# Patient Record
Sex: Male | Born: 1945 | Race: White | Hispanic: No | Marital: Married | Smoking: Former smoker
Health system: Southern US, Community
[De-identification: ages and names within clinical notes are randomized; demographics above are authoritative.]

## PROBLEM LIST (undated history)

## (undated) DIAGNOSIS — E785 Hyperlipidemia, unspecified: Secondary | ICD-10-CM

## (undated) DIAGNOSIS — F32A Depression, unspecified: Secondary | ICD-10-CM

## (undated) DIAGNOSIS — K219 Gastro-esophageal reflux disease without esophagitis: Secondary | ICD-10-CM

## (undated) DIAGNOSIS — J45909 Unspecified asthma, uncomplicated: Secondary | ICD-10-CM

## (undated) DIAGNOSIS — K449 Diaphragmatic hernia without obstruction or gangrene: Secondary | ICD-10-CM

## (undated) DIAGNOSIS — Z86010 Personal history of colonic polyps: Secondary | ICD-10-CM

## (undated) DIAGNOSIS — K589 Irritable bowel syndrome without diarrhea: Secondary | ICD-10-CM

## (undated) DIAGNOSIS — M797 Fibromyalgia: Secondary | ICD-10-CM

## (undated) DIAGNOSIS — Z8669 Personal history of other diseases of the nervous system and sense organs: Secondary | ICD-10-CM

## (undated) DIAGNOSIS — R011 Cardiac murmur, unspecified: Secondary | ICD-10-CM

## (undated) DIAGNOSIS — F419 Anxiety disorder, unspecified: Secondary | ICD-10-CM

## (undated) DIAGNOSIS — M1990 Unspecified osteoarthritis, unspecified site: Secondary | ICD-10-CM

## (undated) DIAGNOSIS — I10 Essential (primary) hypertension: Secondary | ICD-10-CM

## (undated) DIAGNOSIS — H269 Unspecified cataract: Secondary | ICD-10-CM

## (undated) DIAGNOSIS — G510 Bell's palsy: Secondary | ICD-10-CM

## (undated) DIAGNOSIS — F329 Major depressive disorder, single episode, unspecified: Secondary | ICD-10-CM

## (undated) HISTORY — DX: Essential (primary) hypertension: I10

## (undated) HISTORY — DX: Unspecified asthma, uncomplicated: J45.909

## (undated) HISTORY — DX: Gastro-esophageal reflux disease without esophagitis: K21.9

## (undated) HISTORY — DX: Personal history of colonic polyps: Z86.010

## (undated) HISTORY — DX: Anxiety disorder, unspecified: F41.9

## (undated) HISTORY — PX: CHOLECYSTECTOMY: SHX55

## (undated) HISTORY — PX: OTHER SURGICAL HISTORY: SHX169

## (undated) HISTORY — DX: Irritable bowel syndrome, unspecified: K58.9

## (undated) HISTORY — DX: Personal history of other diseases of the nervous system and sense organs: Z86.69

## (undated) HISTORY — DX: Unspecified osteoarthritis, unspecified site: M19.90

## (undated) HISTORY — DX: Hyperlipidemia, unspecified: E78.5

## (undated) HISTORY — DX: Fibromyalgia: M79.7

## (undated) HISTORY — DX: Bell's palsy: G51.0

## (undated) HISTORY — DX: Diaphragmatic hernia without obstruction or gangrene: K44.9

## (undated) HISTORY — DX: Depression, unspecified: F32.A

## (undated) HISTORY — DX: Unspecified cataract: H26.9

---
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
## 1999-08-13 ENCOUNTER — Encounter (INDEPENDENT_AMBULATORY_CARE_PROVIDER_SITE_OTHER): Payer: Self-pay | Admitting: Internal Medicine

## 1999-08-13 LAB — CONVERTED CEMR LAB: PSA: 0.7 ng/mL

## 2000-10-10 ENCOUNTER — Encounter (INDEPENDENT_AMBULATORY_CARE_PROVIDER_SITE_OTHER): Payer: Self-pay | Admitting: Internal Medicine

## 2000-10-10 LAB — CONVERTED CEMR LAB: PSA: 0.6 ng/mL

## 2002-07-12 DIAGNOSIS — Z860101 Personal history of adenomatous and serrated colon polyps: Secondary | ICD-10-CM

## 2002-07-12 DIAGNOSIS — Z86010 Personal history of colonic polyps: Secondary | ICD-10-CM

## 2002-07-12 HISTORY — DX: Personal history of adenomatous and serrated colon polyps: Z86.0101

## 2002-07-12 HISTORY — DX: Personal history of colonic polyps: Z86.010

## 2004-02-10 ENCOUNTER — Encounter (INDEPENDENT_AMBULATORY_CARE_PROVIDER_SITE_OTHER): Payer: Self-pay | Admitting: Internal Medicine

## 2004-02-10 LAB — CONVERTED CEMR LAB: PSA: 0.9 ng/mL

## 2004-05-18 ENCOUNTER — Ambulatory Visit: Payer: Self-pay | Admitting: Family Medicine

## 2004-09-16 ENCOUNTER — Ambulatory Visit: Payer: Self-pay | Admitting: Family Medicine

## 2004-10-30 ENCOUNTER — Ambulatory Visit: Payer: Self-pay | Admitting: Family Medicine

## 2004-11-02 ENCOUNTER — Ambulatory Visit: Payer: Self-pay | Admitting: Family Medicine

## 2004-11-09 ENCOUNTER — Ambulatory Visit: Payer: Self-pay | Admitting: Family Medicine

## 2005-06-11 ENCOUNTER — Encounter (INDEPENDENT_AMBULATORY_CARE_PROVIDER_SITE_OTHER): Payer: Self-pay | Admitting: Internal Medicine

## 2005-06-11 LAB — CONVERTED CEMR LAB: PSA: 1.12 ng/mL

## 2005-06-23 ENCOUNTER — Ambulatory Visit: Payer: Self-pay | Admitting: Family Medicine

## 2005-06-25 ENCOUNTER — Ambulatory Visit: Payer: Self-pay | Admitting: Family Medicine

## 2005-07-28 ENCOUNTER — Ambulatory Visit: Payer: Self-pay | Admitting: Family Medicine

## 2005-08-25 ENCOUNTER — Ambulatory Visit: Payer: Self-pay | Admitting: Family Medicine

## 2005-09-17 ENCOUNTER — Ambulatory Visit: Payer: Self-pay | Admitting: Internal Medicine

## 2005-09-23 ENCOUNTER — Ambulatory Visit: Payer: Self-pay | Admitting: Otolaryngology

## 2006-03-21 ENCOUNTER — Ambulatory Visit: Payer: Self-pay | Admitting: Family Medicine

## 2006-03-30 ENCOUNTER — Ambulatory Visit: Payer: Self-pay | Admitting: Family Medicine

## 2006-04-11 ENCOUNTER — Ambulatory Visit: Payer: Self-pay | Admitting: Internal Medicine

## 2006-07-20 ENCOUNTER — Ambulatory Visit: Payer: Self-pay | Admitting: Family Medicine

## 2006-09-10 ENCOUNTER — Encounter (INDEPENDENT_AMBULATORY_CARE_PROVIDER_SITE_OTHER): Payer: Self-pay | Admitting: Internal Medicine

## 2006-09-10 LAB — CONVERTED CEMR LAB: PSA: NORMAL ng/mL

## 2006-09-12 ENCOUNTER — Ambulatory Visit: Payer: Self-pay | Admitting: Family Medicine

## 2006-09-20 ENCOUNTER — Ambulatory Visit: Payer: Self-pay | Admitting: Family Medicine

## 2006-09-21 ENCOUNTER — Ambulatory Visit: Payer: Self-pay | Admitting: Family Medicine

## 2006-09-21 LAB — CONVERTED CEMR LAB
Calcium: 9.3 mg/dL (ref 8.4–10.5)
Chloride: 102 meq/L (ref 96–112)
Creatinine, Ser: 0.8 mg/dL (ref 0.4–1.5)
GFR calc Af Amer: 126 mL/min
GFR calc non Af Amer: 104 mL/min
Potassium: 4.3 meq/L (ref 3.5–5.1)
Sodium: 141 meq/L (ref 135–145)

## 2006-09-27 ENCOUNTER — Encounter (INDEPENDENT_AMBULATORY_CARE_PROVIDER_SITE_OTHER): Payer: Self-pay | Admitting: Internal Medicine

## 2006-09-27 DIAGNOSIS — G510 Bell's palsy: Secondary | ICD-10-CM

## 2006-09-27 DIAGNOSIS — E785 Hyperlipidemia, unspecified: Secondary | ICD-10-CM

## 2006-09-27 DIAGNOSIS — G4733 Obstructive sleep apnea (adult) (pediatric): Secondary | ICD-10-CM | POA: Insufficient documentation

## 2006-09-27 DIAGNOSIS — M19049 Primary osteoarthritis, unspecified hand: Secondary | ICD-10-CM | POA: Insufficient documentation

## 2006-09-27 DIAGNOSIS — Z860100 Personal history of colon polyps, unspecified: Secondary | ICD-10-CM | POA: Insufficient documentation

## 2006-09-27 DIAGNOSIS — I10 Essential (primary) hypertension: Secondary | ICD-10-CM | POA: Insufficient documentation

## 2006-09-27 DIAGNOSIS — J309 Allergic rhinitis, unspecified: Secondary | ICD-10-CM | POA: Insufficient documentation

## 2006-09-27 DIAGNOSIS — F528 Other sexual dysfunction not due to a substance or known physiological condition: Secondary | ICD-10-CM | POA: Insufficient documentation

## 2006-09-27 DIAGNOSIS — G4730 Sleep apnea, unspecified: Secondary | ICD-10-CM | POA: Insufficient documentation

## 2006-09-27 DIAGNOSIS — K219 Gastro-esophageal reflux disease without esophagitis: Secondary | ICD-10-CM

## 2006-09-27 HISTORY — DX: Hyperlipidemia, unspecified: E78.5

## 2006-09-27 HISTORY — DX: Primary osteoarthritis, unspecified hand: M19.049

## 2006-09-27 HISTORY — DX: Obstructive sleep apnea (adult) (pediatric): G47.33

## 2006-10-10 ENCOUNTER — Ambulatory Visit: Payer: Self-pay | Admitting: Family Medicine

## 2007-02-02 ENCOUNTER — Encounter: Payer: Self-pay | Admitting: Physician Assistant

## 2007-02-02 ENCOUNTER — Ambulatory Visit: Payer: Self-pay | Admitting: Family Medicine

## 2007-02-02 DIAGNOSIS — M542 Cervicalgia: Secondary | ICD-10-CM

## 2007-02-02 DIAGNOSIS — M25519 Pain in unspecified shoulder: Secondary | ICD-10-CM

## 2007-06-02 ENCOUNTER — Ambulatory Visit: Payer: Self-pay | Admitting: Family Medicine

## 2007-06-05 ENCOUNTER — Encounter (INDEPENDENT_AMBULATORY_CARE_PROVIDER_SITE_OTHER): Payer: Self-pay | Admitting: Internal Medicine

## 2007-06-06 LAB — CONVERTED CEMR LAB
AST: 19 units/L (ref 0–37)
BUN: 12 mg/dL (ref 6–23)
CO2: 32 meq/L (ref 19–32)
Calcium: 9.6 mg/dL (ref 8.4–10.5)
Chloride: 105 meq/L (ref 96–112)
Creatinine, Ser: 0.8 mg/dL (ref 0.4–1.5)
Direct LDL: 163 mg/dL
HDL: 41.2 mg/dL (ref >39.0)

## 2007-06-27 ENCOUNTER — Ambulatory Visit: Payer: Self-pay | Admitting: Family Medicine

## 2007-06-27 DIAGNOSIS — L259 Unspecified contact dermatitis, unspecified cause: Secondary | ICD-10-CM | POA: Insufficient documentation

## 2007-07-19 ENCOUNTER — Ambulatory Visit: Payer: Self-pay | Admitting: Internal Medicine

## 2007-07-24 LAB — CONVERTED CEMR LAB
ALT: 18 units/L (ref 0–53)
AST: 20 units/L (ref 0–37)
Bilirubin, Direct: 0.1 mg/dL (ref 0.0–0.3)
Cholesterol: 171 mg/dL (ref 0–200)
HDL: 43.4 mg/dL (ref >39.0)
Total Protein: 6.9 g/dL (ref 6.0–8.3)

## 2007-07-28 ENCOUNTER — Ambulatory Visit: Payer: Self-pay | Admitting: Gastroenterology

## 2007-08-24 ENCOUNTER — Ambulatory Visit: Payer: Self-pay | Admitting: Gastroenterology

## 2007-08-24 ENCOUNTER — Encounter (INDEPENDENT_AMBULATORY_CARE_PROVIDER_SITE_OTHER): Payer: Self-pay | Admitting: Internal Medicine

## 2007-08-24 LAB — HM COLONOSCOPY: HM Colonoscopy: ABNORMAL

## 2007-10-26 ENCOUNTER — Ambulatory Visit: Payer: Self-pay | Admitting: Gastroenterology

## 2007-10-30 ENCOUNTER — Ambulatory Visit: Payer: Self-pay | Admitting: Cardiology

## 2007-11-13 ENCOUNTER — Telehealth: Payer: Self-pay | Admitting: Gastroenterology

## 2007-11-19 ENCOUNTER — Encounter: Admission: RE | Admit: 2007-11-19 | Discharge: 2007-11-19 | Payer: Self-pay | Admitting: Gastroenterology

## 2008-01-31 ENCOUNTER — Ambulatory Visit: Payer: Self-pay | Admitting: Family Medicine

## 2008-02-05 LAB — CONVERTED CEMR LAB
AST: 19 units/L (ref 0–37)
Chloride: 104 meq/L (ref 96–112)
Creatinine, Ser: 0.9 mg/dL (ref 0.4–1.5)
GFR calc Af Amer: 110 mL/min
GFR calc non Af Amer: 91 mL/min
LDL Cholesterol: 105 mg/dL — ABNORMAL HIGH (ref 0–99)
Potassium: 4.5 meq/L (ref 3.5–5.1)
Total CHOL/HDL Ratio: 3.8
Triglycerides: 97 mg/dL (ref 0–149)
VLDL: 19 mg/dL (ref 0–40)

## 2008-03-29 ENCOUNTER — Ambulatory Visit: Payer: Self-pay | Admitting: Family Medicine

## 2008-03-29 DIAGNOSIS — R109 Unspecified abdominal pain: Secondary | ICD-10-CM | POA: Insufficient documentation

## 2008-03-29 DIAGNOSIS — R55 Syncope and collapse: Secondary | ICD-10-CM | POA: Insufficient documentation

## 2008-04-09 ENCOUNTER — Ambulatory Visit: Payer: Self-pay | Admitting: Family Medicine

## 2008-04-09 ENCOUNTER — Telehealth: Payer: Self-pay | Admitting: Family Medicine

## 2008-04-09 DIAGNOSIS — A048 Other specified bacterial intestinal infections: Secondary | ICD-10-CM | POA: Insufficient documentation

## 2008-04-24 ENCOUNTER — Ambulatory Visit: Payer: Self-pay | Admitting: Gastroenterology

## 2008-04-24 DIAGNOSIS — K589 Irritable bowel syndrome without diarrhea: Secondary | ICD-10-CM | POA: Insufficient documentation

## 2008-04-24 DIAGNOSIS — R933 Abnormal findings on diagnostic imaging of other parts of digestive tract: Secondary | ICD-10-CM | POA: Insufficient documentation

## 2008-04-25 ENCOUNTER — Telehealth: Payer: Self-pay | Admitting: Gastroenterology

## 2008-04-26 ENCOUNTER — Encounter (INDEPENDENT_AMBULATORY_CARE_PROVIDER_SITE_OTHER): Payer: Self-pay | Admitting: Internal Medicine

## 2008-05-02 ENCOUNTER — Ambulatory Visit: Payer: Self-pay | Admitting: Cardiology

## 2008-06-10 ENCOUNTER — Ambulatory Visit: Payer: Self-pay | Admitting: Gastroenterology

## 2008-06-27 ENCOUNTER — Ambulatory Visit: Payer: Self-pay | Admitting: Family Medicine

## 2008-06-27 DIAGNOSIS — K649 Unspecified hemorrhoids: Secondary | ICD-10-CM | POA: Insufficient documentation

## 2008-06-27 HISTORY — DX: Unspecified hemorrhoids: K64.9

## 2008-07-03 LAB — CONVERTED CEMR LAB
ALT: 17 units/L (ref 0–53)
AST: 19 units/L (ref 0–37)
BUN: 14 mg/dL (ref 6–23)
Chloride: 106 meq/L (ref 96–112)
Cholesterol: 181 mg/dL (ref 0–200)
GFR calc Af Amer: 126 mL/min
GFR calc non Af Amer: 104 mL/min
LDL Cholesterol: 116 mg/dL — ABNORMAL HIGH (ref 0–99)
Potassium: 4.8 meq/L (ref 3.5–5.1)
Sodium: 141 meq/L (ref 135–145)

## 2008-09-13 ENCOUNTER — Emergency Department: Payer: Self-pay | Admitting: Emergency Medicine

## 2008-09-27 ENCOUNTER — Encounter (INDEPENDENT_AMBULATORY_CARE_PROVIDER_SITE_OTHER): Payer: Self-pay | Admitting: Internal Medicine

## 2008-11-06 ENCOUNTER — Encounter (INDEPENDENT_AMBULATORY_CARE_PROVIDER_SITE_OTHER): Payer: Self-pay | Admitting: Internal Medicine

## 2008-11-19 ENCOUNTER — Ambulatory Visit: Payer: Self-pay | Admitting: Family Medicine

## 2008-11-19 DIAGNOSIS — H6980 Other specified disorders of Eustachian tube, unspecified ear: Secondary | ICD-10-CM

## 2008-11-25 LAB — CONVERTED CEMR LAB
Calcium: 9.6 mg/dL (ref 8.4–10.5)
Cholesterol: 176 mg/dL (ref 0–200)
GFR calc non Af Amer: 103.39 mL/min (ref >60)
Glucose, Bld: 95 mg/dL (ref 70–99)
HDL: 46.8 mg/dL (ref >39.00)
Potassium: 4.6 meq/L (ref 3.5–5.1)
Sodium: 143 meq/L (ref 135–145)
Triglycerides: 72 mg/dL (ref 0.0–149.0)
VLDL: 14.4 mg/dL (ref 0.0–40.0)

## 2009-03-04 ENCOUNTER — Ambulatory Visit: Payer: Self-pay | Admitting: Family Medicine

## 2009-03-04 ENCOUNTER — Encounter (INDEPENDENT_AMBULATORY_CARE_PROVIDER_SITE_OTHER): Payer: Self-pay | Admitting: Internal Medicine

## 2009-03-04 DIAGNOSIS — R5381 Other malaise: Secondary | ICD-10-CM | POA: Insufficient documentation

## 2009-03-04 DIAGNOSIS — R5383 Other fatigue: Secondary | ICD-10-CM

## 2009-03-05 LAB — CONVERTED CEMR LAB
Basophils Relative: 0.7 % (ref 0.0–3.0)
Eosinophils Absolute: 0.4 10*3/uL (ref 0.0–0.7)
Eosinophils Relative: 6.4 % — ABNORMAL HIGH (ref 0.0–5.0)
HCT: 42.7 % (ref 39.0–52.0)
Hemoglobin: 14.5 g/dL (ref 13.0–17.0)
MCHC: 34 g/dL (ref 30.0–36.0)
MCV: 88.4 fL (ref 78.0–100.0)
Monocytes Absolute: 0.6 10*3/uL (ref 0.1–1.0)
Neutro Abs: 3.7 10*3/uL (ref 1.4–7.7)
Neutrophils Relative %: 60.5 % (ref 43.0–77.0)
RBC: 4.83 M/uL (ref 4.22–5.81)
WBC: 6.1 10*3/uL (ref 4.5–10.5)

## 2009-03-06 LAB — CONVERTED CEMR LAB: Vit D, 25-Hydroxy: 40 ng/mL (ref 30–89)

## 2009-04-04 ENCOUNTER — Ambulatory Visit: Payer: Self-pay | Admitting: Family Medicine

## 2009-04-16 ENCOUNTER — Ambulatory Visit: Payer: Self-pay | Admitting: Family Medicine

## 2009-04-25 ENCOUNTER — Telehealth (INDEPENDENT_AMBULATORY_CARE_PROVIDER_SITE_OTHER): Payer: Self-pay | Admitting: Internal Medicine

## 2009-04-30 ENCOUNTER — Encounter (INDEPENDENT_AMBULATORY_CARE_PROVIDER_SITE_OTHER): Payer: Self-pay | Admitting: Internal Medicine

## 2009-05-06 ENCOUNTER — Telehealth (INDEPENDENT_AMBULATORY_CARE_PROVIDER_SITE_OTHER): Payer: Self-pay | Admitting: Internal Medicine

## 2009-05-16 ENCOUNTER — Encounter (INDEPENDENT_AMBULATORY_CARE_PROVIDER_SITE_OTHER): Payer: Self-pay | Admitting: *Deleted

## 2009-05-28 ENCOUNTER — Ambulatory Visit: Payer: Self-pay | Admitting: Family Medicine

## 2009-05-30 LAB — CONVERTED CEMR LAB
ALT: 21 units/L (ref 0–53)
AST: 21 units/L (ref 0–37)
Cholesterol: 165 mg/dL (ref 0–200)
HDL: 48.1 mg/dL (ref >39.00)
LDL Cholesterol: 106 mg/dL — ABNORMAL HIGH (ref 0–99)
VLDL: 11.2 mg/dL (ref 0.0–40.0)

## 2009-07-21 ENCOUNTER — Telehealth: Payer: Self-pay | Admitting: Family Medicine

## 2009-11-28 ENCOUNTER — Ambulatory Visit: Payer: Self-pay | Admitting: Family Medicine

## 2009-11-30 LAB — CONVERTED CEMR LAB
ALT: 18 units/L (ref 0–53)
AST: 22 units/L (ref 0–37)
HDL: 50.7 mg/dL (ref >39.00)
Total CHOL/HDL Ratio: 3
VLDL: 17.2 mg/dL (ref 0.0–40.0)

## 2009-12-25 ENCOUNTER — Telehealth: Payer: Self-pay | Admitting: Gastroenterology

## 2010-01-30 ENCOUNTER — Ambulatory Visit: Payer: Self-pay | Admitting: Family Medicine

## 2010-02-02 ENCOUNTER — Telehealth: Payer: Self-pay | Admitting: Family Medicine

## 2010-02-02 LAB — CONVERTED CEMR LAB
Albumin: 4.2 g/dL (ref 3.5–5.2)
BUN: 15 mg/dL (ref 6–23)
Calcium: 9.7 mg/dL (ref 8.4–10.5)
GFR calc non Af Amer: 91.08 mL/min (ref >60)
Glucose, Bld: 103 mg/dL — ABNORMAL HIGH (ref 70–99)
Total Bilirubin: 1 mg/dL (ref 0.3–1.2)

## 2010-03-11 ENCOUNTER — Telehealth: Payer: Self-pay | Admitting: Gastroenterology

## 2010-03-11 ENCOUNTER — Ambulatory Visit: Payer: Self-pay | Admitting: Family Medicine

## 2010-03-13 ENCOUNTER — Ambulatory Visit: Payer: Self-pay | Admitting: Internal Medicine

## 2010-03-13 ENCOUNTER — Encounter: Payer: Self-pay | Admitting: Physician Assistant

## 2010-03-13 DIAGNOSIS — K5900 Constipation, unspecified: Secondary | ICD-10-CM | POA: Insufficient documentation

## 2010-03-18 ENCOUNTER — Encounter (INDEPENDENT_AMBULATORY_CARE_PROVIDER_SITE_OTHER): Payer: Self-pay | Admitting: *Deleted

## 2010-03-18 LAB — CONVERTED CEMR LAB
Basophils Absolute: 0.1 10*3/uL (ref 0.0–0.1)
Basophils Relative: 1.1 % (ref 0.0–3.0)
CRP, High Sensitivity: 1.2 (ref 0.00–5.00)
Eosinophils Relative: 6.1 % — ABNORMAL HIGH (ref 0.0–5.0)
HCT: 46.6 % (ref 39.0–52.0)
Hemoglobin: 15.5 g/dL (ref 13.0–17.0)
Lymphocytes Relative: 22.5 % (ref 12.0–46.0)
Monocytes Relative: 9.2 % (ref 3.0–12.0)
Neutro Abs: 3.7 10*3/uL (ref 1.4–7.7)
RBC: 5.21 M/uL (ref 4.22–5.81)
WBC: 6 10*3/uL (ref 4.5–10.5)

## 2010-03-19 ENCOUNTER — Ambulatory Visit: Payer: Self-pay | Admitting: Gastroenterology

## 2010-03-24 ENCOUNTER — Ambulatory Visit: Payer: Self-pay | Admitting: Gastroenterology

## 2010-03-24 ENCOUNTER — Ambulatory Visit (HOSPITAL_COMMUNITY): Admission: RE | Admit: 2010-03-24 | Discharge: 2010-03-24 | Payer: Self-pay | Admitting: Gastroenterology

## 2010-03-25 ENCOUNTER — Encounter: Payer: Self-pay | Admitting: Gastroenterology

## 2010-04-21 ENCOUNTER — Ambulatory Visit: Payer: Self-pay | Admitting: Family Medicine

## 2010-04-21 DIAGNOSIS — IMO0001 Reserved for inherently not codable concepts without codable children: Secondary | ICD-10-CM | POA: Insufficient documentation

## 2010-04-22 ENCOUNTER — Ambulatory Visit: Payer: Self-pay | Admitting: Family Medicine

## 2010-04-27 ENCOUNTER — Ambulatory Visit: Payer: Self-pay | Admitting: Gastroenterology

## 2010-05-19 ENCOUNTER — Ambulatory Visit: Payer: Self-pay | Admitting: Family Medicine

## 2010-05-21 LAB — CONVERTED CEMR LAB
Direct LDL: 158.6 mg/dL
HDL: 44.3 mg/dL (ref >39.00)
Total CHOL/HDL Ratio: 5
Triglycerides: 153 mg/dL — ABNORMAL HIGH (ref 0.0–149.0)
VLDL: 30.6 mg/dL (ref 0.0–40.0)

## 2010-08-02 ENCOUNTER — Encounter: Payer: Self-pay | Admitting: Gastroenterology

## 2010-08-12 NOTE — Procedures (Signed)
Summary: Upper Endoscopy  Patient: Kyle Sanchez Note: All result statuses are Final unless otherwise noted.  Tests: (1) Upper Endoscopy (EGD)   EGD Upper Endoscopy       DONE     [facility]     [address] [place], [place]  [address]           ENDOSCOPY PROCEDURE REPORT           PATIENT:  Kyle Sanchez, Kyle Sanchez  MR#:  [id number]     BIRTHDATE:  18-Dec-1945, 64 yrs. old  GENDER:  male     ENDOSCOPIST:  Judie Petit T. Russella Dar, MD, [facility]           PROCEDURE DATE:  03/24/2010     PROCEDURE:  EGD with biopsy     ASA CLASS:  Class II     INDICATIONS:  abdominal pain, multiple sites, abnormal celiac     serologies, gerd     MEDICATIONS:  Fentanyl 50 mcg IV, Versed 4 mg IV     TOPICAL ANESTHETIC:  Cetacaine Spray     DESCRIPTION OF PROCEDURE:   After the risks benefits and     alternatives of the procedure were thoroughly explained, informed     consent was obtained.  The Pentax Gastroscope [id number] endoscope     was introduced through the mouth and advanced to the second     portion of the duodenum, without limitations.  The instrument was     slowly withdrawn as the mucosa was fully examined.     <<PROCEDUREIMAGES>>     The esophagus and gastroesophageal junction were completely normal     in appearance.  The stomach was entered and closely examined. The     plyorus, antrum, angularis, and lesser curvature were well     visualized, including a retroflexed view of the cardia and fundus.     The stomach wall was normally distensable. The scope passed easily     through the pylorus into the duodenum. The duodenal bulb was     normal in appearance, as was the postbulbar duodenum. Random     biopsies were obtained and sent to pathology. Retroflexed views     revealed no abnormalities.  The scope was then withdrawn from the     patient and the procedure completed.           COMPLICATIONS:  None           ENDOSCOPIC IMPRESSION:     1) Normal EGD           RECOMMENDATIONS:     1)  Anti-reflux regimen     2) PPI bid     3) Await pathology     4) Consider 24 hr pH study, esophageal manomentry and evaluation     for non GI causes of his symptoms as I am not convinced that he     has ongoing GERD symptoms           Mirtie Bastyr T. Russella Dar, MD, [facility]           CC:  Judy Pimple, MD           n.     Rosalie DoctorJudie Petit T. Jashiya Bassett at 03/24/2010 09:53 AM           Almeta Monas, [id number]  Note: An exclamation mark (!) indicates a result that was not dispersed into the flowsheet. Document Creation Date: 03/24/2010 9:54 AM _______________________________________________________________________  (1)  Order result status: Final Collection or observation date-time: 03/24/2010 09:06 Requested date-time:  Receipt date-time:  Reported date-time:  Referring Physician:   Ordering Physician: Claudette Head [id number]) Specimen Source:  Source: Launa Grill Order Number: [id number] Lab site:   Appended Document: Upper Endoscopy DC Prilosec Begin Dexilant 60mg  by mouth qam, #30, 11 refills

## 2010-08-12 NOTE — Assessment & Plan Note (Signed)
Summary: DISCUSS MEDS/DLO   Vital Signs:  Patient profile:   65 year old male Height:      65.5 inches Weight:      166.50 pounds BMI:     27.38 Temp:     98.4 degrees F oral Pulse rate:   68 / minute Pulse rhythm:   regular BP sitting:   140 / 84  (left arm) Cuff size:   regular  Vitals Entered By: Delilah Shan CMA Duncan Dull) (May 19, 2010 9:34 AM) CC: Discuss medications   History of Present Illness: Myalgias improved off simvastatin and on flexeril.    Still with some AM stiffness in hands, better with movement.  Hips and back get stiff if prolonged sitting.  H/o OA.  This didn't change with the trial off statin.    Still with pain on R max sinus and h/o allergies- prev on allergy shots and nasal steroids. Some relief per patient with nasal steroids.   Allergies (verified): 1)  ! Zocor  Review of Systems       See HPI.  Otherwise negative.    Physical Exam  General:  GEN: nad, alert and oriented HEENT: mucous membranes moist, tm wnl, max sinus slightly tender to palpation, nasal epithelium injected NECK: no LA CV: rrr.  no murmur PULM: ctab, no inc wob ABD: soft, +bs EXT: no edema SKIN: no acute rash  No active synovitis on the wrist or elbows.  chronic changes to IP joints bilaterally in hands   Impression & Recommendations:  Problem # 1:  HYPERLIPIDEMIA (ICD-272.4) Hold statin, see notes on labs.   His updated medication list for this problem includes:    Zocor 20 Mg Tabs (Simvastatin) .[name] Kitchen... Take 1 tablet by mouth once a day- held as of 04/21/10  Orders: TLB-Lipid Panel (80061-LIPID)  Problem # 2:  ALLERGIC RHINITIS (ICD-477.9) Start flonase and report back as needed.  His updated medication list for this problem includes:    Flonase 50 Mcg/act Susp (Fluticasone propionate) .[name] Kitchen... 2 sprays per nostril per day  Problem # 3:  OSTEOARTHROSIS, GENERALIZED, UNSPC SITE (ICD-715.00) Continue APAP for now.   His updated medication list for this problem  includes:    Adult Aspirin Ec Low Strength 81 Mg Tbec (Aspirin) .[name] Kitchen... Take 1 tablet by mouth once a day (on hold)  Complete Medication List: 1)  Adult Aspirin Ec Low Strength 81 Mg Tbec (Aspirin) .... Take 1 tablet by mouth once a day (on hold) 2)  Zocor 20 Mg Tabs (Simvastatin) .... Take 1 tablet by mouth once a day- held as of 04/21/10 3)  Multivitamins Tabs (Multiple vitamin) .... One daily 4)  Vitamin B Complex-c Caps (B complex-c) .... One daily 5)  Fish Oil 1000 Mg Caps (Omega-3 fatty acids) .[name] Kitchen.. 1 by mouth once daily 6)  Vitamin D 1000 Unit Tabs (Cholecalciferol) .... One daily 7)  Omeprazole 40 Mg Cpdr (Omeprazole) .[name] Kitchen.. 1 by mouth two times a day 8)  Flonase 50 Mcg/act Susp (Fluticasone propionate) .... 2 sprays per nostril per day  Patient Instructions: 1)  You can get your results through our phone system.  Follow the instructions on the blue card. I would continue to stretch for your back and take the tylenol as needed.  I would use the nose spray in the meantime.  Let me know if you aren't improving.   Prescriptions: FLONASE 50 MCG/ACT SUSP (FLUTICASONE PROPIONATE) 2 sprays per nostril per day  #1 x 12   Entered and Authorized  by:   Crawford Givens MD   Signed by:   Crawford Givens MD on 05/19/2010   Method used:   Electronically to        [facility] (retail)       [address] [place], [place]  [address]       Ph: [phone]       Fax: [phone]   RxID:   [id number]    Orders Added: 1)  Est. Patient Level IV [38756] 2)  TLB-Lipid Panel [80061-LIPID]    Current Allergies (reviewed today): ! ZOCOR

## 2010-08-12 NOTE — Progress Notes (Signed)
Summary: Schedule Office Visit   Phone Note Outgoing Call Call back at [facility] Phone [phone]   Call placed by: Harlow Mares CMA Duncan Dull),  December 25, 2009 1:39 PM Call placed to: Patient Summary of Call: Left message on patients machine to call back. patient needs to call back and make an appt for an office visit.  Initial call taken by: Harlow Mares CMA Duncan Dull),  December 25, 2009 1:40 PM  Follow-up for Phone Call        pt due for office visit we will mail him a letter to remind him he is due.  Follow-up by: Harlow Mares CMA Duncan Dull),  January 15, 2010 2:59 PM

## 2010-08-12 NOTE — Letter (Signed)
Summary: EGD Instructions  [facility]  [address] [place], [place] [address]   Phone: [phone]  Fax: [phone]       Kyle Sanchez    06/15/1946    MRN: [id number]       Procedure Day Dorna Bloom:   [name]  03/24/10     Arrival Time:    7:30AM     Procedure Time:  8:30AM     Location of Procedure:                     _ X _ [facility] ( Outpatient Registration)   PREPARATION FOR ENDOSCOPY   On 03/24/10 THE DAY OF THE PROCEDURE:  1.   No solid foods, milk or milk products are allowed after midnight the night before your procedure.  2.   Do not drink anything colored red or purple.  Avoid juices with pulp.  No orange juice.  3.  You may drink clear liquids until 4:30AM, which is 4 hours before your procedure.                                                                                                CLEAR LIQUIDS INCLUDE: Water Jello Ice Popsicles Tea (sugar ok, no milk/cream) Powdered fruit flavored drinks Coffee (sugar ok, no milk/cream) Gatorade Juice: apple, white grape, white cranberry  Lemonade Clear bullion, consomm, broth Carbonated beverages (any kind) Strained chicken noodle soup Hard Candy   MEDICATION INSTRUCTIONS  Unless otherwise instructed, you should take regular prescription medications with a small sip of water as early as possible the morning of your procedure.               OTHER INSTRUCTIONS  You will need a responsible adult at least 66 years of age to accompany you and drive you home.   This person must remain in the waiting room during your procedure.  Wear loose fitting clothing that is easily removed.  Leave jewelry and other valuables at home.  However, you may wish to bring a book to read or an iPod/MP3 player to listen to music as you wait for your procedure to start.  Remove all body piercing jewelry and leave at home.  Total time from sign-in until discharge is approximately 2-3 hours.  You  should go home directly after your procedure and rest.  You can resume normal activities the day after your procedure.  The day of your procedure you should not:   Drive   Make legal decisions   Operate machinery   Drink alcohol   Return to work  You will receive specific instructions about eating, activities and medications before you leave.    The above instructions have been reviewed and explained to me by   Karl Bales RN  March 19, 2010 11:43 AM    I fully understand and can verbalize these instructions _____________________________ Date _________

## 2010-08-12 NOTE — Miscellaneous (Signed)
Summary: [facility] previsit  Clinical Lists Changes 

## 2010-08-12 NOTE — Progress Notes (Signed)
Summary: triage   Phone Note From Other Clinic Call back at [phone]   Caller: Shirlee Limerick Call For: Dr. Russella Dar Reason for Call: Schedule Patient Appt Summary of Call: needs appt asap per [facility] for severe GERD and abd pain... meds not helping Initial call taken by: Vallarie Mare,  March 11, 2010 9:52 AM  Follow-up for Phone Call        Patient  is scheduled to see Mike Gip PA 03/13/10 10:00.  I have left a voicemail with Shirlee Limerick with the appointment details. Follow-up by: Darcey Nora RN, CGRN,  March 11, 2010 10:36 AM

## 2010-08-12 NOTE — Assessment & Plan Note (Signed)
Summary: cpx/alc   Vital Signs:  Patient profile:   65 year old male Height:      66.5 inches Weight:      166.75 pounds BMI:     26.61 Temp:     98.5 degrees F oral Pulse rate:   76 / minute Pulse rhythm:   regular BP sitting:   146 / 84  (left arm) Cuff size:   regular  Vitals Entered By: Delilah Shan CMA Kamaiyah Uselton Dull) (Feb 28, 2010 8:41 AM) CC: CPX - New Medicare, Preventive Care  Vision Screening:Left eye with correction: 20 / 40 Right eye with correction: 20 / 25 Both eyes with correction: 20 / 25        Vision Entered By: Delilah Shan CMA Arra Connaughton Dull) (02/28/10 8:56 AM)  Hearing Screen 25db HL: Left  500 hz: 25db 1000 hz: 25db 2000 hz: 25db 4000 hz: 25db Right  500 hz: 25db 1000 hz: 25db 2000 hz: 25db 4000 hz: No Response    History of Present Illness: Here for Medicare AWV:  1.   Risk factors based on Past M, S, F history: Reviewed, see below 2.   Physical Activities: Walking 3 miles a day and walking on golf course 3.   Depression/mood: stable, no symptoms of depression, bright affect 4.   Hearing: no sig deficit 5.   ADL's: independent 6.   Fall Risk: no fall risk 7.   Home Safety: safe at home 8.   Height, weight, &visual acuity: as below 9.   Counseling: See plan 10.   Labs ordered based on risk factors: See below 11.           Referral Coordination: as below as indicated 12.           Care Plan; see plan 13.            Cognitive Assessment- intact.  no deficit  Hypertension: off meds, doing fine and BP has been controlled.  No CP/SOB/BLE edema  GERD- controlled with current meds.  Prev EGD done.  Followed by Dr. Russella Dar.  No vomiting.  If missed done, then symptoms return quickly with burning.  Elevated Cholesterol: Using medications without problems:yes Muscle aches: no Other complaints: no  Current Medications (verified): 1)  Adult Aspirin Ec Low Strength 81 Mg  Tbec (Aspirin) .... Take 1 Tablet By Mouth Once A Day 2)  Zocor 20 Mg  Tabs  (Simvastatin) .... Take 1 Tablet By Mouth Once A Day 3)  Multivitamins   Tabs (Multiple Vitamin) .... One Daily 4)  Vitamin B Complex-C   Caps (B Complex-C) .... One Daily 5)  Fish Oil   Oil (Fish Oil) .... Two Daily 6)  Vitamin D 1000 Unit  Tabs (Cholecalciferol) .... One Daily 7)  Vitamin E 600 Unit  Caps (Vitamin E) .... One Daily 8)  Omeprazole 20 Mg Cpdr (Omeprazole) .... 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 Osteoarthritis Bell's palsy Allergic rhinitis  Past Surgical History: Last updated: 11/06/2008 Cholecystectomy Carotid doppler, nl---2/00 EGD 8/96, 12/02--gastritis--2/09--GERD Nasal septal repair--2007 colonoscopy--08/02/02--polyps and internal hemorrhoids--repeat 08/24/07-internal hemorrhoids  Family History: Last updated: 02/28/10 Father: died @56  EtOh cirrosis Mother: dead Kidney stones, arthritis, CVA  Siblings:  7 bros --1 died @52  "liver"  6 sisters--1 @65  CVA, Hypertension all sibs have depression No FH of Colon Cancer: Family History of Prostate Cancer: Brother Family History of Colon Polyps: Brother  Social History: Last updated: 01/30/2010 Marital Status: Married 1966, lives with wife Children: 2 sons 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 Alcohol Use - yes-occasionally From [place] since 1995  Risk Factors: Alcohol Use: 1 (06/27/2007) Caffeine Use: 1 (06/27/2007) Exercise: yes (06/27/2007)  Risk Factors: Smoking Status: quit (09/27/2006) Passive Smoke Exposure: no (06/27/2007)  Family History: Father: died @56  EtOh cirrosis Mother: dead Kidney stones, arthritis, CVA  Siblings:  7 bros --1 died @52  "liver"                                                6 sisters--1 @65  CVA, Hypertension all sibs have depression No FH of Colon Cancer: Family History of Prostate Cancer: Brother Family History of Colon Polyps: Brother  Social History: Marital Status: Married 1966, lives with wife Children: 2 sons 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  Otherwise noncontributory.    Physical Exam  General:  GEN: nad, alert and oriented HEENT: mucous membranes moist, nasal epithelium minimally injected. OP wnl o/w NECK: supple w/o LA CV: rrr.  no murmur PULM: ctab, no inc wob ABD: soft, +bs EXT: no edema, chronic changes to IP joints noted on bilateral hands SKIN: no acute rash  Prostate:  Prostate gland firm and smooth, no enlargement, nodularity, tenderness, mass, asymmetry or induration. stool heme neg.     Impression & Recommendations:  Problem # 1:  Preventive Health Care (ICD-V70.0) See labs.  Stable based on exam today.  Well appearing.  Flu shot encouraged.  Pt to check on coverage for zostavax.  PNA vaccine today.  Td up to date.  Colonoscopy up to date.   Problem # 2:  SPECIAL SCREENING MALIGNANT NEOPLASM OF PROSTATE (ICD-V76.44) D/w patient re: false +/- and will order today.  He understands need for work up if pos.  Orders: TLB-PSA (Prostate Specific Antigen) (84153-PSA)  Problem # 3:  HYPERLIPIDEMIA (ICD-272.4) No change in meds.  check labs.  His updated medication list for this problem includes:    Zocor 20 Mg Tabs (Simvastatin) .[name] Kitchen... Take 1 tablet by mouth once a day  Orders: TLB-BMP (Basic Metabolic Panel-BMET) (80048-METABOL) TLB-Hepatic/Liver Function Pnl (80076-HEPATIC)  Problem # 4:  GERD (ICD-530.81) No change in meds.  patient already with lifestyle mods done.  Has had prev GI work up.  The following medications were removed from the medication list:    Dexilant 60 Mg  Cpdr (Dexlansoprazole) .[name] Kitchen... 1 each morning 30-60 min before more food or fluids His updated medication list for this problem includes:    Omeprazole 20 Mg Cpdr (Omeprazole) .[name] Kitchen... Take 1 tablet by mouth two times a day  Complete Medication List: 1)  Adult Aspirin Ec Low Strength 81 Mg Tbec (Aspirin) .... Take 1 tablet by mouth once a day 2)  Zocor 20 Mg Tabs (Simvastatin) .... Take 1 tablet by mouth once a day 3)  Multivitamins Tabs (Multiple vitamin) .... One daily 4)  Vitamin B Complex-c Caps (B complex-c) .... One daily 5)  Fish Oil Oil (Fish oil) .... Two daily 6)  Vitamin D 1000 Unit Tabs (Cholecalciferol) .... One daily 7)  Vitamin E 600 Unit Caps (Vitamin e) .... One daily 8)  Omeprazole 20 Mg Cpdr (Omeprazole) .... Take 1 tablet by mouth two times a day  Other Orders: Pneumococcal Vaccine (16109) Admin 1st Vaccine (60454)  Colorectal Screening:  Current Recommendations:    Hemoccult: NEG X 1 today  PSA Screening:    PSA: 0.97  (11/19/2008)  Immunization & Chemoprophylaxis:    Tetanus vaccine: Td  (06/11/2005)    Influenza vaccine: Fluvax 3+  (04/16/2009)    Pneumovax: Pneumovax (Medicare)  (01/30/2010)  Patient Instructions: 1)  We'll contact you with your lab report. Check with your insurance to see if they will cover the shingles shot.  Let me know if you have any other problems.  I was glad to see you today.  Keep exercising.  Prescriptions: OMEPRAZOLE 20 MG CPDR (OMEPRAZOLE) Take 1 tablet by mouth two times a day  #180 x 3   Entered and Authorized by:   Crawford Givens MD   Signed by:   Crawford Givens MD on 01/30/2010   Method used:   Print then Give to Patient   RxID:   [id number] ZOCOR 20 MG  TABS (SIMVASTATIN) Take 1 tablet by mouth once a day  #90 x 3   Entered and Authorized by:   Crawford Givens MD   Signed by:   Crawford Givens MD on 01/30/2010   Method used:   Print then Give to Patient   RxID:   [id number]   Current Allergies (reviewed  today): No known allergies    Immunizations Administered:  Pneumonia Vaccine:    Vaccine Type: Pneumovax (Medicare)    Site: left deltoid    Mfr: Merck    Dose: 0.5 ml    Route: IM    Given by: Delilah Shan CMA (AAMA)    Exp. Date: 07/11/2011    Lot #: [id number]    VIS given: 02/07/96 version given January 30, 2010.    Prevention & Chronic Care Immunizations   Influenza vaccine: Fluvax 3+  (04/16/2009)   Influenza vaccine due: 03/12/2010    Tetanus booster: 06/11/2005: Td    Pneumococcal vaccine: Pneumovax (Medicare)  (01/30/2010)    H. zoster vaccine: Not documented  Colorectal Screening   Hemoccult: Not documented   Hemoccult action/deferral: NEG X 1 today  (01/30/2010)    Colonoscopy: abnormal  (08/24/2007)  Other Screening   PSA: 0.97  (11/19/2008)   PSA ordered.   Smoking status: quit  (09/27/2006)  Lipids   Total Cholesterol: 176  (11/28/2009)   LDL: 108  (11/28/2009)   LDL Direct: 163.0  (06/02/2007)   HDL: 50.70  (11/28/2009)   Triglycerides: 86.0  (11/28/2009)    SGOT (AST): 22  (11/28/2009)   SGPT (ALT): 18  (11/28/2009)   Alkaline phosphatase: 65  (07/19/2007)   Total bilirubin: 0.9  (07/19/2007)    Lipid flowsheet reviewed?: Yes  Hypertension   Last Blood Pressure: 146 / 84  (01/30/2010)   Serum creatinine: 0.8  (11/19/2008)   Serum potassium 4.6  (11/19/2008)    Hypertension flowsheet reviewed?: Yes  Self-Management Support :    Hypertension self-management support: Not documented    Lipid self-management support: Not documented

## 2010-08-12 NOTE — Assessment & Plan Note (Signed)
Summary: STOMACH PROBLEMS/DLO   Vital Signs:  Patient profile:   65 year old male Height:      66.5 inches Weight:      165.75 pounds BMI:     26.45 Temp:     98.1 degrees F oral Pulse rate:   64 / minute Pulse rhythm:   regular BP sitting:   128 / 80  (left arm) Cuff size:   regular  Vitals Entered By: Lewanda Rife LPN (March 11, 2010 9:20 AM) CC: stomach problem with acid reflux   History of Present Illness: is having problems with acid reflux  has worked on lifestyle change - with different diet - and that has not helped   has seen GI and had EGD 4 times  no medicines worked  last on dexliant  has been on all H2 blockers and PPIs   does better with antiacid otc  on omeprazole   gets burning pain into his chest  tends to get headache at same time   has had severe GERD since age 62   also has chronic bowel symptoms  has ibs wonders about celiac        Allergies (verified): No Known Drug Allergies  Past History:  Past Medical History: Last updated: 04/24/2008 GERD Hyperlipidemia Hypertension Hemorrhoids Irritable Bowel Syndrome 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 Osteoarthritis Bell's palsy Allergic rhinitis  Past Surgical History: Last updated: 11/06/2008 Cholecystectomy Carotid doppler, nl---2/00 EGD 8/96, 12/02--gastritis--2/09--GERD Nasal septal repair--2007 colonoscopy--08/02/02--polyps and internal hemorrhoids--repeat 08/24/07-internal hemorrhoids  Family History: Last updated: 02-19-2010 Father: died @56  EtOh cirrosis Mother: dead Kidney stones, arthritis, CVA  Siblings:  7 bros --1 died @52  "liver"                                               6 sisters--1 @65  CVA, Hypertension all sibs have depression No FH of Colon Cancer: Family History of Prostate Cancer: Brother Family History of Colon Polyps: Brother  Social History: Last updated: 2010/02/19 Marital Status: Married 1966, lives with wife Children: 2 sons 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 Eyes:  Denies blurring and eye irritation. CV:  Denies chest pain or discomfort and palpitations. Resp:  Denies cough and wheezing. GI:  Complains of indigestion and nausea; denies bloody stools and diarrhea. GU:  Denies dysuria and hematuria. Derm:  Denies rash. Neuro:  occ headaches . Psych:  Denies anxiety and depression. Endo:  Denies excessive thirst and excessive urination. Heme:  Denies abnormal bruising and bleeding.  Physical Exam  General:  mildly anxious and well appearing Eyes:  vision grossly intact, pupils equal, pupils round, and pupils reactive to light.  no conjunctival pallor, injection or icterus  Mouth:  pharynx pink and moist.   Neck:  No deformities, masses, or tenderness noted. Lungs:  Normal respiratory effort, chest expands symmetrically. Lungs are clear to auscultation, no crackles or wheezes. Heart:  Normal rate and regular rhythm. S1 and S2 normal without gallop, murmur, click, rub or other extra sounds. Abdomen:  mildly tender in all quadrants (worse in epigastric area) no  rebound or gaurding soft, normal bowel sounds, no hepatomegaly, and no splenomegaly.   Msk:  No deformity or scoliosis noted of thoracic or lumbar spine.   Skin:  Intact without suspicious lesions or rashes Cervical Nodes:  No lymphadenopathy noted Inguinal Nodes:  No significant adenopathy Psych:  seems mildly anxious   Impression & Recommendations:  Problem # 1:  GERD (ICD-530.81) Assessment Deteriorated pt states symptoms are severe - and not helped at all by any PPI or H2  blocker only mildly helped by antacids has had multiple EGDs and tx for Hpylori he is very distressed by this and also wonders if he could have celiac dz (though does not think that changes in diet make a diff) I strongly recommend he return to his GI to disc these issues ref done His updated medication list for this problem includes:    Omeprazole 20 Mg Cpdr (Omeprazole) .[name] Kitchen... Take 1 tablet by mouth two times a day  Orders: Gastroenterology Referral (GI)  Complete Medication List: 1)  Adult Aspirin Ec Low Strength 81 Mg Tbec (Aspirin) .... Take 1 tablet by mouth once a day 2)  Zocor 20 Mg Tabs (Simvastatin) .... Take 1 tablet by mouth once a day 3)  Multivitamins Tabs (Multiple vitamin) .... One daily 4)  Vitamin B Complex-c Caps (B complex-c) .... One daily 5)  Fish Oil Oil (Fish oil) .... Two daily 6)  Vitamin D 1000 Unit Tabs (Cholecalciferol) .... One daily 7)  Vitamin E 600 Unit Caps (Vitamin e) .... One daily 8)  Omeprazole 20 Mg Cpdr (Omeprazole) .... Take 1 tablet by mouth two times a day  Patient Instructions: 1)  we will refer you to GI at check out  2)  continue current medicines   Current Allergies (reviewed today): No known allergies

## 2010-08-12 NOTE — Progress Notes (Signed)
Summary: zocor  Phone Note Refill Request Call back at Home Phone [phone] Message from:  Patient on February 02, 2010 10:33 AM  Refills Requested: Medication #1:  ZOCOR 20 MG  TABS Take 1 tablet by mouth once a day Patient is asking fo a 10 to 15 day supply for this because it is going to be at least 10 days before he receives this from his mail order pharmacy. Uses [facility].   Initial call taken by: Melody Comas,  February 02, 2010 10:33 AM  Follow-up for Phone Call        I sent rx.  please notify patient.  Follow-up by: Crawford Givens MD,  February 02, 2010 12:59 PM  Additional Follow-up for Phone Call Additional follow up Details #1::        Patient Advised.  Additional Follow-up by: Delilah Shan CMA (AAMA),  February 02, 2010 1:00 PM    Prescriptions: ZOCOR 20 MG  TABS (SIMVASTATIN) Take 1 tablet by mouth once a day  #30 x 0   Entered and Authorized by:   Crawford Givens MD   Signed by:   Crawford Givens MD on 02/02/2010   Method used:   Electronically to        [facility] (retail)       [address] [place], [place]  [address]       Ph: [phone]       Fax: [phone]   RxID:   [id number]

## 2010-08-12 NOTE — Procedures (Signed)
Summary: Endo Prep/[facility]  Endo Prep/[facility]   Imported By: Lester Paris 03/23/2010 11:29:50  _____________________________________________________________________  External Attachment:    Type:   Image     Comment:   External Document

## 2010-08-12 NOTE — Assessment & Plan Note (Signed)
Summary: PAIN IN BACK/DLO   Vital Signs:  Patient profile:   65 year old male Height:      66.5 inches Weight:      166 pounds BMI:     26.49 Temp:     98.0 degrees F oral Pulse rate:   72 / minute Pulse rhythm:   regular BP sitting:   120 / 76  (left arm) Cuff size:   regular  Vitals Entered ByMelody Comas (April 21, 2010 4:00 PM) CC: back pain    History of Present Illness: R shoulder and neck pain.  Also with pain on eyes.  Started 3 weeks ago.  No fevers.  No NAV.  Present intermittently, worse at night. Pain with ROM in neck.  Some relief with tylenol.  On statin.  "I hurt all over."   B hand pain, elbows, hips, shoulders.    H/o migraines, was much better for years.  Prev with photophobia during a HA.  No photophobia now.  H/o occ flashing in R eye but eye exam was normal at ophtho clinic.   Does have h/o deviated septum.  H/o allergy symptoms, prev was on shots.    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  Otherwise negative.    Physical Exam  General:  GEN: nad, alert and oriented HEENT: mucous membranes moist, TM wnl bilaterally, EOMI, PERRL, fundus wnl bilaterally NECK: no LA, slight decrease in range of motion with rotation to the right due to discomfort, no midline pain, R paraspinal muscles are tender to palpation  CV: rrr.  no murmur PULM: ctab, no inc wob ABD: soft, +bs EXT: no edema SKIN: no acute rash  No active synovitis on the wrist or elbows.  No cuff impingement on the right.     Impression & Recommendations:  Problem # 1:  MYALGIA (ICD-729.1) This may be due to statin.  Hold statin for now.  Nontoxic and okay for outpatient follow up.  He may have migraine component, possibly exacerabated/triggered by muscle spasm on R trap.  I would use flexeril in meantime and have patient report back with update.  He agrees.  His updated medication list for this problem includes:    Adult Aspirin Ec Low Strength 81 Mg Tbec (Aspirin) .[name] Kitchen... Take 1 tablet by mouth once a day    Flexeril 10 Mg Tabs (Cyclobenzaprine hcl) .[name] Kitchen... 1/2 to 1 tab by mouth three times a day as needed for muscle pain, sedation caution  Complete Medication List: 1)  Adult Aspirin Ec Low Strength 81 Mg Tbec (Aspirin) .... Take 1 tablet by mouth once a day 2)  Zocor 20 Mg Tabs (Simvastatin) .... Take 1 tablet by mouth once a day- held as of 04/21/10 3)  Multivitamins Tabs (Multiple vitamin) .... One daily 4)  Vitamin B Complex-c Caps (B complex-c) .... One daily 5)  Fish Oil Oil (Fish oil) .... Two daily 6)  Vitamin D 1000 Unit Tabs (Cholecalciferol) .... One daily 7)  Vitamin E 600 Unit Caps (Vitamin e) .... One daily 8)  Omeprazole 40 Mg Cpdr (Omeprazole) .[name] Kitchen.. 1 by mouth  two times a day 9)  Flexeril 10 Mg Tabs (Cyclobenzaprine hcl) .... 1/2 to 1 tab by mouth three times a day as needed for muscle pain, sedation caution  Patient Instructions: 1)  Stop taking the simvastatin and use the flexeril three times a day as needed for pain.  It may make you drowsy.  You can take a 1/2 tab at a time.  Let me know how you are doing in 4 weeks, sooner if needed.  Take care.  Glad to see you today.  Prescriptions: FLEXERIL 10 MG TABS (CYCLOBENZAPRINE HCL) 1/2 to 1 tab by mouth three times a day as needed for muscle pain, sedation caution  #30 x 2   Entered and Authorized by:   Crawford Givens MD   Signed by:   Crawford Givens MD on 04/21/2010   Method used:   Electronically to        [facility] (retail)        [address] [place], [place]  [address]       Ph: [phone]       Fax: [phone]   RxID:   [id number]   Current Allergies (reviewed today): No known allergies

## 2010-08-12 NOTE — Miscellaneous (Signed)
Summary: labs  Clinical Lists Changes  Problems: Added new problem of IBS (ICD-564.1) Added new problem of GERD (ICD-530.81) Added new problem of CONSTIPATION (ICD-564.00) Orders: Added new Test order of TLB-CRP-High Sensitivity (C-Reactive Protein) (86140-FCRP) - Signed Added new Test order of TLB-CBC Platelet - w/Differential (85025-CBCD) - Signed Added new Test order of T-Sprue Panel (Celiac Disease Aby Eval) (83516x3/[address]) - Signed  Appended Document: labs

## 2010-08-12 NOTE — Progress Notes (Signed)
Summary: form from [place]  Phone Note Call from Patient   Summary of Call: Form from [place] is on your shelf.  Willaim Sheng is asking that you sign this, have it scanned and mailed. Initial call taken by: Lowella Petties CMA,  July 21, 2009 9:12 AM  Follow-up for Phone Call        form done and in nurse in box  Follow-up by: Judith Part MD,  July 21, 2009 10:59 AM  Additional Follow-up for Phone Call Additional follow up Details #1::        Original mailed to [facility], copy placed up front to be scanned. Additional Follow-up by: Lowella Petties CMA,  July 21, 2009 11:26 AM

## 2010-08-12 NOTE — Letter (Signed)
Summary: Patient Notice-Endo Biopsy Results  [facility]  [address] [place], [place] [address]   Phone: [phone]  Fax: [phone]        March 25, 2010 MRN: [id number]    Kyle Sanchez [address] [place], [place]  [address]    Dear [name],  I am pleased to inform you that the biopsies taken during your recent endoscopic examination did not show any evidence of cancer upon pathologic examination. The biopsies showed acid related changes. There was no evidence of celiac disease.  Continue with the treatment plan as outlined on the day of your      exam.  Please call us if you are having persistent problems or have questions about your condition that have not been fully answered at this time.  Sincerely,  Meryl Dare MD [facility]  This letter has been electronically signed by your physician.  Appended Document: Patient Notice-Endo Biopsy Results letter mailed to patient's home

## 2010-08-12 NOTE — Assessment & Plan Note (Signed)
Summary: GERD, abdominal pain /Kyle Sanchez    History of Present Illness Visit Type: Follow-up Visit Primary GI MD: Elie Goody MD [facility] Primary Provider: [name] Requesting Provider: [name] Chief Complaint: Abdominal pain with acid reflux History of Present Illness:   65 YO MALE KNOWN TO DR. Russella Dar. HE HAS DX OF CHRONIC GERD,IBS,LAST SEEN 11/09. COMES IN TODAY WITH POORLY CONTROLLED REFLUX SXS,DESPITE OMEPRAZOLE 20 MG TWICE DAILY. HE RELATES WORSE SXS OVER THE PAST YEAR,DRY THROAT ,BITTER TASTE,SOUR BRASH,AND A SPASM FEELING IN ESOPHAGUS IN THE MORNINGS ON AWAKENING. NO DYSPHAGIA. HE HAS HOB ELEVATED.APPETITE GOOD, WEIGHT STABLE. ALSO WITH RATHER DIFFUSE ONGOING ABDOMINAL SORENESS, DISCOMFORT. +CONSTIPATION, NO HEME. COULD NOT TOLERATE ROBINUL IN PAST DUE TO DRY MOUTH. HE HAS BEEN RESEARCHING AND IS CONCERNED ABOUT CELIAC DISEASE AND WOULD LIKE TO BE TESTED.   GI Review of Systems    Reports abdominal pain and  acid reflux.      Denies belching, bloating, chest pain, dysphagia with liquids, dysphagia with solids, heartburn, loss of appetite, nausea, vomiting, vomiting blood, weight loss, and  weight gain.        Denies anal fissure, black tarry stools, change in bowel habit, constipation, diarrhea, diverticulosis, fecal incontinence, heme positive stool, hemorrhoids, irritable bowel syndrome, jaundice, light color stool, liver problems, rectal bleeding, and  rectal pain.    Current Medications (verified): 1)  Adult Aspirin Ec Low Strength 81 Mg  Tbec (Aspirin) .... Take 1 Tablet By Mouth Once A Day 2)  Zocor 20 Mg  Tabs (Simvastatin) .... Take 1 Tablet By Mouth Once A Day 3)  Multivitamins   Tabs (Multiple Vitamin) .... One Daily 4)  Vitamin B Complex-C   Caps (B Complex-C) .... One Daily 5)  Fish Oil   Oil (Fish Oil) .... Two Daily 6)  Vitamin D 1000 Unit  Tabs (Cholecalciferol) .... One Daily 7)  Vitamin E 600 Unit  Caps (Vitamin E) .... One Daily 8)  Omeprazole 20 Mg  Cpdr (Omeprazole) .... 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 Osteoarthritis Bell's palsy Allergic rhinitis  Past Surgical History: Last updated: 11/06/2008 Cholecystectomy Carotid doppler, nl---2/00 EGD 8/96, 12/02--gastritis--2/09--GERD Nasal septal repair--2007 colonoscopy--08/02/02--polyps and internal hemorrhoids--repeat 08/24/07-internal hemorrhoids  Family History: Last updated: 02-17-2010 Father: died @56  EtOh cirrosis Mother: dead Kidney stones, arthritis, CVA  Siblings:  7 bros --1 died @52  "liver"                                               6 sisters--1 @65  CVA, Hypertension all sibs have depression No FH of Colon Cancer: Family History of Prostate Cancer: Brother Family History of Colon Polyps: Brother  Social History: Last updated: February 17, 2010 Marital Status: Married 1966, lives with wife Children: 2 sons 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 Alcohol Use - yes-occasionally From [place] since 1995  Review of Systems  The patient denies allergy/sinus, anemia, anxiety-new, arthritis/joint pain, back pain, blood in urine, breast changes/lumps, change in vision, confusion, cough, coughing up blood, depression-new, fainting, fatigue, fever, headaches-new, hearing problems, heart murmur, heart rhythm changes, itching, menstrual pain, muscle pains/cramps, night sweats, nosebleeds, pregnancy symptoms, shortness of breath, skin rash, sleeping problems, sore throat, swelling  of feet/legs, swollen lymph glands, thirst - excessive , urination - excessive , urination changes/pain, urine leakage, vision changes, and voice change.         SEE HPI  Vital Signs:  Patient profile:   65 year old male Height:       66.5 inches Weight:      165 pounds BMI:     26.33 BSA:     1.85 Pulse rate:   74 / minute Pulse rhythm:   regular BP sitting:   120 / 70  (left arm)  Vitals Entered By: Merri Ray CMA Duncan Dull) (March 13, 2010 10:02 AM)  Physical Exam  General:  Well developed, well nourished, no acute distress. Head:  Normocephalic and atraumatic. Eyes:  PERRLA, no icterus. Lungs:  Clear throughout to auscultation. Heart:  heart murmur systolicSOFT  Abdomen:  SOFT, MILD DIFFUSE TENDERNESS, NO FOCAL,BS+,NOMASS OR HSM Rectal:  NOT DONE Extremities:  No clubbing, cyanosis, edema or deformities noted. Neurologic:  Alert and  oriented x4;  grossly normal neurologically. Psych:  Alert and cooperative. Normal mood and affect.anxious.     Impression & Recommendations:  Problem # 1:  GASTROESOPHAGEAL REFLUX DISEASE (ICD-530.81) Assessment Deteriorated POORLY CONTROLLED  REVIEWED TIGHTER ANTIREFLUX REGIMEN WILL GIVE TRIAL OF OMEPRAZOLE 20 MG IN AM ,AND INCREASE TO 40 MG  IN THE PM, AS MOST OF HIS SXS SEEM NOCTURNAL  Problem # 2:  IRRITABLE BOWEL SYNDROME (ICD-564.1) Assessment: Deteriorated DIFFUSE ABDOMINAL SORENESS,CONSTIPATION.   CONTINUE  COLON CLEANSE Q 5-6 DAYS- MIRALAX  INCREASES GAS LABS AS BELOW-SCEEN FOR CELIAC DIEASE ADD TRIAL OF ALIGN ONE DAILY X 2 MONTHS FOLLOW UP WITH DR. Russella Dar OR MYSELF IN ONE MONTH  Problem # 3:  COLONIC POLYPS, HX OF (ICD-V12.72) Assessment: Comment Only ADENOMATOUS-LAST COLON 2/09 NO POLYPS-FOLLOW UP 2014  Patient Instructions: 1)  Please go to lab, basement level. 2)  Take Align 1 cap daily for 2 months. Samples given. 3)  Take the Omeprazole 1 cap each morning and take Take 2 prilosec samples at night. Samples given. 4)  We made you a follow up appointment with Dr Russella Dar for 04-27-10. Call us if you need to be seen sooner. 5)  Gerd and Reflux precautions handouts given. 6)  Copy sent to : Roxy Manns, MD 7)  The medication list was reviewed and  reconciled.  All changed / newly prescribed medications were explained.  A complete medication list was provided to the patient / caregiver.

## 2010-08-12 NOTE — Assessment & Plan Note (Signed)
Summary: FLU SHOT/CLE  Nurse Visit   Allergies: No Known Drug Allergies  Orders Added: 1)  Flu Vaccine 3yrs + MEDICARE PATIENTS [Q2039] 2)  Administration Flu vaccine - MCR [G0008]  Flu Vaccine Consent Questions     Do you have a history of severe allergic reactions to this vaccine? no    Any prior history of allergic reactions to egg and/or gelatin? no    Do you have a sensitivity to the preservative Thimersol? no    Do you have a past history of Guillan-Barre Syndrome? no    Do you currently have an acute febrile illness? no    Have you ever had a severe reaction to latex? no    Vaccine information given and explained to patient? yes    Are you currently pregnant? no    Lot Number:[id number]   Exp Date:01/09/2011   Site Given  Left Deltoid IM 

## 2010-08-12 NOTE — Assessment & Plan Note (Signed)
Summary: F/U GERD, abd pain, saw PA   History of Present Illness Visit Type: Follow-up Visit Primary GI MD: Elie Goody MD [facility] Primary Selyna Klahn: Crawford Givens, MD Requesting Janiece Scovill: Crawford Givens, MD Chief Complaint: follow-up  still c/o acid reflux primarily in the morning and some in the evening History of Present Illness:   This is a 65 year old male relates persistent problems with burning in his throat, primarily early in the morning and in the evenings. In the recumbent position. Recent upper endoscopy was normal and she is maintained on omeprazole 40 mg twice daily. He relates pressure around his eyes as well as drainage in his throat.   GI Review of Systems    Reports acid reflux.      Denies abdominal pain, belching, bloating, chest pain, dysphagia with liquids, dysphagia with solids, heartburn, loss of appetite, nausea, vomiting, vomiting blood, weight loss, and  weight gain.      Reports constipation.     Denies anal fissure, black tarry stools, change in bowel habit, diarrhea, diverticulosis, fecal incontinence, heme positive stool, hemorrhoids, irritable bowel syndrome, jaundice, light color stool, liver problems, rectal bleeding, and  rectal pain.   Current Medications (verified): 1)  Adult Aspirin Ec Low Strength 81 Mg  Tbec (Aspirin) .... Take 1 Tablet By Mouth Once A Day (On Hold) 2)  Zocor 20 Mg  Tabs (Simvastatin) .... Take 1 Tablet By Mouth Once A Day- Held As of 04/21/10 3)  Multivitamins   Tabs (Multiple Vitamin) .... One Daily (On Hold) 4)  Vitamin B Complex-C   Caps (B Complex-C) .... One Daily  (On Hold) 5)  Fish Oil 1000 Mg Caps (Omega-3 Fatty Acids) .[name] Kitchen.. 1 By Mouth Once Daily 6)  Vitamin D 1000 Unit  Tabs (Cholecalciferol) .... One Daily 7)  Vitamin E 600 Unit  Caps (Vitamin E) .... One Daily  (On Hold) 8)  Omeprazole 40 Mg Cpdr (Omeprazole) .[name] Kitchen.. 1 By Mouth Two Times A Day 9)  Flexeril 10 Mg Tabs (Cyclobenzaprine Hcl) .... 1/2 To 1 Tab By Mouth Three  Times A Day As Needed For Muscle Pain, Sedation Caution  Allergies (verified): No Known Drug Allergies  Past History:  Past Medical History: Reviewed history from 04/21/2010 and no changes required. 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 Cholecystectomy Carotid doppler, nl---2/00 EGD 8/96, 12/02--gastritis--2/09--GERD Nasal septal repair--2007 colonoscopy--08/02/02--polyps and internal hemorrhoids--repeat 08/24/07-internal hemorrhoids  Family History: Reviewed history from 01/30/2010 and no changes required. Father: died @56  EtOh cirrosis Mother: dead Kidney stones, arthritis, CVA  Siblings:  7 bros --1 died @52  "liver"                                               6 sisters--1 @65  CVA, Hypertension all sibs have depression No FH of Colon Cancer: Family History of Prostate Cancer: Brother Family History of Colon Polyps: Brother  Social History: Reviewed history from 04/21/2010 and no changes required. 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  The pertinent positives and negatives are noted as above and in the HPI. All other ROS were reviewed and were negative.   Vital Signs:  Patient profile:   64 year old male Height:      65.5 inches Weight:      166 pounds BMI:     27.30 Pulse rate:   84 / minute Pulse rhythm:   regular BP sitting:   122 / 72  (left arm)  Vitals Entered By: Milford Cage NCMA (April 27, 2010 10:32 AM)  Physical Exam  General:  Well developed,  well nourished, no acute distress. Head:  Normocephalic and atraumatic. Eyes:  PERRLA, no icterus. Mouth:  No deformity or lesions, dentition normal. Lungs:  Clear throughout to auscultation. Heart:  Regular rate and rhythm; no murmurs, rubs,  or bruits. Abdomen:  Soft, nontender and nondistended. No masses, hepatosplenomegaly or hernias noted. Normal bowel sounds. Psych:  anxious.    Impression & Recommendations:  Problem # 1:  GERD (ICD-530.81) He has underlying GERD, however, his throat symptoms are likely related to sinusitis, postnasal drip and/or allergies or other causes. I do not feel that all his symptoms are related to GERD. I offered a 24-hour ambulatory pH study performed while on acid suppressants and he declines because he does not think he can tolerate a nasal catheter. I advised him to follow up with his primary physician for further evaluation of his ENT symptoms.  Problem # 2:  IBS (ICD-564.1)  Problem # 3:  COLONIC POLYPS, HX OF (ICD-V12.72) Recall colonoscopy due February 2014.  Patient Instructions: 1)  Please continue current medications.  2)  Please schedule a follow-up appointment as needed.  3)  Copy sent to :Crawford Givens, MD 4)  The medication list was reviewed and reconciled.  All changed / newly prescribed medications were explained.  A complete medication list was provided to the patient / caregiver.

## 2010-08-26 ENCOUNTER — Encounter (INDEPENDENT_AMBULATORY_CARE_PROVIDER_SITE_OTHER): Payer: Self-pay | Admitting: *Deleted

## 2010-08-26 ENCOUNTER — Other Ambulatory Visit: Payer: Self-pay | Admitting: Family Medicine

## 2010-08-26 ENCOUNTER — Other Ambulatory Visit (INDEPENDENT_AMBULATORY_CARE_PROVIDER_SITE_OTHER): Payer: MEDICARE

## 2010-08-26 DIAGNOSIS — E785 Hyperlipidemia, unspecified: Secondary | ICD-10-CM

## 2010-08-26 LAB — LIPID PANEL
Cholesterol: 151 mg/dL (ref 0–200)
HDL: 45.1 mg/dL (ref >39.00)
VLDL: 16.8 mg/dL (ref 0.0–40.0)

## 2010-09-01 ENCOUNTER — Telehealth: Payer: Self-pay | Admitting: Family Medicine

## 2010-09-08 NOTE — Progress Notes (Signed)
Summary: pt is taking zocor  Phone Note Call from Patient   Caller: Patient Call For: Crawford Givens MD Summary of Call: Pt wants it to be known that he started back on zocor around november, so when his labs were drawn this month he was on the medicine.  He wants it back on his med list.               Lowella Petties CMA, AAMA  September 01, 2010 1:30 PM   Follow-up for Phone Call        I called the patient and the myalgias are worse back on the zocor.  He has been exercising more.  We talked about options.  I would cut the zocor in half to 10mg  a day and then call back with update in  ~2weeks.  If still with sig aches, then consider pravastatin at that point.  He agrees.  He'll call back with update. Crawford Givens MD  September 01, 2010 1:38 PM     New/Updated Medications: SIMVASTATIN 20 MG TABS (SIMVASTATIN) 1/2 tab by mouth at bedtime

## 2010-11-24 NOTE — Assessment & Plan Note (Signed)
[facility]                         GASTROENTEROLOGY OFFICE NOTE   NAME:RUIZHazen, Brumett                         MRN:          [id number]  DATE:07/28/2007                            DOB:          19-Feb-1946    REFERRING PHYSICIAN:  Willaim Sheng D. Bean, FNP   REASON FOR CONSULTATION:  Worsening acid reflux and a personal history  of adenomatous colon polyps.   HISTORY OF PRESENT ILLNESS:  Mr. Sirmon has chronic GERD initially  diagnosed in 1996.  He underwent upper endoscopy at that time and had a  repeat endoscopy in December 2002.  He underwent colonoscopy in January  2004 for alternating constipation and diarrhea, and a 4 mm adenomatous  colon polyp and internal hemorrhoids were noted.  He notes worsening  reflux symptoms, particularly in the middle of the night with burning  epigastric pain and a burning substernal discomfort.  Occasionally the  pain radiates to his left upper quadrant.  He has noticed frequent gas,  bloating, and constipation, and he has used milk of magnesia and Colace  intermittently.  He has significant morning reflux symptoms as well.  He  notes no dysphagia, odynophagia, nausea or vomiting, weight loss,  melena, hematochezia, or change in stool caliber.  There is no family  history of colon cancer, colon polyps, or inflammatory bowel disease.   PAST MEDICAL HISTORY:  GERD  Irritable bowel syndrome  Adenomatous colon polyps  Internal hemorrhoids  Osteoarthritis  Bell's palsy  Hypertension  Hyperlipidemia  Allergic rhinitis  Status post cholecystectomy.   CURRENT MEDICATIONS:  Listed on the chart, updated, and reviewed.   MEDICATION ALLERGIES:  None known.   SOCIAL HISTORY AND REVIEW OF SYSTEMS:  Per the hand-written form.   PHYSICAL EXAM:  Well-developed, well-nourished mildly overweight white  male in no acute distress.  Height 5 feet 7-1/2 inches, weight 166 pounds, BMI=26, blood pressure  118/68, pulse 72 and regular.  HEENT:  Anicteric sclerae.  Oropharynx clear.  CHEST:  Clear to auscultation bilaterally.  CARDIAC:  Regular rate and rhythm without murmurs appreciated.  ABDOMEN:  Soft and nontender, nondistended.  Normoactive bowel sounds.  No palpable organomegaly, masses, or hernias.  RECTAL:  Deferred to time of colonoscopy.  EXTREMITIES:  Without cyanosis, clubbing, or edema.  NEUROLOGIC:  Alert and oriented x3.  Grossly nonfocal.   ASSESSMENT AND PLAN:  1. Personal history of adenomatous colon polyps with ongoing      constipation.  He is due for his 5 year surveillance colonoscopy.      He is advised to substantially increase his daily fiber and fluid      intake.  He may continue to use milk of magnesia and/or Colace as      needed.  Risks, benefits, and alternatives to colonoscopy and      possible biopsy with possible polypectomy discussed with the      patient and he consents to proceed.  This will be scheduled      electively.  2. Worsening reflux symptoms.  Re-intensify all standard antireflux      measures.  Discontinue Prilosec OTC  and begin omeprazole 40 mg p.o.      q. a.m. taken 30 minutes before breakfast.  Risks, benefits, and      alternatives to upper endoscopy discussed with the patient and he      consents to proceed.  This will be scheduled electively.  He is      overweight by body mass index and his target weight is 150-155      pounds.  I have advised him to return to Everrett Coombe, FNP, for long      term weight loss program.  This may help his reflux symptoms as      well.     Venita Lick. Russella Dar, MD, [facility]  Electronically Signed    MTS/MedQ  DD: 08/03/2007  DT: 08/03/2007  Job #: [id number]   cc:   Billie D. Bean, FNP

## 2010-11-24 NOTE — Assessment & Plan Note (Signed)
[facility]                         GASTROENTEROLOGY OFFICE NOTE   NAME:Kyle Sanchez, Kyle Sanchez                         MRN:          [id number]  DATE:10/30/2007                            DOB:          October 19, 1945    Mr. Shidler complains of worsening problems with heartburn and acid reflux  on omeprazole 20 mg p.o. b.i.d.  He has no dysphagia or odynophagia.  He  notes generalized abdominal discomfort with a feeling of fullness and he  has also had mild constipation.  He notes no melena, hematochezia,  change in bowel habits, change in stool caliber, nausea or vomiting.  Upper endoscopy and colonoscopy performed in February 2009 revealed only  internal hemorrhoids. The results were review with him.   CURRENT MEDICATIONS:  Listed on the chart - updated and reviewed.   MEDICATION ALLERGIES:  NONE KNOWN.   PHYSICAL EXAMINATION:  In no acute distress.  Weight 166.4 pounds, blood pressure is 124/72, pulse 56 and regular.  CHEST:  Clear to auscultation bilaterally.  CARDIAC:  Regular rate and rhythm without murmurs.  ABDOMEN:  Soft, nondistended.  Mild left lower quadrant tenderness to  deep palpation.  No rebound or guarding.  No palpable organomegaly,  masses or hernias.   ASSESSMENT AND PLAN:  Constipation with generalized abdominal discomfort  and fullness with mild left lower quadrant tenderness on physical exam.  He is to increase MiraLax to t.i.d. to q.i.d.  Will add an antispasmodic  if this is not effective in controlling his symptoms.  1. Gastroesophageal reflux disease.  Ineffective control on omeprazole      b.i.d.  Change to Kapidex 60 mg p.o. q.a.m.  2. Return office visit in 2 months.     Venita Lick. Russella Dar, MD, [facility]  Electronically Signed    MTS/MedQ  DD: 11/07/2007  DT: 11/07/2007  Job #: [id number]   cc:   Billie D. Bean, FNP

## 2011-02-04 ENCOUNTER — Other Ambulatory Visit: Payer: Self-pay | Admitting: Family Medicine

## 2011-02-04 DIAGNOSIS — E785 Hyperlipidemia, unspecified: Secondary | ICD-10-CM

## 2011-02-04 DIAGNOSIS — Z8042 Family history of malignant neoplasm of prostate: Secondary | ICD-10-CM

## 2011-02-10 ENCOUNTER — Other Ambulatory Visit (INDEPENDENT_AMBULATORY_CARE_PROVIDER_SITE_OTHER): Payer: MEDICARE | Admitting: Family Medicine

## 2011-02-10 DIAGNOSIS — E785 Hyperlipidemia, unspecified: Secondary | ICD-10-CM

## 2011-02-10 DIAGNOSIS — Z8042 Family history of malignant neoplasm of prostate: Secondary | ICD-10-CM

## 2011-02-10 LAB — LIPID PANEL
LDL Cholesterol: 102 mg/dL — ABNORMAL HIGH (ref 0–99)
Total CHOL/HDL Ratio: 3

## 2011-02-11 LAB — COMPREHENSIVE METABOLIC PANEL
ALT: 17 U/L (ref 0–53)
AST: 20 U/L (ref 0–37)
Albumin: 4.1 g/dL (ref 3.5–5.2)
Calcium: 9.1 mg/dL (ref 8.4–10.5)
Chloride: 104 mEq/L (ref 96–112)
Creatinine, Ser: 0.8 mg/dL (ref 0.4–1.5)
Potassium: 4.9 mEq/L (ref 3.5–5.1)
Sodium: 142 mEq/L (ref 135–145)

## 2011-02-15 ENCOUNTER — Encounter: Payer: Self-pay | Admitting: Family Medicine

## 2011-02-16 ENCOUNTER — Encounter: Payer: Self-pay | Admitting: Family Medicine

## 2011-02-16 ENCOUNTER — Ambulatory Visit (INDEPENDENT_AMBULATORY_CARE_PROVIDER_SITE_OTHER): Payer: Medicare Other | Admitting: Family Medicine

## 2011-02-16 DIAGNOSIS — K219 Gastro-esophageal reflux disease without esophagitis: Secondary | ICD-10-CM

## 2011-02-16 DIAGNOSIS — E7800 Pure hypercholesterolemia, unspecified: Secondary | ICD-10-CM

## 2011-02-16 DIAGNOSIS — Z8042 Family history of malignant neoplasm of prostate: Secondary | ICD-10-CM

## 2011-02-16 DIAGNOSIS — E785 Hyperlipidemia, unspecified: Secondary | ICD-10-CM

## 2011-02-16 DIAGNOSIS — Z86010 Personal history of colonic polyps: Secondary | ICD-10-CM

## 2011-02-16 MED ORDER — FLUTICASONE PROPIONATE 50 MCG/ACT NA SUSP: 2.0000 | Freq: Every day | NASAL | Status: DC

## 2011-02-16 NOTE — Progress Notes (Signed)
L ear is sensitive after cutting grass.  Didn't know if there was a FB.  He wanted this checked.   Still with GERD sx.  Occ sx.  "I love tomatoes and they don't love me."  He cut out fried foods.  His whole family has similar sx.  He's been treated for H pylori prev.  He's asking about long term PPI treatment.  We talked about this today.  He is miserable w/o PPI treatment.  Prev had seen Dr. Russella Dar with GI, with colon and EGD done.    Elevated Cholesterol: Using medications without problems: yes, see below Muscle aches: yes at night, didn't change when stopping statin.  Minimal aches during the day.  He gets a feeling diffuse stiffness when sedentary.  He gets better with stretching.    Brother with prostate cancer. He is s/p surgery and doing well.  See exam and plan. No hematuria or change in urinary sx.   PMH and SH reviewed  ROS: See HPI, otherwise noncontributory.  Meds, vitals, and allergies reviewed.   GEN: nad, alert and oriented HEENT: mucous membranes moist, L canal with minimal irritation that appears to be resolving (I talked with pt about this and no f/u needed), nasal/oral exam wnl NECK: supple w/o LA CV: rrr PULM: ctab, no inc wob ABD: soft, +bs EXT: no edema SKIN: no acute rash, SK noted on back Prostate gland firm and smooth, no enlargement, nodularity, tenderness, mass, asymmetry or induration.

## 2011-02-16 NOTE — Patient Instructions (Addendum)
Check with your insurance to see if they will cover the shingles shot. I would get a flu shot each fall.   Keep exercising. See Shirlee Limerick about your referral before your leave today. Take coenzyme q10- 100mg  a day and see if that helps the aches.

## 2011-02-17 ENCOUNTER — Encounter: Payer: Self-pay | Admitting: Family Medicine

## 2011-02-18 DIAGNOSIS — Z86010 Personal history of colonic polyps: Secondary | ICD-10-CM | POA: Insufficient documentation

## 2011-02-18 DIAGNOSIS — Z8042 Family history of malignant neoplasm of prostate: Secondary | ICD-10-CM | POA: Insufficient documentation

## 2011-02-18 NOTE — Assessment & Plan Note (Signed)
Continue statin for now, refer to nutrition, and start co-Q 10 to see if that helps with the aches. He agrees.

## 2011-02-18 NOTE — Assessment & Plan Note (Signed)
Cont current meds and refer to nutrition.

## 2011-02-18 NOTE — Assessment & Plan Note (Signed)
psa wnl and exam wnl.  Continue as is for now.

## 2011-03-05 ENCOUNTER — Other Ambulatory Visit: Payer: Self-pay | Admitting: Family Medicine

## 2011-04-08 ENCOUNTER — Ambulatory Visit (INDEPENDENT_AMBULATORY_CARE_PROVIDER_SITE_OTHER): Payer: Medicare Other

## 2011-04-08 DIAGNOSIS — Z23 Encounter for immunization: Secondary | ICD-10-CM

## 2011-04-29 ENCOUNTER — Encounter: Payer: Self-pay | Admitting: Family Medicine

## 2011-04-29 ENCOUNTER — Ambulatory Visit (INDEPENDENT_AMBULATORY_CARE_PROVIDER_SITE_OTHER): Payer: Medicare Other | Admitting: Family Medicine

## 2011-04-29 DIAGNOSIS — L259 Unspecified contact dermatitis, unspecified cause: Secondary | ICD-10-CM

## 2011-04-29 DIAGNOSIS — K219 Gastro-esophageal reflux disease without esophagitis: Secondary | ICD-10-CM

## 2011-04-29 MED ORDER — SUCRALFATE 1 GM/10ML PO SUSP: 1.0000 g | Freq: Three times a day (TID) | ORAL | Status: AC

## 2011-04-29 MED ORDER — SIMVASTATIN 20 MG PO TABS: 10.0000 mg | ORAL_TABLET | Freq: Every day | ORAL | Status: DC

## 2011-04-29 NOTE — Progress Notes (Signed)
Hand changes- palmar flaky skin and occ erythema on arms.  He was prev on unrecalled medicine w/o much relief. Asking about options.   GERD:  Frequent heartburn, laryngeal sx with throat discomfort, cough- dry.  Frequent indigestion.  Episodic sx.  Dark yellow stool noted by patient, but stools aren't greasy or black.  Head of bed is elevated already; still with frequent symptoms at night.  When he changes meds, it will work for about 2-3 months and then it loses its effect.  Still on PPI.  He has a bland diet.  He would like to get set up with nutrition, to see what they have to offer.  Prev colonoscopy and EGD done.    Meds, vitals, and allergies reviewed.   ROS: See HPI.  Otherwise, noncontributory.  Nad ncat Mmm rrr ctab abd soft, not ttp, normal bs Ext w/o edema Palms with flaky patches of skin noted.  Smaller red lesions noted on the forearms.

## 2011-04-29 NOTE — Patient Instructions (Addendum)
Check and see what medicine you have at home for your hands.  Let me know.  Call about the nutrition program.  Take the sucralfate 3 times a day.  When you take the prilosec, make sure it is before taking the sucralfate.  Let me know if that helps.

## 2011-04-30 ENCOUNTER — Encounter: Payer: Self-pay | Admitting: Family Medicine

## 2011-04-30 ENCOUNTER — Telehealth: Payer: Self-pay | Admitting: *Deleted

## 2011-04-30 MED ORDER — TRIAMCINOLONE ACETONIDE 0.1 % EX CREA: TOPICAL_CREAM | Freq: Two times a day (BID) | CUTANEOUS | Status: DC

## 2011-04-30 NOTE — Telephone Encounter (Signed)
Pt called to report that the medicine he didn't know the name of is ammonium lactate 12% cream.  Uses [facility] [address] if this is going to be refilled.

## 2011-04-30 NOTE — Assessment & Plan Note (Signed)
Likely cause of hand lesions, he'll notify me about what he had at home. I'll see what else can be done additionally.

## 2011-04-30 NOTE — Telephone Encounter (Signed)
Left detailed message on patients cell phone

## 2011-04-30 NOTE — Assessment & Plan Note (Addendum)
>  25 min spent with face to face with patient, >50% counseling and/or coordinating care. D/w pt about options.  Add on carafate and he'll f/u with nutrition. He agrees with plan.  Prev with colonoscopy and EGD done, prev LFTs wnl.

## 2011-04-30 NOTE — Telephone Encounter (Signed)
Please have pt try triamcinolone 0.1% applied BID and see if that helps his hands.  If improved, then he can taper off the medicine.  If no improvement in 2-3 weeks, then stop the medicine.  Thanks.

## 2011-06-09 ENCOUNTER — Ambulatory Visit: Payer: Self-pay | Admitting: Family Medicine

## 2011-06-12 ENCOUNTER — Ambulatory Visit: Payer: Self-pay | Admitting: Family Medicine

## 2011-08-06 ENCOUNTER — Encounter: Payer: Self-pay | Admitting: Family Medicine

## 2011-08-06 ENCOUNTER — Ambulatory Visit (INDEPENDENT_AMBULATORY_CARE_PROVIDER_SITE_OTHER): Payer: Medicare Other | Admitting: Family Medicine

## 2011-08-06 VITALS — BP 130/72 | HR 89 | Temp 97.8°F | Wt 165.0 lb

## 2011-08-06 DIAGNOSIS — M159 Polyosteoarthritis, unspecified: Secondary | ICD-10-CM

## 2011-08-06 DIAGNOSIS — K219 Gastro-esophageal reflux disease without esophagitis: Secondary | ICD-10-CM

## 2011-08-06 DIAGNOSIS — M130 Polyarthritis, unspecified: Secondary | ICD-10-CM

## 2011-08-06 DIAGNOSIS — K589 Irritable bowel syndrome without diarrhea: Secondary | ICD-10-CM

## 2011-08-06 LAB — SEDIMENTATION RATE: Sed Rate: 10 mm/hr (ref 0–22)

## 2011-08-06 MED ORDER — OMEPRAZOLE 40 MG PO CPDR: 40.0000 mg | DELAYED_RELEASE_CAPSULE | Freq: Every day | ORAL | Status: DC

## 2011-08-06 NOTE — Progress Notes (Signed)
Muscle aches and pains: worse at night, didn't change when stopping statin. Less aches during the day. He gets a feeling diffuse stiffness when sedentary. He gets better with stretching.  It gets worse in winter, and before weather changes.  Diffuse aches, in back, shoulders, anterior chest, elbows, hips, knees, ankles.  No weakness.  "My brothers are the same way."    Prev eczema got better with TAC.    GERD: Frequent heartburn, laryngeal sx with throat discomfort, cough- dry. Frequent indigestion. Episodic sx. Head of bed is elevated already; still with some symptoms at night.  Still on PPI. He has a bland diet. Prev colonoscopy and EGD done.  He avoids trigger foods, fried foods.   PMH and SH reviewed  ROS: See HPI, otherwise noncontributory.  Meds, vitals, and allergies reviewed.   nad ncat Mmm Neck supple, no LA rrr ctab abd soft, normal BS Ext w/o edema.  Chronic changes to IP joints noted B but not active synovitis/erythema.  No ulnar deviation of fingers.  Normal rom at elbows and shoulders, no erythema or joint puffiness noted

## 2011-08-06 NOTE — Patient Instructions (Signed)
You can get your results through our phone system.  Follow the instructions on the blue card. Take care.   

## 2011-08-08 ENCOUNTER — Encounter: Payer: Self-pay | Admitting: Family Medicine

## 2011-08-08 NOTE — Assessment & Plan Note (Signed)
Given his duration of aches and the diffuse aches, check ANA/RF/ESR. If all neg, then this is likely diffuse OA.  D/w pt today.

## 2011-08-08 NOTE — Assessment & Plan Note (Signed)
Continue ppi and trigger avoidance.  >25 min spent with face to face with patient, >50% counseling and/or coordinating care.

## 2011-09-16 ENCOUNTER — Encounter: Payer: Self-pay | Admitting: Family Medicine

## 2011-09-16 ENCOUNTER — Ambulatory Visit (INDEPENDENT_AMBULATORY_CARE_PROVIDER_SITE_OTHER): Payer: Medicare Other | Admitting: Family Medicine

## 2011-09-16 DIAGNOSIS — E7800 Pure hypercholesterolemia, unspecified: Secondary | ICD-10-CM

## 2011-09-16 DIAGNOSIS — K219 Gastro-esophageal reflux disease without esophagitis: Secondary | ICD-10-CM

## 2011-09-16 DIAGNOSIS — R109 Unspecified abdominal pain: Secondary | ICD-10-CM

## 2011-09-16 LAB — COMPREHENSIVE METABOLIC PANEL
ALT: 19 U/L (ref 0–53)
AST: 20 U/L (ref 0–37)
Calcium: 9.6 mg/dL (ref 8.4–10.5)
Chloride: 102 mEq/L (ref 96–112)
Creatinine, Ser: 0.8 mg/dL (ref 0.4–1.5)
Sodium: 141 mEq/L (ref 135–145)
Total Bilirubin: 0.3 mg/dL (ref 0.3–1.2)
Total Protein: 7.3 g/dL (ref 6.0–8.3)

## 2011-09-16 LAB — LIPID PANEL
Cholesterol: 174 mg/dL (ref 0–200)
HDL: 52.7 mg/dL
LDL Cholesterol: 100 mg/dL — ABNORMAL HIGH (ref 0–99)
Total CHOL/HDL Ratio: 3
Triglycerides: 109 mg/dL (ref 0.0–149.0)
VLDL: 21.8 mg/dL (ref 0.0–40.0)

## 2011-09-16 LAB — CBC WITH DIFFERENTIAL/PLATELET
Basophils Absolute: 0.1 K/uL (ref 0.0–0.1)
Basophils Relative: 1.2 % (ref 0.0–3.0)
Eosinophils Absolute: 0.5 K/uL (ref 0.0–0.7)
Eosinophils Relative: 7.7 % — ABNORMAL HIGH (ref 0.0–5.0)
HCT: 44.9 % (ref 39.0–52.0)
Hemoglobin: 14.9 g/dL (ref 13.0–17.0)
Lymphocytes Relative: 31.6 % (ref 12.0–46.0)
Lymphs Abs: 2.1 K/uL (ref 0.7–4.0)
MCHC: 33.2 g/dL (ref 30.0–36.0)
MCV: 88.1 fl (ref 78.0–100.0)
Monocytes Absolute: 0.7 K/uL (ref 0.1–1.0)
Monocytes Relative: 10.6 % (ref 3.0–12.0)
Neutro Abs: 3.2 K/uL (ref 1.4–7.7)
Neutrophils Relative %: 48.9 % (ref 43.0–77.0)
Platelets: 226 K/uL (ref 150.0–400.0)
RBC: 5.1 Mil/uL (ref 4.22–5.81)
RDW: 14.3 % (ref 11.5–14.6)
WBC: 6.6 K/uL (ref 4.5–10.5)

## 2011-09-16 NOTE — Progress Notes (Signed)
Longstanding h/o GERD with mult EGDs prev done.  L sided chest pain, into the back, into the arm. Pain oscillates within the episodes.  He's had mult episodes and "it feels like a heart attack."  On PPI.  His brother is the same way, "just like everybody in my family."  No h/o CAD.    He has frequent heartburn, laryngeal sx with throat discomfort, cough- dry. Frequent indigestion.  Head of bed is elevated already; still with symptoms at night.  He has a bland diet. Prev colonoscopy and EGD done. He avoids trigger foods, fried foods.   He'd like second opinion on his sx.  I support this, though I think he has had good care per Dr. Russella Dar.    No weight loss, blood in stool, vomiting, fevers.  He is still constipated and occ feels bloated.   He has a history of post nasal gtt.  Prev on allergy shots and now on nasal steroids. He does have some relief with nasal steroids.   S/p cholecystectomy. Doesn't smoke, no etoh.   Meds, vitals, and allergies reviewed.   ROS: See HPI.  Otherwise, noncontributory.  GEN: nad, alert and oriented HEENT: mucous membranes moist NECK: supple w/o LA CV: rrr. PULM: ctab, no inc wob ABD: soft, +bs EXT: no edema SKIN: no acute rash

## 2011-09-16 NOTE — Patient Instructions (Signed)
See [place] about your referral before you leave today. You can get your results through our phone system.  Follow the instructions on the blue card. Take care.   

## 2011-09-20 NOTE — Assessment & Plan Note (Signed)
With unremarkable labs. Refer for second opinion per GI.  Okay for outpatient f/u.

## 2011-11-07 ENCOUNTER — Encounter: Payer: Self-pay | Admitting: Family Medicine

## 2011-12-02 ENCOUNTER — Ambulatory Visit (INDEPENDENT_AMBULATORY_CARE_PROVIDER_SITE_OTHER): Payer: Medicare Other | Admitting: Family Medicine

## 2011-12-02 ENCOUNTER — Encounter: Payer: Self-pay | Admitting: Family Medicine

## 2011-12-02 VITALS — BP 142/80 | HR 59 | Temp 98.0°F | Wt 168.8 lb

## 2011-12-02 DIAGNOSIS — K219 Gastro-esophageal reflux disease without esophagitis: Secondary | ICD-10-CM

## 2011-12-02 NOTE — Patient Instructions (Signed)
I think the GI clinic has a good plan.  Please call them about follow up.   Take care.

## 2011-12-03 NOTE — Assessment & Plan Note (Signed)
>  15 min spent with face to face with patient, >100% counseling and/or coordinating care I support the plan by GI and this was discussed with patient.  If he does have reflux then he needs treatment.  If not GERD, then he'll need further eval.  The probe is reasonable.  I support GI's plan.  Pt understood.

## 2011-12-03 NOTE — Progress Notes (Signed)
Longstanding h/o GERD sx, prev with eval by Dr. Bluford Kaufmann.  Proposed Ph probe pending.  Pt wanted to discuss.  Consult note reviewed with patient.   Meds, vitals, and allergies reviewed.   ROS: See HPI.  Otherwise, noncontributory.  Nad, exam deferred as >15 min spent with face to face with patient, >100% counseling and/or coordinating care

## 2011-12-15 ENCOUNTER — Ambulatory Visit: Payer: Self-pay | Admitting: Gastroenterology

## 2011-12-20 ENCOUNTER — Encounter: Payer: Self-pay | Admitting: Family Medicine

## 2011-12-23 ENCOUNTER — Encounter: Payer: Self-pay | Admitting: Family Medicine

## 2012-02-09 ENCOUNTER — Other Ambulatory Visit: Payer: Self-pay | Admitting: Family Medicine

## 2012-02-09 DIAGNOSIS — Z125 Encounter for screening for malignant neoplasm of prostate: Secondary | ICD-10-CM

## 2012-02-14 ENCOUNTER — Other Ambulatory Visit (INDEPENDENT_AMBULATORY_CARE_PROVIDER_SITE_OTHER): Payer: Medicare Other

## 2012-02-14 DIAGNOSIS — Z125 Encounter for screening for malignant neoplasm of prostate: Secondary | ICD-10-CM

## 2012-02-14 LAB — PSA, MEDICARE: PSA: 1.19 ng/ml (ref 0.10–4.00)

## 2012-02-21 ENCOUNTER — Ambulatory Visit (INDEPENDENT_AMBULATORY_CARE_PROVIDER_SITE_OTHER): Payer: Medicare Other | Admitting: Family Medicine

## 2012-02-21 ENCOUNTER — Encounter: Payer: Self-pay | Admitting: Family Medicine

## 2012-02-21 VITALS — BP 144/84 | HR 63 | Temp 98.2°F | Ht 67.0 in | Wt 167.0 lb

## 2012-02-21 DIAGNOSIS — Z0000 Encounter for general adult medical examination without abnormal findings: Secondary | ICD-10-CM

## 2012-02-21 DIAGNOSIS — K219 Gastro-esophageal reflux disease without esophagitis: Secondary | ICD-10-CM

## 2012-02-21 DIAGNOSIS — E785 Hyperlipidemia, unspecified: Secondary | ICD-10-CM

## 2012-02-21 DIAGNOSIS — Z79899 Other long term (current) drug therapy: Secondary | ICD-10-CM

## 2012-02-21 NOTE — Progress Notes (Signed)
Patient is currently taking an oral ABX from the dermatologist that he is taking for 15 days, Doxycycline.  Ninetta Lights, CMA  I have personally reviewed the Medicare Annual Wellness questionnaire and have noted 1. The patient's medical and social history 2. Their use of alcohol, tobacco or illicit drugs 3. Their current medications and supplements 4. The patient's functional ability including ADL's, fall risks, home safety risks and hearing or visual             impairment. 5. Diet and physical activities 6. Evidence for depression or mood disorders  The patients weight, height, BMI have been recorded in the chart and visual acuity is per eye clinic.  I have made referrals, counseling and provided education to the patient based review of the above and I have provided the pt with a written personalized care plan for preventive services.  Wife recently with mastectomy for breast cancer.    See scanned forms.  Routine anticipatory guidance given to patient.  See health maintenance. Tetanus 2006 Flu yearly Shingles d/w pt PNA 2011 Colonoscopy 2009 DXA d/w pt, has been on PPI long term Prostate cancer screening- PSA wnl.  FH prostate CA Advance directive d/w pt.  He'll get help on this outside of the clinic.    GERD is improved on current meds.  Would need DXA as he's been on PPI for long duration. We discussed.  Requesting GI records.  S/o pH probe study.    PMH and SH reviewed  Meds, vitals, and allergies reviewed.   ROS: See HPI.  Otherwise negative.    GEN: nad, alert and oriented HEENT: mucous membranes moist NECK: supple w/o LA CV: rrr. PULM: ctab, no inc wob ABD: soft, +bs EXT: no edema SKIN: no acute rash DRE deferred.  Chronic IP joint changes noted

## 2012-02-21 NOTE — Patient Instructions (Addendum)
Check with your insurance to see if they will cover the shingles shot. I would get a flu shot each fall.   See Shirlee Limerick about your referral before you leave today. Take care.

## 2012-02-22 ENCOUNTER — Encounter: Payer: Self-pay | Admitting: Family Medicine

## 2012-02-22 DIAGNOSIS — Z0000 Encounter for general adult medical examination without abnormal findings: Secondary | ICD-10-CM | POA: Insufficient documentation

## 2012-02-22 NOTE — Assessment & Plan Note (Signed)
Routine anticipatory guidance given to patient.  See health maintenance. Tetanus 2006 Flu yearly Shingles d/w pt PNA 2011 Colonoscopy 2009 DXA d/w pt, has been on PPI long term Prostate cancer screening- PSA wnl.  FH prostate CA Advance directive d/w pt.  He'll get help on this outside of the clinic.

## 2012-02-22 NOTE — Assessment & Plan Note (Signed)
Prev controlled, continue current meds.

## 2012-02-22 NOTE — Assessment & Plan Note (Signed)
Requesting GI records.  Will refer for DEXA given duration of PPI tx.

## 2012-02-24 ENCOUNTER — Ambulatory Visit (INDEPENDENT_AMBULATORY_CARE_PROVIDER_SITE_OTHER)
Admission: RE | Admit: 2012-02-24 | Discharge: 2012-02-24 | Disposition: A | Discharge status: Home / Self Care | Payer: Medicare Other | Source: Ambulatory Visit

## 2012-02-24 ENCOUNTER — Telehealth: Payer: Self-pay | Admitting: Family Medicine

## 2012-02-24 DIAGNOSIS — Z79899 Other long term (current) drug therapy: Secondary | ICD-10-CM

## 2012-02-24 NOTE — Telephone Encounter (Signed)
L/M with wife to have him return the call. 

## 2012-02-24 NOTE — Telephone Encounter (Signed)
Patient advised.

## 2012-02-24 NOTE — Telephone Encounter (Signed)
Notify pt.  I got the GI records.  It did show the reflux study and his meds are reasonable. It mentioned amitiza and linzess for constipation.  I don't think we need to do anything now other than the bone density and this is pending.  Thanks.

## 2012-04-10 ENCOUNTER — Encounter: Payer: Self-pay | Admitting: Family Medicine

## 2012-04-10 ENCOUNTER — Ambulatory Visit (INDEPENDENT_AMBULATORY_CARE_PROVIDER_SITE_OTHER): Payer: Medicare Other | Admitting: Family Medicine

## 2012-04-10 VITALS — BP 118/64 | HR 70 | Temp 98.6°F | Wt 168.8 lb

## 2012-04-10 DIAGNOSIS — L989 Disorder of the skin and subcutaneous tissue, unspecified: Secondary | ICD-10-CM

## 2012-04-10 DIAGNOSIS — K219 Gastro-esophageal reflux disease without esophagitis: Secondary | ICD-10-CM

## 2012-04-10 MED ORDER — DOXYCYCLINE HYCLATE 100 MG PO TABS: 100.0000 mg | ORAL_TABLET | Freq: Two times a day (BID) | ORAL | Status: DC

## 2012-04-10 MED ORDER — SIMVASTATIN 20 MG PO TABS: 10.0000 mg | ORAL_TABLET | Freq: Every evening | ORAL | Status: DC

## 2012-04-10 NOTE — Patient Instructions (Addendum)
Take the doxycycline for the rash and let me know if that (and the reflux) improve.  Call me with an update in about 2 weeks, sooner if needed.  Take care.

## 2012-04-11 ENCOUNTER — Other Ambulatory Visit: Payer: Self-pay | Admitting: *Deleted

## 2012-04-11 DIAGNOSIS — R21 Rash and other nonspecific skin eruption: Secondary | ICD-10-CM | POA: Insufficient documentation

## 2012-04-11 MED ORDER — SIMVASTATIN 20 MG PO TABS: 10.0000 mg | ORAL_TABLET | Freq: Every evening | ORAL | Status: DC

## 2012-04-11 NOTE — Assessment & Plan Note (Signed)
Will start doxy given the skin condition, not for GERD. However, I do want to hear if patient's GERD improves on the abx.  D/w pt.  I don't know if this is in some way affecting gastric emptying/motility. He agrees with plan, he'll update me.

## 2012-04-11 NOTE — Progress Notes (Signed)
GERD.  Continues to have burning the abd, up to the mouth.  No vomiting, blood in stool.  No diarrhea.  No new foods.  Compliant with meds. PH probe documented the reflux.  Was prev on abx for skin infection and sx improved during time of treatment, was on doxy per derm at that point.    H/o itchy red lesions on arms/legs/feet and hands bilaterally.  With he had prev lesions, was treated with doxy per derm with improvement.  Now with return of similar sx.   Meds, vitals, and allergies reviewed.   ROS: See HPI.  Otherwise, noncontributory.  nad ncat rrr ctab abd soft, not ttp, normal BS Skin with blanching red irregular lesions on arms/legs/feet and hands bilaterally w/o fluctuance.

## 2012-04-11 NOTE — Assessment & Plan Note (Signed)
Restart doxy on the outside chance his skin condition may respond.  It isn't a spreading cellulitis but he could have early impetigo.  He'll update me with his response to meds.

## 2012-05-01 ENCOUNTER — Ambulatory Visit (INDEPENDENT_AMBULATORY_CARE_PROVIDER_SITE_OTHER): Payer: Medicare Other

## 2012-05-01 ENCOUNTER — Telehealth: Payer: Self-pay

## 2012-05-01 DIAGNOSIS — Z23 Encounter for immunization: Secondary | ICD-10-CM

## 2012-05-01 NOTE — Telephone Encounter (Signed)
Pt's rash is the same; no better no worse, pt has finished antibiotic. Pt reflux is also no better.[facility] [address] .Please advise.

## 2012-05-02 MED ORDER — TRIAMCINOLONE ACETONIDE 0.1 % EX CREA: TOPICAL_CREAM | Freq: Two times a day (BID) | CUTANEOUS | Status: DC

## 2012-05-02 NOTE — Telephone Encounter (Signed)
Then I would use TAC on the rash, rx sent. .  I would get him to f/u with GI about the reflux.

## 2012-05-02 NOTE — Telephone Encounter (Signed)
Patient advised.

## 2012-06-19 ENCOUNTER — Ambulatory Visit (INDEPENDENT_AMBULATORY_CARE_PROVIDER_SITE_OTHER): Payer: Medicare Other | Admitting: Family Medicine

## 2012-06-19 ENCOUNTER — Encounter: Payer: Self-pay | Admitting: Family Medicine

## 2012-06-19 VITALS — BP 142/80 | HR 68 | Temp 98.2°F | Wt 170.8 lb

## 2012-06-19 DIAGNOSIS — K219 Gastro-esophageal reflux disease without esophagitis: Secondary | ICD-10-CM

## 2012-06-19 DIAGNOSIS — R300 Dysuria: Secondary | ICD-10-CM

## 2012-06-19 DIAGNOSIS — L989 Disorder of the skin and subcutaneous tissue, unspecified: Secondary | ICD-10-CM

## 2012-06-19 MED ORDER — HYDROCORTISONE ACETATE 25 MG RE SUPP: 25.0000 mg | Freq: Two times a day (BID) | RECTAL | Status: DC | PRN

## 2012-06-19 NOTE — Progress Notes (Signed)
H/o GERD with prev GI eval and lower abd pain.  He doesn't have f/u with GI until 07/25/12.  Also with some lower abd pain.  Some burning with urination since last week.  No fevers.  He always drinks a lot of water.  He stopped all of him meds last week.    Hemorrhoids.  Pain with hard stools.  No blood in stool.  Symptomatic only with hard stools.    Hand rash continues to wax and wane. TAC didn't help.    Meds, vitals, and allergies reviewed.   ROS: See HPI.  Otherwise, noncontributory.  nad  ncat  Rrr, murmur noted.  ctab  abd soft, minimally ttp in lower abd and epigastrium w/o rebound, normal BS  Skin with blanching red irregular lesions on arms/legs/feet and hands bilaterally w/o fluctuance.

## 2012-06-19 NOTE — Patient Instructions (Addendum)
We'll contact you with your lab report. Use the suppositories as needed and try cutting out gluten.  If that doesn't help, then notify the GI clinic next month.   Take care.

## 2012-06-20 DIAGNOSIS — R300 Dysuria: Secondary | ICD-10-CM | POA: Insufficient documentation

## 2012-06-20 NOTE — Assessment & Plan Note (Signed)
Will check ucx before treating.  No sx suggestive of prostatitis and no CVA pain.

## 2012-06-20 NOTE — Assessment & Plan Note (Signed)
Will follow off gluten for now though this doesn't appear to be dermatitis herpetiformis.

## 2012-06-20 NOTE — Assessment & Plan Note (Addendum)
He hasn't had good control.  He didn't know if a gluten free diet would be beneficial.  I am at a loss for ideas at this point.  It is reasonable to try gluten free diet.  If not improved, it may shed light on what isn't contributing.  He'll still f/u with GI.

## 2012-06-21 LAB — URINE CULTURE: Colony Count: NO GROWTH

## 2012-07-03 ENCOUNTER — Other Ambulatory Visit: Payer: Self-pay | Admitting: *Deleted

## 2012-07-03 MED ORDER — OMEPRAZOLE 40 MG PO CPDR: 40.0000 mg | DELAYED_RELEASE_CAPSULE | Freq: Two times a day (BID) | ORAL | Status: DC

## 2012-07-31 ENCOUNTER — Encounter: Payer: Self-pay | Admitting: Gastroenterology

## 2012-09-12 ENCOUNTER — Other Ambulatory Visit (INDEPENDENT_AMBULATORY_CARE_PROVIDER_SITE_OTHER): Payer: Medicare Other

## 2012-09-12 DIAGNOSIS — E7800 Pure hypercholesterolemia, unspecified: Secondary | ICD-10-CM

## 2012-09-12 LAB — HEPATIC FUNCTION PANEL
ALT: 22 U/L (ref 0–53)
AST: 21 U/L (ref 0–37)
Albumin: 3.9 g/dL (ref 3.5–5.2)
Alkaline Phosphatase: 64 U/L (ref 39–117)
Total Protein: 7.4 g/dL (ref 6.0–8.3)

## 2012-09-12 LAB — LIPID PANEL
Cholesterol: 161 mg/dL (ref 0–200)
Triglycerides: 107 mg/dL (ref 0.0–149.0)

## 2013-02-14 ENCOUNTER — Other Ambulatory Visit: Payer: Self-pay | Admitting: Family Medicine

## 2013-02-14 DIAGNOSIS — E7800 Pure hypercholesterolemia, unspecified: Secondary | ICD-10-CM

## 2013-02-14 DIAGNOSIS — Z125 Encounter for screening for malignant neoplasm of prostate: Secondary | ICD-10-CM

## 2013-02-19 ENCOUNTER — Other Ambulatory Visit (INDEPENDENT_AMBULATORY_CARE_PROVIDER_SITE_OTHER): Payer: Medicare Other

## 2013-02-19 DIAGNOSIS — E7800 Pure hypercholesterolemia, unspecified: Secondary | ICD-10-CM

## 2013-02-19 DIAGNOSIS — Z125 Encounter for screening for malignant neoplasm of prostate: Secondary | ICD-10-CM

## 2013-02-19 LAB — PSA, MEDICARE: PSA: 1.27 ng/ml (ref 0.10–4.00)

## 2013-02-19 LAB — LIPID PANEL
HDL: 48.6 mg/dL (ref >39.00)
VLDL: 20.8 mg/dL (ref 0.0–40.0)

## 2013-02-19 LAB — COMPREHENSIVE METABOLIC PANEL
ALT: 19 U/L (ref 0–53)
AST: 20 U/L (ref 0–37)
Creatinine, Ser: 0.9 mg/dL (ref 0.4–1.5)
Total Bilirubin: 0.8 mg/dL (ref 0.3–1.2)

## 2013-02-26 ENCOUNTER — Encounter: Payer: Self-pay | Admitting: Family Medicine

## 2013-02-26 ENCOUNTER — Ambulatory Visit (INDEPENDENT_AMBULATORY_CARE_PROVIDER_SITE_OTHER): Payer: Medicare Other | Admitting: Family Medicine

## 2013-02-26 VITALS — BP 152/66 | HR 66 | Temp 97.8°F | Ht 67.0 in | Wt 170.0 lb

## 2013-02-26 DIAGNOSIS — E785 Hyperlipidemia, unspecified: Secondary | ICD-10-CM

## 2013-02-26 DIAGNOSIS — F528 Other sexual dysfunction not due to a substance or known physiological condition: Secondary | ICD-10-CM

## 2013-02-26 DIAGNOSIS — Z0000 Encounter for general adult medical examination without abnormal findings: Secondary | ICD-10-CM

## 2013-02-26 MED ORDER — TADALAFIL 20 MG PO TABS: 10.0000 mg | ORAL_TABLET | ORAL | Status: DC | PRN

## 2013-02-26 MED ORDER — SIMVASTATIN 20 MG PO TABS: 10.0000 mg | ORAL_TABLET | Freq: Every evening | ORAL | Status: DC

## 2013-02-26 NOTE — Assessment & Plan Note (Signed)
Trial of cialis with routine cautions.

## 2013-02-26 NOTE — Patient Instructions (Addendum)
Try taking the half dose of simvastatin and see if you can tolerate it.  Try the half tab of cialis for now.  Take care.  Glad to see you. I would get a flu shot each fall.

## 2013-02-26 NOTE — Assessment & Plan Note (Signed)
Restart statin, stop if intolerant.

## 2013-02-26 NOTE — Progress Notes (Signed)
I have personally reviewed the Medicare Annual Wellness questionnaire and have noted 1. The patient's medical and social history 2. Their use of alcohol, tobacco or illicit drugs 3. Their current medications and supplements 4. The patient's functional ability including ADL's, fall risks, home safety risks and hearing or visual             impairment. 5. Diet and physical activities 6. Evidence for depression or mood disorders  The patients weight, height, BMI have been recorded in the chart and visual acuity is per eye clinic.  I have made referrals, counseling and provided education to the patient based review of the above and I have provided the pt with a written personalized care plan for preventive services.  See scanned forms.  Routine anticipatory guidance given to patient.  See health maintenance. Flu yearly Shingles encouraged PNA 2011 Tetanus 2006 Colonoscopy per GI, up to date Prostate cancer screening- PSA wnl Advance directive d/w pt. Living will prev done.  Cognitive function addressed- see scanned forms- and if abnormal then additional documentation follows.   ED noted, partial erections now.  Asking about options.   Elevated Cholesterol: Had stopped his statin due to memory concerns. Normal memory testing today.  Agreed to restart Muscle aches: no Diet compliance:yes Exercise:yes  BP has been normal on home checks.   PMH and SH reviewed  Meds, vitals, and allergies reviewed.   ROS: See HPI.  Otherwise negative.    GEN: nad, alert and oriented HEENT: mucous membranes moist NECK: supple w/o LA CV: rrr. PULM: ctab, no inc wob ABD: soft, +bs EXT: no edema SKIN: no acute rash

## 2013-02-26 NOTE — Assessment & Plan Note (Signed)
See scanned forms.  Routine anticipatory guidance given to patient.  See health maintenance. Flu yearly Shingles encouraged PNA 2011 Tetanus 2006 Colonoscopy per GI, up to date Prostate cancer screening- PSA wnl Advance directive d/w pt. Living will prev done.  Cognitive function addressed- see scanned forms- and if abnormal then additional documentation follows.

## 2013-03-27 ENCOUNTER — Ambulatory Visit (INDEPENDENT_AMBULATORY_CARE_PROVIDER_SITE_OTHER): Payer: Medicare Other | Admitting: Family Medicine

## 2013-03-27 ENCOUNTER — Encounter: Payer: Self-pay | Admitting: Gastroenterology

## 2013-03-27 ENCOUNTER — Encounter: Payer: Self-pay | Admitting: Family Medicine

## 2013-03-27 VITALS — BP 116/80 | HR 61 | Temp 98.1°F | Wt 169.5 lb

## 2013-03-27 DIAGNOSIS — K219 Gastro-esophageal reflux disease without esophagitis: Secondary | ICD-10-CM

## 2013-03-27 NOTE — Progress Notes (Signed)
He had been looking after his 68 yo grandson recently.  He was happy about spending time with him.  His daughter in law has had trouble with infertility.   Longstanding h/o GERD. He has seen GI mult time. Still on PPI.  Prev with pH probe study done. Prev treated for H pylori.  Noted FH GERD.   He had seen [facility].  He is asking about re eval through GI.   He has failed trial off PPI and I don't think he'll tolerate the trial off PPI to do the stool tests.  We discussed.   No vomiting, no blood in stool.  Still with burning in the abdomen and pharyngeal irritation, taste changes.   Meds, vitals, and allergies reviewed.   ROS: See HPI.  Otherwise, noncontributory.  nad ncat Mmm rrr ctab abd soft but epigastrum and lower abd ttp w/o rebound.  Ext well perfused.

## 2013-03-27 NOTE — Patient Instructions (Signed)
[facility] will call about your referral. Don't change your meds for now.  Take care.  

## 2013-03-28 ENCOUNTER — Other Ambulatory Visit: Payer: Self-pay | Admitting: *Deleted

## 2013-03-28 MED ORDER — OMEPRAZOLE 40 MG PO CPDR: 40.0000 mg | DELAYED_RELEASE_CAPSULE | Freq: Two times a day (BID) | ORAL | Status: DC

## 2013-03-28 NOTE — Telephone Encounter (Signed)
Received faxed refill request from pharmacy. Refill sent to pharmacy electronically. 

## 2013-03-29 NOTE — Assessment & Plan Note (Signed)
I need GI help and will refer back to GI.  I don't think we could do the stool studies for H pylori now.  Pt agrees.  App GI help.

## 2013-04-20 ENCOUNTER — Telehealth: Payer: Self-pay | Admitting: Family Medicine

## 2013-04-20 NOTE — Telephone Encounter (Signed)
Patient saw [facility] in 01/14 Dr Bluford Kaufmann , then came in and saw you. WE somehow referred to [facility] instead of [facility] and [facility] saw that it has been within a year that he saw another GI so they called the patient and cancelled his appt with Dr Russella Dar and told him he had to see the [facility] GI Dr. Please put a new referral in for him and call him on his cell# [phone].

## 2013-04-22 NOTE — Telephone Encounter (Signed)
My understanding was that he wanted this as a second opinion.  I may be incorrect with that, but that was my impression and that led to the [facility] referral.  Please let me know about this.

## 2013-04-23 NOTE — Telephone Encounter (Signed)
Noted, thanks!

## 2013-04-23 NOTE — Telephone Encounter (Signed)
Kyle Sanchez says that they wont see him b/c he saw them first then he transferred to [facility] so they said he needs to go back to them. I got him an appt with Dr Bluford Kaufmann the [facility]  GI  Dr that he wants to see. His thing was that he wanted to see a Dr not a N.P.

## 2013-05-07 ENCOUNTER — Ambulatory Visit: Payer: Medicare Other | Admitting: Gastroenterology

## 2013-05-17 ENCOUNTER — Other Ambulatory Visit: Payer: Self-pay

## 2013-05-23 ENCOUNTER — Ambulatory Visit (INDEPENDENT_AMBULATORY_CARE_PROVIDER_SITE_OTHER): Payer: Medicare Other

## 2013-05-23 DIAGNOSIS — Z23 Encounter for immunization: Secondary | ICD-10-CM

## 2013-06-12 ENCOUNTER — Ambulatory Visit: Payer: Self-pay | Admitting: Gastroenterology

## 2013-06-14 ENCOUNTER — Ambulatory Visit: Payer: Self-pay | Admitting: Gastroenterology

## 2013-06-15 LAB — PATHOLOGY REPORT

## 2013-06-28 ENCOUNTER — Encounter: Payer: Self-pay | Admitting: Family Medicine

## 2013-07-09 ENCOUNTER — Encounter: Payer: Self-pay | Admitting: Family Medicine

## 2013-07-11 ENCOUNTER — Encounter: Payer: Self-pay | Admitting: Family Medicine

## 2013-09-14 ENCOUNTER — Telehealth: Payer: Self-pay

## 2013-09-14 DIAGNOSIS — E7800 Pure hypercholesterolemia, unspecified: Secondary | ICD-10-CM

## 2013-09-14 NOTE — Telephone Encounter (Signed)
Pt said he gets lab test q 6 months; what lab test do you want pt to have and when. Pt request cb.

## 2013-09-15 NOTE — Telephone Encounter (Signed)
Only needs a lipid. Order is in.  Fasting lab visit needed.

## 2013-09-17 NOTE — Telephone Encounter (Signed)
Patient advised.  Lab appt scheduled.  

## 2013-09-19 ENCOUNTER — Other Ambulatory Visit (INDEPENDENT_AMBULATORY_CARE_PROVIDER_SITE_OTHER): Payer: Medicare HMO

## 2013-09-19 DIAGNOSIS — E7800 Pure hypercholesterolemia, unspecified: Secondary | ICD-10-CM

## 2013-09-19 LAB — LIPID PANEL
CHOL/HDL RATIO: 4
Cholesterol: 186 mg/dL (ref 0–200)
HDL: 46.8 mg/dL (ref >39.00)
LDL Cholesterol: 123 mg/dL — ABNORMAL HIGH (ref 0–99)
TRIGLYCERIDES: 82 mg/dL (ref 0.0–149.0)
VLDL: 16.4 mg/dL (ref 0.0–40.0)

## 2013-12-10 ENCOUNTER — Ambulatory Visit (INDEPENDENT_AMBULATORY_CARE_PROVIDER_SITE_OTHER): Payer: Medicare HMO | Admitting: Family Medicine

## 2013-12-10 ENCOUNTER — Encounter: Payer: Self-pay | Admitting: Family Medicine

## 2013-12-10 VITALS — BP 116/78 | HR 64 | Temp 98.3°F | Wt 167.0 lb

## 2013-12-10 DIAGNOSIS — K219 Gastro-esophageal reflux disease without esophagitis: Secondary | ICD-10-CM

## 2013-12-10 DIAGNOSIS — M549 Dorsalgia, unspecified: Secondary | ICD-10-CM | POA: Insufficient documentation

## 2013-12-10 MED ORDER — SIMVASTATIN 20 MG PO TABS: 10.0000 mg | ORAL_TABLET | Freq: Every evening | ORAL | Status: DC

## 2013-12-10 NOTE — Progress Notes (Signed)
Pre visit review using our clinic review tool, if applicable. No additional management support is needed unless otherwise documented below in the visit note.  He has had f/u with GI in the meantime.  He is still having GERD sx.  He was on reglan and it helped for about 2 months but then the sx returned.  He couldn't tolerate sucralfate; it dried his mouth and he had aches on the medicine.    He had more back pain in the meantime.  Upper back pain, radiated up the back of the head.  He didn't know if it was related to the sucralfate.  His chest is sore when he coughs, his chest wall feels tight.     His mood is lower than prev.  He thinks the heartburn is chronically irritating him and that is affecting his mood. He'll see GI again soon.   Meds, vitals, and allergies reviewed.   ROS: See HPI.  Otherwise, noncontributory.  nad ncat Mmm, OP wnl Neck supple, no LA rrr ctab Abd soft, not ttp Back w/o midline pain but just lateral to midline with upper T/lower C spine paraspinal tenderness w/o rash, with spasm tracking up to the occiput.

## 2013-12-10 NOTE — Patient Instructions (Signed)
Use a heating pad on your back and take up to 2 extra strength tylenol up to 3 times a day.  That should help.  Take care.

## 2013-12-10 NOTE — Assessment & Plan Note (Signed)
Per GI

## 2013-12-10 NOTE — Assessment & Plan Note (Signed)
Appears to be a benign muscle spasm, can use heat and tylenol for now.  Should resolve.  No need to image today.

## 2013-12-21 ENCOUNTER — Ambulatory Visit: Payer: Self-pay | Admitting: Gastroenterology

## 2014-01-09 ENCOUNTER — Ambulatory Visit (INDEPENDENT_AMBULATORY_CARE_PROVIDER_SITE_OTHER): Payer: Medicare HMO | Admitting: Family Medicine

## 2014-01-09 ENCOUNTER — Encounter: Payer: Self-pay | Admitting: Family Medicine

## 2014-01-09 VITALS — BP 116/76 | HR 64 | Temp 98.1°F | Wt 166.2 lb

## 2014-01-09 DIAGNOSIS — H6980 Other specified disorders of Eustachian tube, unspecified ear: Secondary | ICD-10-CM

## 2014-01-09 DIAGNOSIS — M2669 Other specified disorders of temporomandibular joint: Secondary | ICD-10-CM

## 2014-01-09 DIAGNOSIS — H6983 Other specified disorders of Eustachian tube, bilateral: Secondary | ICD-10-CM

## 2014-01-09 DIAGNOSIS — M26629 Arthralgia of temporomandibular joint, unspecified side: Secondary | ICD-10-CM | POA: Insufficient documentation

## 2014-01-09 DIAGNOSIS — R9389 Abnormal findings on diagnostic imaging of other specified body structures: Secondary | ICD-10-CM | POA: Insufficient documentation

## 2014-01-09 NOTE — Patient Instructions (Signed)
Start back on the flonase and we'll get the records about your CT in the meantime.   Take care. Glad to see you.

## 2014-01-09 NOTE — Assessment & Plan Note (Signed)
Not at the point of wanting a dental appliance.  Will monitor for now. He agrees.

## 2014-01-09 NOTE — Assessment & Plan Note (Signed)
Requesting records from Dr. Candace Cruise, will review and notify pt when received.

## 2014-01-09 NOTE — Assessment & Plan Note (Signed)
Restart flonase, use masking noises for sleep at night for the whooshing sound in his ears.  May improve with resolution of ETD.  D/w pt.  He agrees. He can update me as needed.

## 2014-01-09 NOTE — Progress Notes (Signed)
Pre visit review using our clinic review tool, if applicable. No additional management support is needed unless otherwise documented below in the visit note.  B constant low pitched hiss in his ears.   Going on for months.  No R ear pain.  No L ear pain.  He didn't try masking the noise.  No fevers, no chills, no vomiting.  He can't pop his ears with valsalva.   Off flonase recently.   Ttp at the R TMJ, esp in the AM.  Intermittent pain, worse in the AM.  No pain chewing.  No pain in his mouth.  He asked his dentist, unclear if it was from TMJ.    He had f/u with Dr. Candace Cruise in the meantime about CT for possible liver lesion.  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  I told him I would request his records.  He couldn't tolerate MRI to further w/u the lesion, when attempted recently.    Meds, vitals, and allergies reviewed.   ROS: See HPI.  Otherwise, noncontributory.  nad ncat Mmm TM wnl, no erythema but with no TM movement on valsalva OP wnl Nasal exam w/o purulent discharge Neck supple, no LA ttp near the R TMJ, mastoids not ttp

## 2014-01-21 ENCOUNTER — Other Ambulatory Visit: Payer: Self-pay

## 2014-01-21 MED ORDER — FLUTICASONE PROPIONATE 50 MCG/ACT NA SUSP: NASAL | Status: DC

## 2014-01-21 NOTE — Telephone Encounter (Signed)
Pt left v/m requesting refill flonase to [facility] [address]. Done.

## 2014-01-28 ENCOUNTER — Telehealth: Payer: Self-pay | Admitting: Family Medicine

## 2014-01-28 NOTE — Telephone Encounter (Signed)
Record request obtained.  I needed the most recent CT report to compare to prev.  GI sent records, but not the CT report. Please see if you can get that.  Thanks.

## 2014-01-28 NOTE — Telephone Encounter (Signed)
Re-faxed request noting CT scan Report.

## 2014-02-12 NOTE — Telephone Encounter (Signed)
Re-faxed request to Dr. Myrna Blazer office at [facility] [phone]

## 2014-02-25 ENCOUNTER — Telehealth: Payer: Self-pay | Admitting: Family Medicine

## 2014-02-25 NOTE — Telephone Encounter (Signed)
Notify pt.  I got a look at all of the old reports.  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  Both of those possible causes are benign and wouldn't need intervention.  I don't have a copy of any imaging that was done more recently, ie in the last year.   My questions at this point are: has he had any other imaging done and if so, where?    Let me know.  Thanks.

## 2014-02-25 NOTE — Telephone Encounter (Signed)
Spoke with patient.  He has been sent for some imaging from Dr. Candace Cruise at [facility] and I have been trying feverishly to get this information for over a month now.  I faxed for it 3 times and will call tomorrow to try to track it down.

## 2014-02-26 NOTE — Telephone Encounter (Signed)
Spoke with med rec at [facility] who referred me to [facility] (their records copying vendor).  These were faxed on 02/07/14 (10 pages) but they will re-fax today within a few hours.  I have placed a note on the fax machine asking that they be delivered to me ASAP.

## 2014-02-26 NOTE — Telephone Encounter (Signed)
Thanks for your effort on this.

## 2014-03-07 NOTE — Telephone Encounter (Signed)
Please send a letter to the patient.   We have tried to get all imaging records.  My understanding is that you had some repeat imaging done this year at an outside clinic.  We have the old records, but we are unable to get any other imaging from this year.  Did you have any imaging done in 2015?  If so, then please have a copy of it sent to our clinic. Thanks.

## 2014-03-07 NOTE — Telephone Encounter (Signed)
We received the records but Dr. Damita Dunnings says these are still not any scans from recent years, only 2009.  I don't know what else to do to get these records.  Could there possibly be no more recent scans and the patient is confused?

## 2014-03-08 ENCOUNTER — Encounter: Payer: Self-pay | Admitting: *Deleted

## 2014-03-08 NOTE — Telephone Encounter (Signed)
Letter mailed as requested

## 2014-03-19 ENCOUNTER — Other Ambulatory Visit: Payer: Self-pay | Admitting: Family Medicine

## 2014-03-19 ENCOUNTER — Other Ambulatory Visit (INDEPENDENT_AMBULATORY_CARE_PROVIDER_SITE_OTHER): Payer: Medicare HMO

## 2014-03-19 DIAGNOSIS — Z125 Encounter for screening for malignant neoplasm of prostate: Secondary | ICD-10-CM

## 2014-03-19 DIAGNOSIS — E785 Hyperlipidemia, unspecified: Secondary | ICD-10-CM

## 2014-03-19 LAB — COMPREHENSIVE METABOLIC PANEL
ALT: 19 U/L (ref 0–53)
AST: 22 U/L (ref 0–37)
Albumin: 4.2 g/dL (ref 3.5–5.2)
Alkaline Phosphatase: 58 U/L (ref 39–117)
BILIRUBIN TOTAL: 0.8 mg/dL (ref 0.2–1.2)
BUN: 13 mg/dL (ref 6–23)
CO2: 31 meq/L (ref 19–32)
CREATININE: 0.9 mg/dL (ref 0.4–1.5)
Calcium: 9.8 mg/dL (ref 8.4–10.5)
Chloride: 103 mEq/L (ref 96–112)
GFR: 91.14 mL/min (ref >60.00)
GLUCOSE: 92 mg/dL (ref 70–99)
Potassium: 4.4 mEq/L (ref 3.5–5.1)
Sodium: 141 mEq/L (ref 135–145)
Total Protein: 7.2 g/dL (ref 6.0–8.3)

## 2014-03-19 LAB — LIPID PANEL
CHOLESTEROL: 179 mg/dL (ref 0–200)
HDL: 44.3 mg/dL (ref >39.00)
LDL CALC: 110 mg/dL — AB (ref 0–99)
NonHDL: 134.7
TRIGLYCERIDES: 126 mg/dL (ref 0.0–149.0)
Total CHOL/HDL Ratio: 4
VLDL: 25.2 mg/dL (ref 0.0–40.0)

## 2014-03-19 LAB — PSA, MEDICARE: PSA: 1.57 ng/ml (ref 0.10–4.00)

## 2014-03-26 ENCOUNTER — Ambulatory Visit (INDEPENDENT_AMBULATORY_CARE_PROVIDER_SITE_OTHER): Payer: Medicare HMO | Admitting: Family Medicine

## 2014-03-26 ENCOUNTER — Encounter: Payer: Self-pay | Admitting: Family Medicine

## 2014-03-26 VITALS — BP 132/78 | HR 73 | Temp 98.1°F | Ht 67.0 in | Wt 168.5 lb

## 2014-03-26 DIAGNOSIS — H9313 Tinnitus, bilateral: Secondary | ICD-10-CM

## 2014-03-26 DIAGNOSIS — R9389 Abnormal findings on diagnostic imaging of other specified body structures: Secondary | ICD-10-CM

## 2014-03-26 DIAGNOSIS — Z23 Encounter for immunization: Secondary | ICD-10-CM

## 2014-03-26 DIAGNOSIS — E785 Hyperlipidemia, unspecified: Secondary | ICD-10-CM

## 2014-03-26 DIAGNOSIS — L989 Disorder of the skin and subcutaneous tissue, unspecified: Secondary | ICD-10-CM

## 2014-03-26 DIAGNOSIS — H6980 Other specified disorders of Eustachian tube, unspecified ear: Secondary | ICD-10-CM

## 2014-03-26 DIAGNOSIS — K219 Gastro-esophageal reflux disease without esophagitis: Secondary | ICD-10-CM

## 2014-03-26 DIAGNOSIS — Z0000 Encounter for general adult medical examination without abnormal findings: Secondary | ICD-10-CM

## 2014-03-26 DIAGNOSIS — Z7189 Other specified counseling: Secondary | ICD-10-CM

## 2014-03-26 DIAGNOSIS — H6983 Other specified disorders of Eustachian tube, bilateral: Secondary | ICD-10-CM

## 2014-03-26 MED ORDER — OMEPRAZOLE 40 MG PO CPDR: 40.0000 mg | DELAYED_RELEASE_CAPSULE | Freq: Two times a day (BID) | ORAL | Status: DC

## 2014-03-26 NOTE — Patient Instructions (Addendum)
Check with your insurance to see if they will cover the shingles shot. Vaughan Basta or Rosaria Ferries will call about your referral [facility]). Take care. Glad to see you.  Keep taking the probiotics. I'll check with Dr. Candace Cruise.   Call Dr. Allyson Sabal about the rash.  [phone].

## 2014-03-26 NOTE — Progress Notes (Signed)
Pre visit review using our clinic review tool, if applicable. No additional management support is needed unless otherwise documented below in the visit note.  I have personally reviewed the Medicare Annual Wellness questionnaire and have noted 1. The patient's medical and social history 2. Their use of alcohol, tobacco or illicit drugs 3. Their current medications and supplements 4. The patient's functional ability including ADL's, fall risks, home safety risks and hearing or visual             impairment. 5. Diet and physical activities 6. Evidence for depression or mood disorders  The patients weight, height, BMI have been recorded in the chart and visual acuity is per eye clinic.  I have made referrals, counseling and provided education to the patient based review of the above and I have provided the pt with a written personalized care plan for preventive services.  Provider list updated- see scanned forms.  Routine anticipatory guidance given to patient.  See health maintenance.  Flu 2015 Shingles d/w pt.  PNA 2011 Tetanus 2006 Colonoscopy up to date.  Prostate cancer screening wnl 2015 Advance directive wife designated if patient were incapacitated.  Cognitive function addressed- see scanned forms- and if abnormal then additional documentation follows.   Elevated Cholesterol: Using medications without problems:yes Muscle aches: occ, not from statin Diet compliance:yes Exercise:yes  Liver lesion, prev imaging reviewed.  He had f/u CT done with Dr. Candace Cruise on 12/21/13.  We've tried to get records for comparison.  We'll continue to work on this.  D/w pt.    Whooshing sound in ears, no help with flonase.  Intermittent.  No pain.   GERD sx better with probiotic added on.   PMH and SH reviewed  Meds, vitals, and allergies reviewed.   ROS: See HPI.  Otherwise negative.    GEN: nad, alert and oriented HEENT: mucous membranes moist NECK: supple w/o LA CV: rrr. PULM: ctab, no inc  wob ABD: soft, +bs EXT: no edema SKIN: blanching macular rash on forearms- he'll f/u with derm.

## 2014-03-28 DIAGNOSIS — Z7189 Other specified counseling: Secondary | ICD-10-CM | POA: Insufficient documentation

## 2014-03-28 NOTE — Assessment & Plan Note (Addendum)
Still requesting most recent records from Dr. Candace Cruise.  >25 minutes spent in face to face time with patient, >50% spent in counselling or coordination of care in total.  Prev abd imaging reviewed and d/w pt.

## 2014-03-28 NOTE — Assessment & Plan Note (Signed)
Not improved with flonase, refer to ENT.  He agrees.

## 2014-03-28 NOTE — Assessment & Plan Note (Signed)
Reasonable control, labs d/w pt.  Continue as is.  No change in meds. Diet and exercise d/w pt.

## 2014-03-28 NOTE — Assessment & Plan Note (Signed)
Flu 2015  Shingles d/w pt.  PNA 2011  Tetanus 2006  Colonoscopy up to date.  Prostate cancer screening wnl 2015  Advance directive wife designated if patient were incapacitated.  Cognitive function addressed- see scanned forms- and if abnormal then additional documentation follows.

## 2014-03-28 NOTE — Assessment & Plan Note (Signed)
He'll f/;u with derm.  

## 2014-03-28 NOTE — Assessment & Plan Note (Signed)
Improved with current PPI and probiotics.

## 2014-04-01 ENCOUNTER — Telehealth: Payer: Self-pay | Admitting: Family Medicine

## 2014-04-01 NOTE — Telephone Encounter (Signed)
Patient notified as instructed by telephone. Reports sent to Dr. Verdie Shire as instructed.

## 2014-04-01 NOTE — Telephone Encounter (Signed)
Patient had claustrophobia and asked about not doing MR re: liver lesion.  Called and left message for Dr. Candace Cruise.  Prev with 1.4cm lesion on liver in 2009 on CT, now 1.9 on last CT at [facility]. Await call back from Dr. Candace Cruise.

## 2014-04-01 NOTE — Telephone Encounter (Signed)
Call from Dr. Candace Cruise.  D/w him.  He agrees this is likely not pathologic and doesn't need fu imaging, given the minimal change from 2009.  Please notify pt.  He doesn't need the f/u MRI.  Please send a copy of both CTs and the MRI from 2009 to Dr. Candace Cruise with [facility].  Thanks.

## 2014-09-02 ENCOUNTER — Encounter: Payer: Self-pay | Admitting: Internal Medicine

## 2014-09-02 ENCOUNTER — Ambulatory Visit (INDEPENDENT_AMBULATORY_CARE_PROVIDER_SITE_OTHER): Payer: Medicare HMO | Admitting: Internal Medicine

## 2014-09-02 VITALS — BP 150/86 | HR 58 | Temp 98.2°F | Ht 67.0 in | Wt 170.5 lb

## 2014-09-02 DIAGNOSIS — H9313 Tinnitus, bilateral: Secondary | ICD-10-CM

## 2014-09-02 DIAGNOSIS — R030 Elevated blood-pressure reading, without diagnosis of hypertension: Secondary | ICD-10-CM

## 2014-09-02 DIAGNOSIS — J3089 Other allergic rhinitis: Secondary | ICD-10-CM

## 2014-09-02 MED ORDER — HYDROCHLOROTHIAZIDE 12.5 MG PO CAPS: 12.5000 mg | ORAL_CAPSULE | Freq: Every day | ORAL | Status: DC

## 2014-09-02 NOTE — Progress Notes (Signed)
   Subjective:    Patient ID: Kyle Sanchez, male    DOB: May 06, 1946, 69 y.o.   MRN: [id number]  HPI  He is here for tinnitus bilaterally; this is worse on the left than the right. This has been present for months and he has actually seen an Youth worker in [place] for this.  Recently he had his wife instill hydrogen peroxide into both ears. He could not hear the bubbles on the left but could on the right. When his wife instilled more hydroperoxide into the left ear; he developed nausea and dizziness.  He has allergies with significant postnasal drainage.  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  He has no upper respiratory tract infection symptoms.  Review of Systems  He denies hypertension. Blood pressures typically are in the 485 systolic.  He also has no history of gout.     Objective:   Physical Exam  Positive or pertinent findings include: The tympanic membranes are dull. Tuning fork exam revealed no lateralization. He has unsustained vertical and lateral nystagmus with extraocular motion. Romberg and finger-nose testing were negative. He has decreased range of motion of the cervical spine.  General appearance:Adequately nourished; no acute distress or increased work of breathing is present.  No  lymphadenopathy about the head, neck, or axilla noted.  Eyes: No conjunctival inflammation or lid edema is present. There is no scleral icterus. Ears:  External ear exam shows no significant lesions or deformities.  Otoscopic examination reveals clear canals, tympanic membranes are intact bilaterally without bulging, retraction, inflammation or discharge. Nose:  External nasal examination shows no deformity or inflammation. Nasal mucosa are pink and moist without lesions or exudates. No septal dislocation or deviation.No obstruction to airflow.  Oral exam: Dental hygiene is good; lips and gums are healthy appearing.There is no oropharyngeal erythema or exudate  noted.  Neck:  No deformities, thyromegaly, masses, or tenderness noted.    Heart:  Normal rate and regular rhythm. S1 and S2 normal without gallop, murmur, click, rub or other extra sounds.  Lungs:Chest clear to auscultation; no wheezes, rhonchi,rales ,or rubs present. Extremities:  No cyanosis, edema, or clubbing  noted  Skin: Warm & dry w/o tenting.       Assessment & Plan:  #1 tinnitus, bilaterally  #2 elevated blood pressure without diagnosis of hypertension  #3 allergic rhinitis  Plan: See orders and recommendations

## 2014-09-02 NOTE — Progress Notes (Signed)
Pre visit review using our clinic review tool, if applicable. No additional management support is needed unless otherwise documented below in the visit note. 

## 2014-09-02 NOTE — Patient Instructions (Addendum)
Go to WebMD for information about tinnitus. Avoid excess aspirin and excess noise exposure. "White noise" machine @ bedside can prevent tinnitus from affecting sleep. If symptoms persist or progress; ENT reassessment can be pursued.   Plain Mucinex (NOT D) for thick secretions ;force NON dairy fluids .   Nasal cleansing in the shower as discussed with lather of mild shampoo.After 10 seconds wash off lather while  exhaling through nostrils. Make sure that all residual soap is removed to prevent irritation.  Flonase OR Nasacort AQ 1 spray in each nostril twice a day as needed. Use the "crossover" technique into opposite nostril spraying toward opposite ear @ 45 degree angle, not straight up into nostril.  Plain Allegra (NOT D )  160 daily , Loratidine 10 mg , OR Zyrtec 10 mg @ bedtime  as needed for itchy eyes & sneezing.  Minimal Blood Pressure Goal= AVERAGE < 140/90;  Ideal is an AVERAGE < 135/85. This AVERAGE should be calculated from @ least 5-7 BP readings taken @ different times of day on different days of week. You should not respond to isolated BP readings , but rather the AVERAGE for that week .Please bring your  blood pressure cuff to office visits to verify that it is reliable.It  can also be checked against the blood pressure device at the pharmacy. Finger or wrist cuffs are not dependable; an arm cuff is.  Fill the  prescription for the BP medication if BP NOT @ goal based on  7 to 14 day average.

## 2014-11-11 ENCOUNTER — Encounter: Payer: Self-pay | Admitting: Gastroenterology

## 2014-11-12 ENCOUNTER — Encounter: Payer: Self-pay | Admitting: Gastroenterology

## 2015-01-29 ENCOUNTER — Encounter: Payer: Self-pay | Admitting: Family Medicine

## 2015-01-29 ENCOUNTER — Ambulatory Visit (INDEPENDENT_AMBULATORY_CARE_PROVIDER_SITE_OTHER): Payer: Medicare HMO | Admitting: Family Medicine

## 2015-01-29 VITALS — BP 120/76 | HR 64 | Temp 98.6°F | Wt 162.5 lb

## 2015-01-29 DIAGNOSIS — K219 Gastro-esophageal reflux disease without esophagitis: Secondary | ICD-10-CM

## 2015-01-29 MED ORDER — ACETAMINOPHEN ER 650 MG PO TBCR: 650.0000 mg | EXTENDED_RELEASE_TABLET | Freq: Three times a day (TID) | ORAL | Status: DC | PRN

## 2015-01-29 NOTE — Progress Notes (Signed)
Pre visit review using our clinic review tool, if applicable. No additional management support is needed unless otherwise documented below in the visit note.  Reviewed is GI consult note from Dr. Candace Cruise with patient, pasted text below: (1 year f/u. Chronic hx of GERD. On prilosec 40mg  bid. Tried reglan, carafate, dexilant without relief.EGD showed GERD. Previous Bravo pH confirmed GERD. Colon was neg. GET was normal though slow emptying initially. Recently, more GERD sxs. Persistent upper abdominal discomfort. Hurts to bend over. Has bed elevated. Careful about what he eats. Wt less than last year. No labs in over a year. CT 2015 was neg. Discussed esophageal manometry in the past. Does not want it done due t having tube down in nose. Discussed seeing Dr. Tamala Julian for possible Nissen. Afraid to have it done.)  He still has bad taste in the throat, worse at night.  Still with L sided abd pain, worse with leaning over and picking up an object but he doesn't feel like the pain is in the abd wall.  Sig FH of GERD noted.  He would like to avoid nissen or nasal tube placed at this point. Patient was asking about possible small intestine bacterial overgrowth.   PMH and SH reviewed  ROS: See HPI, otherwise noncontributory.  Meds, vitals, and allergies reviewed.   GEN: nad, alert and oriented HEENT: mucous membranes moist NECK: supple w/o LA CV: rrr.  PULM: ctab, no inc wob ABD: soft, +bs, slightly ttp across the abd and in the LLQ but w/o rebound.  This is chronic per patient.   EXT: no edema

## 2015-01-29 NOTE — Patient Instructions (Signed)
Don't change your meds for now.  Let me talk to Dr. Candace Cruise and we'll go from there.  Take care. Glad to see you.

## 2015-01-29 NOTE — Assessment & Plan Note (Signed)
He asked about bacterial overgrowth possibility.  I would like to talk to Dr. Candace Cruise first.  Pt agrees.  We'll be in touch. >25 minutes spent in face to face time with patient, >50% spent in counselling or coordination of care.

## 2015-01-31 ENCOUNTER — Encounter: Payer: Self-pay | Admitting: Family Medicine

## 2015-02-24 ENCOUNTER — Other Ambulatory Visit: Payer: Self-pay | Admitting: Family Medicine

## 2015-03-16 ENCOUNTER — Other Ambulatory Visit: Payer: Self-pay | Admitting: Family Medicine

## 2015-03-16 DIAGNOSIS — Z125 Encounter for screening for malignant neoplasm of prostate: Secondary | ICD-10-CM

## 2015-03-16 DIAGNOSIS — E785 Hyperlipidemia, unspecified: Secondary | ICD-10-CM

## 2015-03-21 ENCOUNTER — Other Ambulatory Visit (INDEPENDENT_AMBULATORY_CARE_PROVIDER_SITE_OTHER): Payer: Medicare HMO

## 2015-03-21 DIAGNOSIS — Z125 Encounter for screening for malignant neoplasm of prostate: Secondary | ICD-10-CM

## 2015-03-21 DIAGNOSIS — E785 Hyperlipidemia, unspecified: Secondary | ICD-10-CM

## 2015-03-21 LAB — COMPREHENSIVE METABOLIC PANEL
ALBUMIN: 4.4 g/dL (ref 3.5–5.2)
ALT: 17 U/L (ref 0–53)
AST: 19 U/L (ref 0–37)
Alkaline Phosphatase: 71 U/L (ref 39–117)
BUN: 15 mg/dL (ref 6–23)
CHLORIDE: 102 meq/L (ref 96–112)
CO2: 34 meq/L — AB (ref 19–32)
Calcium: 9.9 mg/dL (ref 8.4–10.5)
Creatinine, Ser: 0.9 mg/dL (ref 0.40–1.50)
GFR: 88.55 mL/min (ref >60.00)
Glucose, Bld: 91 mg/dL (ref 70–99)
POTASSIUM: 4.5 meq/L (ref 3.5–5.1)
Sodium: 141 mEq/L (ref 135–145)
Total Bilirubin: 0.8 mg/dL (ref 0.2–1.2)
Total Protein: 7 g/dL (ref 6.0–8.3)

## 2015-03-21 LAB — LIPID PANEL
CHOL/HDL RATIO: 4
CHOLESTEROL: 181 mg/dL (ref 0–200)
HDL: 49.4 mg/dL (ref >39.00)
LDL Cholesterol: 112 mg/dL — ABNORMAL HIGH (ref 0–99)
NonHDL: 131.23
Triglycerides: 98 mg/dL (ref 0.0–149.0)
VLDL: 19.6 mg/dL (ref 0.0–40.0)

## 2015-03-21 LAB — PSA, MEDICARE: PSA: 1.78 ng/ml (ref 0.10–4.00)

## 2015-03-28 ENCOUNTER — Ambulatory Visit (INDEPENDENT_AMBULATORY_CARE_PROVIDER_SITE_OTHER): Payer: Medicare HMO | Admitting: Family Medicine

## 2015-03-28 ENCOUNTER — Encounter: Payer: Self-pay | Admitting: Family Medicine

## 2015-03-28 VITALS — BP 128/66 | HR 55 | Temp 98.2°F | Ht 66.75 in | Wt 160.5 lb

## 2015-03-28 DIAGNOSIS — K219 Gastro-esophageal reflux disease without esophagitis: Secondary | ICD-10-CM

## 2015-03-28 DIAGNOSIS — Z119 Encounter for screening for infectious and parasitic diseases, unspecified: Secondary | ICD-10-CM

## 2015-03-28 DIAGNOSIS — E785 Hyperlipidemia, unspecified: Secondary | ICD-10-CM | POA: Diagnosis not present

## 2015-03-28 DIAGNOSIS — Z0000 Encounter for general adult medical examination without abnormal findings: Secondary | ICD-10-CM | POA: Diagnosis not present

## 2015-03-28 DIAGNOSIS — M545 Low back pain: Secondary | ICD-10-CM | POA: Diagnosis not present

## 2015-03-28 DIAGNOSIS — Z23 Encounter for immunization: Secondary | ICD-10-CM | POA: Diagnosis not present

## 2015-03-28 MED ORDER — OMEPRAZOLE 40 MG PO CPDR: 40.0000 mg | DELAYED_RELEASE_CAPSULE | Freq: Two times a day (BID) | ORAL | Status: DC

## 2015-03-28 MED ORDER — SIMVASTATIN 20 MG PO TABS: ORAL_TABLET | ORAL | Status: DC

## 2015-03-28 NOTE — Progress Notes (Signed)
Pre visit review using our clinic review tool, if applicable. No additional management support is needed unless otherwise documented below in the visit note.  I have personally reviewed the Medicare Annual Wellness questionnaire and have noted 1. The patient's medical and social history 2. Their use of alcohol, tobacco or illicit drugs 3. Their current medications and supplements 4. The patient's functional ability including ADL's, fall risks, home safety risks and hearing or visual             impairment. 5. Diet and physical activities 6. Evidence for depression or mood disorders  The patients weight, height, BMI have been recorded in the chart and visual acuity is per eye clinic.  I have made referrals, counseling and provided education to the patient based review of the above and I have provided the pt with a written personalized care plan for preventive services.  Provider list updated- see scanned forms.  Routine anticipatory guidance given to patient.  See health maintenance.  Flu 2016 Shingles d/w pt.  PNA 2016 Tetanus 2006 Colonoscopy 2014 Prostate cancer screening- 2016 Advance directive- wife designated if patient were incapacitated.   Cognitive function addressed- see scanned forms- and if abnormal then additional documentation follows.  Pt opts in for HCV screening with the next set of labs.  D/w pt re: routine screening.    Elevated Cholesterol: Using medications without problems:yes Muscle aches: no, except below and likely not related to statin.  Diet compliance:yes Exercise:yes  Occ L lower back pain.  Lasts about 30 sec.  Noted when getting up from a chair, then dissipates.  Still walking well.  Going on for the last few weeks.  Still stretching.  No rash, no bruise, no trauma.  Tylenol helps some, when used at night, sleeping well.    He used OTC fibercon per GI with some relief.  He still has some GERD sx.  I had d/w his GI doc prev.  Pt is still not wanting to  go through the further invasive testing.  No blood in stool, no hematemesis.    PMH and SH reviewed  Meds, vitals, and allergies reviewed.   ROS: See HPI.  Otherwise negative.    GEN: nad, alert and oriented HEENT: mucous membranes moist NECK: supple w/o LA CV: rrr. Soft SEM noted.  PULM: ctab, no inc wob ABD: soft, +bs EXT: no edema SKIN: no acute rash Back w/o midline pain Tight hamstrings but SLR neg o/w.   No rash, no bruising No BLE weakness Gait wnl.  S/S grossly wnl BLE

## 2015-03-28 NOTE — Patient Instructions (Signed)
Use heat and/or ice on your lower back and keep stretching.  You likely pulled a muscle.   Your labs look good.  Take care.  Glad to see you.

## 2015-03-31 NOTE — Assessment & Plan Note (Addendum)
Per GI.  With cr okay, d/w pt okay to continue PPI as long as GI is in agreement.  Defer further w/u to pt/GI clinic.  He agrees.

## 2015-03-31 NOTE — Assessment & Plan Note (Signed)
Likely benign muscle strain, d/w pt.  See AVS.

## 2015-03-31 NOTE — Assessment & Plan Note (Signed)
Reasonable control, continue med as is, continue diet and exercise.  Labs d/w pt.

## 2015-03-31 NOTE — Assessment & Plan Note (Signed)
Flu 2016 Shingles d/w pt.  PNA 2016 Tetanus 2006 Colonoscopy 2014 Prostate cancer screening- 2016 Advance directive- wife designated if patient were incapacitated.   Cognitive function addressed- see scanned forms- and if abnormal then additional documentation follows.  Pt opts in for HCV screening with the next set of labs.  D/w pt re: routine screening.

## 2015-05-16 ENCOUNTER — Ambulatory Visit (INDEPENDENT_AMBULATORY_CARE_PROVIDER_SITE_OTHER): Payer: Medicare HMO | Admitting: Family Medicine

## 2015-05-16 ENCOUNTER — Encounter: Payer: Self-pay | Admitting: Family Medicine

## 2015-05-16 VITALS — BP 128/68 | HR 59 | Temp 98.7°F | Wt 161.8 lb

## 2015-05-16 DIAGNOSIS — J029 Acute pharyngitis, unspecified: Secondary | ICD-10-CM | POA: Diagnosis not present

## 2015-05-16 DIAGNOSIS — K219 Gastro-esophageal reflux disease without esophagitis: Secondary | ICD-10-CM

## 2015-05-16 LAB — POCT RAPID STREP A (OFFICE): Rapid Strep A Screen: NEGATIVE

## 2015-05-16 NOTE — Patient Instructions (Signed)
Likely either from GERD or a mild virus.  Take care.  Glad to see you.

## 2015-05-16 NOTE — Progress Notes (Signed)
ST.  Recently noted.  Started about 5 days ago.  No fevers.  Some cough.  Unclear if sx from GERD- diet was altered recently.  No ear pain.  No rhinorrhea except from baseline with allergies.  No wheeze.  Throat clearing.    Just had his 50th wedding anniversary and has a new grandson also.  I offered congrats on both.    Meds, vitals, and allergies reviewed.   ROS: See HPI.  Otherwise, noncontributory.  nad ncat Tm wnl Nasal exam wnl Mild op irritation, posteriorly, no exudates Mmm Neck supple, no LA rrr ctab

## 2015-05-18 NOTE — Assessment & Plan Note (Signed)
With ST.  RST neg.  Sx likely either from GERD or from a mild virus.  Okay for outpatient f/u.  Supportive care in meantime.  D/w pt about GERD options and he has been in contact with GI, I'll defer to GI clinic and patient.  He agrees.  Update me as needed.

## 2015-08-13 ENCOUNTER — Encounter: Payer: Self-pay | Admitting: Family Medicine

## 2015-08-13 ENCOUNTER — Ambulatory Visit (INDEPENDENT_AMBULATORY_CARE_PROVIDER_SITE_OTHER): Payer: PPO | Admitting: Family Medicine

## 2015-08-13 VITALS — BP 116/68 | HR 62 | Temp 98.3°F | Wt 163.5 lb

## 2015-08-13 DIAGNOSIS — R932 Abnormal findings on diagnostic imaging of liver and biliary tract: Secondary | ICD-10-CM | POA: Diagnosis not present

## 2015-08-13 DIAGNOSIS — K219 Gastro-esophageal reflux disease without esophagitis: Secondary | ICD-10-CM | POA: Diagnosis not present

## 2015-08-13 NOTE — Patient Instructions (Signed)
Let me check on the [facility] referral and the MRI arrangements.  Take care.  Glad to see you.

## 2015-08-13 NOTE — Progress Notes (Signed)
Pre visit review using our clinic review tool, if applicable. No additional management support is needed unless otherwise documented below in the visit note.  He was asking about gastric surgery for reflux.  He was specifically asking about a specific procedure, not a Nissen. Still on PPI.  Still with daily sx.   Asking about LINX surgery.  It could possibly be done at [facility]- I told patient I would check this for him.   Prev Gi studies, ie EGD d/w pt.   H/o liver nodule, and he needed MRI with and without to eval that.  H/o claustrophobia.  D/w pt.  Needs open MRI.  prev CT d/w pt re: liver lesion.   PMH and SH reviewed  ROS: See HPI, otherwise noncontributory.  Meds, vitals, and allergies reviewed.   Nad ncat Mmm rrr ctab abd soft, normal BS Ext well perfused.

## 2015-08-14 ENCOUNTER — Encounter: Payer: Self-pay | Admitting: Family Medicine

## 2015-08-14 DIAGNOSIS — R932 Abnormal findings on diagnostic imaging of liver and biliary tract: Secondary | ICD-10-CM | POA: Insufficient documentation

## 2015-08-14 NOTE — Assessment & Plan Note (Signed)
Will set up MRI with and w/o contrast.

## 2015-08-14 NOTE — Assessment & Plan Note (Signed)
Refer for possible LINX procedure at [facility].  See orders.

## 2015-08-26 ENCOUNTER — Ambulatory Visit
Admission: RE | Admit: 2015-08-26 | Discharge: 2015-08-26 | Disposition: A | Discharge status: Home / Self Care | Payer: PPO | Source: Ambulatory Visit | Attending: Family Medicine | Admitting: Family Medicine

## 2015-08-26 DIAGNOSIS — K7689 Other specified diseases of liver: Secondary | ICD-10-CM | POA: Diagnosis not present

## 2015-08-26 DIAGNOSIS — R932 Abnormal findings on diagnostic imaging of liver and biliary tract: Secondary | ICD-10-CM

## 2015-08-26 MED ORDER — GADOXETATE DISODIUM 0.25 MMOL/ML IV SOLN
7.0000 mL | Freq: Once | INTRAVENOUS | Status: AC | PRN
  Administered 2015-08-26: 7 mL via INTRAVENOUS

## 2015-08-27 ENCOUNTER — Telehealth: Payer: Self-pay | Admitting: Family Medicine

## 2015-08-27 NOTE — Telephone Encounter (Signed)
Patient returned Lugene's call about his MRI results.  Patient can be reached at home or on his cell phone.

## 2015-08-27 NOTE — Telephone Encounter (Signed)
Mr. Tones notified by telephone that his MRI was fine. Liver spot has imaging features are most suggestive of focal fatty deposition. Given the nearly 8 years of imaging stability, this can be considered a benign process. No f/u needed on this.

## 2015-11-17 DIAGNOSIS — H524 Presbyopia: Secondary | ICD-10-CM | POA: Diagnosis not present

## 2015-11-17 DIAGNOSIS — H11159 Pinguecula, unspecified eye: Secondary | ICD-10-CM | POA: Diagnosis not present

## 2015-11-17 DIAGNOSIS — H25099 Other age-related incipient cataract, unspecified eye: Secondary | ICD-10-CM | POA: Diagnosis not present

## 2015-11-17 DIAGNOSIS — H52223 Regular astigmatism, bilateral: Secondary | ICD-10-CM | POA: Diagnosis not present

## 2015-11-17 DIAGNOSIS — H5203 Hypermetropia, bilateral: Secondary | ICD-10-CM | POA: Diagnosis not present

## 2015-12-09 ENCOUNTER — Telehealth: Payer: Self-pay | Admitting: Family Medicine

## 2015-12-09 NOTE — Telephone Encounter (Signed)
LM for pt to sch CPE and AWV, after 03/27/2016, [place]

## 2016-03-28 ENCOUNTER — Other Ambulatory Visit: Payer: Self-pay | Admitting: Family Medicine

## 2016-03-28 DIAGNOSIS — E785 Hyperlipidemia, unspecified: Secondary | ICD-10-CM

## 2016-03-28 DIAGNOSIS — Z125 Encounter for screening for malignant neoplasm of prostate: Secondary | ICD-10-CM

## 2016-03-28 DIAGNOSIS — Z119 Encounter for screening for infectious and parasitic diseases, unspecified: Secondary | ICD-10-CM

## 2016-03-30 ENCOUNTER — Other Ambulatory Visit (INDEPENDENT_AMBULATORY_CARE_PROVIDER_SITE_OTHER): Payer: PPO

## 2016-03-30 DIAGNOSIS — E785 Hyperlipidemia, unspecified: Secondary | ICD-10-CM

## 2016-03-30 DIAGNOSIS — Z119 Encounter for screening for infectious and parasitic diseases, unspecified: Secondary | ICD-10-CM | POA: Diagnosis not present

## 2016-03-30 DIAGNOSIS — Z125 Encounter for screening for malignant neoplasm of prostate: Secondary | ICD-10-CM

## 2016-03-30 LAB — LIPID PANEL
CHOL/HDL RATIO: 3
Cholesterol: 168 mg/dL (ref 0–200)
HDL: 52.6 mg/dL (ref >39.00)
LDL CALC: 98 mg/dL (ref 0–99)
NonHDL: 115.35
TRIGLYCERIDES: 88 mg/dL (ref 0.0–149.0)
VLDL: 17.6 mg/dL (ref 0.0–40.0)

## 2016-03-30 LAB — COMPREHENSIVE METABOLIC PANEL
ALBUMIN: 4.1 g/dL (ref 3.5–5.2)
ALT: 13 U/L (ref 0–53)
AST: 16 U/L (ref 0–37)
Alkaline Phosphatase: 67 U/L (ref 39–117)
BILIRUBIN TOTAL: 0.5 mg/dL (ref 0.2–1.2)
BUN: 12 mg/dL (ref 6–23)
CALCIUM: 9.5 mg/dL (ref 8.4–10.5)
CHLORIDE: 104 meq/L (ref 96–112)
CO2: 28 meq/L (ref 19–32)
CREATININE: 0.77 mg/dL (ref 0.40–1.50)
GFR: 105.7 mL/min (ref >60.00)
Glucose, Bld: 94 mg/dL (ref 70–99)
Potassium: 4.6 mEq/L (ref 3.5–5.1)
SODIUM: 142 meq/L (ref 135–145)
Total Protein: 6.8 g/dL (ref 6.0–8.3)

## 2016-03-30 LAB — PSA, MEDICARE: PSA: 1.64 ng/mL (ref 0.10–4.00)

## 2016-03-31 LAB — HEPATITIS C ANTIBODY: HCV AB: NEGATIVE

## 2016-04-02 ENCOUNTER — Ambulatory Visit (INDEPENDENT_AMBULATORY_CARE_PROVIDER_SITE_OTHER): Payer: PPO | Admitting: Family Medicine

## 2016-04-02 ENCOUNTER — Encounter: Payer: Self-pay | Admitting: Family Medicine

## 2016-04-02 VITALS — BP 138/68 | HR 58 | Temp 98.9°F | Ht 67.0 in | Wt 161.0 lb

## 2016-04-02 DIAGNOSIS — R079 Chest pain, unspecified: Secondary | ICD-10-CM | POA: Diagnosis not present

## 2016-04-02 DIAGNOSIS — K219 Gastro-esophageal reflux disease without esophagitis: Secondary | ICD-10-CM | POA: Diagnosis not present

## 2016-04-02 DIAGNOSIS — Z0000 Encounter for general adult medical examination without abnormal findings: Secondary | ICD-10-CM | POA: Diagnosis not present

## 2016-04-02 DIAGNOSIS — Z23 Encounter for immunization: Secondary | ICD-10-CM | POA: Diagnosis not present

## 2016-04-02 DIAGNOSIS — E785 Hyperlipidemia, unspecified: Secondary | ICD-10-CM

## 2016-04-02 MED ORDER — OMEPRAZOLE 40 MG PO CPDR: 40.0000 mg | DELAYED_RELEASE_CAPSULE | Freq: Two times a day (BID) | ORAL | 3 refills | Status: DC

## 2016-04-02 MED ORDER — SIMVASTATIN 20 MG PO TABS: ORAL_TABLET | ORAL | 1 refills | Status: DC

## 2016-04-02 NOTE — Progress Notes (Signed)
Pre visit review using our clinic review tool, if applicable. No additional management support is needed unless otherwise documented below in the visit note. 

## 2016-04-02 NOTE — Patient Instructions (Addendum)
Check with your insurance to see if they will cover the shingles and tetanus shots.   Let me check to see if I can find anyone to do the LINX procedure.   Let me consider options about seeing the cardiology clinic.   If you have more symptoms, then let me know.  Take care.  Glad to see you.
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
## 2016-04-03 DIAGNOSIS — R079 Chest pain, unspecified: Secondary | ICD-10-CM | POA: Insufficient documentation

## 2016-04-03 DIAGNOSIS — R0789 Other chest pain: Secondary | ICD-10-CM | POA: Insufficient documentation

## 2016-04-03 NOTE — Assessment & Plan Note (Signed)
Flu today Shingles d/w pt. Encouraged.   PNA UTD Tetanus 2006, d/w pt. See AVS.  Colonoscopy 2014 PSA wnl, d/w pt.  Advance directive wife designated if patient were incapacitated.  Cognitive function addressed- see scanned forms- and if abnormal then additional documentation follows.  HCV neg.

## 2016-04-03 NOTE — Assessment & Plan Note (Signed)
Still with heartburn symptoms in spite of PPI use. I'm going to check to see if I can find a physician that can perform the LINX procedure, tosee if he would be a candidate. We'll go from there. He agrees.

## 2016-04-03 NOTE — Assessment & Plan Note (Signed)
Reasonable control. Labs discussed with patient. No adverse effect from statin. Continue as is. He agrees.

## 2016-04-03 NOTE — Assessment & Plan Note (Signed)
He had this one episode of chest pain that was exertional, he was working really hard digging in the yard at time. It resolved with rest. He's been able to exert in the meantime, but he is not been working as hard as with that episode. He doesn't have any chest pain now. He has no history of coronary disease. EKG reviewed. He has a history of significant GERD at baseline. This could've been what caused the pain, however the more I think about this more I believe we ought to have him see cardiology to see if he would be a candidate for treadmill test. We will notify patient.

## 2016-04-13 ENCOUNTER — Ambulatory Visit (INDEPENDENT_AMBULATORY_CARE_PROVIDER_SITE_OTHER): Payer: PPO | Admitting: Cardiology

## 2016-04-13 ENCOUNTER — Encounter: Payer: Self-pay | Admitting: Cardiology

## 2016-04-13 VITALS — BP 144/82 | HR 59 | Ht 66.0 in | Wt 161.8 lb

## 2016-04-13 DIAGNOSIS — R079 Chest pain, unspecified: Secondary | ICD-10-CM

## 2016-04-13 DIAGNOSIS — R011 Cardiac murmur, unspecified: Secondary | ICD-10-CM

## 2016-04-13 NOTE — Patient Instructions (Addendum)
Testing/Procedures: Your physician has requested that you have an echocardiogram. Echocardiography is a painless test that uses sound waves to create images of your heart. It provides your doctor with information about the size and shape of your heart and how well your heart's chambers and valves are working. This procedure takes approximately one hour. There are no restrictions for this procedure.  [place]  Your caregiver has ordered a Stress Test with nuclear imaging. The purpose of this test is to evaluate the blood supply to your heart muscle. This procedure is referred to as a "Non-Invasive Stress Test." This is because other than having an IV started in your vein, nothing is inserted or "invades" your body. Cardiac stress tests are done to find areas of poor blood flow to the heart by determining the extent of coronary artery disease (CAD). Some patients exercise on a treadmill, which naturally increases the blood flow to your heart, while others who are  unable to walk on a treadmill due to physical limitations have a pharmacologic/chemical stress agent called Lexiscan . This medicine will mimic walking on a treadmill by temporarily increasing your coronary blood flow.   Please note: these test may take anywhere between 2-4 hours to complete  PLEASE REPORT TO [facility] AT THE FIRST DESK WILL DIRECT YOU WHERE TO GO  Date of Procedure:_Friday April 23, 2016 at 07:30AM__  Arrival Time for Procedure:__Arrive at 07:15AM to register_____   PLEASE NOTIFY THE OFFICE AT LEAST 24 HOURS IN ADVANCE IF YOU ARE UNABLE TO [place].  [phone] AND  PLEASE NOTIFY NUCLEAR MEDICINE AT [facility] AT LEAST 24 HOURS IN ADVANCE IF YOU ARE UNABLE TO KEEP YOUR APPOINTMENT. [phone]  How to prepare for your Myoview test:  1. Do not eat or drink after midnight 2. No caffeine for 24 hours prior to test 3. No smoking 24 hours prior to test. 4. Your medication  may be taken with water.  If your doctor stopped a medication because of this test, do not take that medication. 5. Ladies, please do not wear dresses.  Skirts or pants are appropriate. Please wear a short sleeve shirt. 6. No perfume, cologne or lotion. 7. Wear comfortable walking shoes. No heels!      Follow-Up: Your physician recommends that you schedule a follow-up appointment as needed. We will call you with results and if needed schedule follow up at that time.   It was a pleasure seeing you today here in the office. Please do not hesitate to give [place] a call back if you have any further questions. [place], BSN    Echocardiogram An echocardiogram, or echocardiography, uses sound waves (ultrasound) to produce an image of your heart. The echocardiogram is simple, painless, obtained within a short period of time, and offers valuable information to your health care provider. The images from an echocardiogram can provide information such as:  Evidence of coronary artery disease (CAD).  Heart size.  Heart muscle function.  Heart valve function.  Aneurysm detection.  Evidence of a past heart attack.  Fluid buildup around the heart.  Heart muscle thickening.  Assess heart valve function. LET [facility] CARE PROVIDER KNOW ABOUT:  Any allergies you have.  All medicines you are taking, including vitamins, herbs, eye drops, creams, and over-the-counter medicines.  Previous problems you or members of your family have had with the use of anesthetics.  Any blood disorders you have.  Previous surgeries you have  had.  Medical conditions you have.  Possibility of pregnancy, if this applies. BEFORE THE PROCEDURE  No special preparation is needed. Eat and drink normally.  PROCEDURE   In order to produce an image of your heart, gel will be applied to your chest and a wand-like tool (transducer) will be moved over your chest. The gel will help transmit the sound  waves from the transducer. The sound waves will harmlessly bounce off your heart to allow the heart images to be captured in real-time motion. These images will then be recorded.  You may need an IV to receive a medicine that improves the quality of the pictures. AFTER THE PROCEDURE You may return to your normal schedule including diet, activities, and medicines, unless your health care provider tells you otherwise.   This information is not intended to replace advice given to you by your health care provider. Make sure you discuss any questions you have with your health care provider.   Document Released: 06/25/2000 Document Revised: 07/19/2014 Document Reviewed: 03/05/2013 Elsevier Interactive Patient Education 2016 [employer].     Cardiac Nuclear Scanning A cardiac nuclear scan is used to check your heart for problems, such as the following:  A portion of the heart is not getting enough blood.  Part of the heart muscle has died, which happens with a heart attack.  The heart wall is not working normally.  In this test, a radioactive dye (tracer) is injected into your bloodstream. After the tracer has traveled to your heart, a scanning device is used to measure how much of the tracer is absorbed by or distributed to various areas of your heart. LET [facility] CARE PROVIDER KNOW ABOUT:  Any allergies you have.  All medicines you are taking, including vitamins, herbs, eye drops, creams, and over-the-counter medicines.  Previous problems you or members of your family have had with the use of anesthetics.  Any blood disorders you have.  Previous surgeries you have had.  Medical conditions you have.  RISKS AND COMPLICATIONS Generally, this is a safe procedure. However, as with any procedure, problems can occur. Possible problems include:   Serious chest pain.  Rapid heartbeat.  Sensation of warmth in your chest. This usually passes quickly. BEFORE THE PROCEDURE Ask your  health care provider about changing or stopping your regular medicines. PROCEDURE This procedure is usually done at a hospital and takes 2-4 hours.  An IV tube is inserted into one of your veins.  Your health care provider will inject a small amount of radioactive tracer through the tube.  You will then wait for 20-40 minutes while the tracer travels through your bloodstream.  You will lie down on an exam table so images of your heart can be taken. Images will be taken for about 15-20 minutes.  You will exercise on a treadmill or stationary bike. While you exercise, your heart activity will be monitored with an electrocardiogram (ECG), and your blood pressure will be checked.  If you are unable to exercise, you may be given a medicine to make your heart beat faster.  When blood flow to your heart has peaked, tracer will again be injected through the IV tube.  After 20-40 minutes, you will get back on the exam table and have more images taken of your heart.  When the procedure is over, your IV tube will be removed. AFTER THE PROCEDURE  You will likely be able to leave shortly after the test. Unless your health care provider tells  you otherwise, you may return to your normal schedule, including diet, activities, and medicines.  Make sure you find out how and when you will get your test results.   This information is not intended to replace advice given to you by your health care provider. Make sure you discuss any questions you have with your health care provider.   Document Released: 07/23/2004 Document Revised: 07/03/2013 Document Reviewed: 06/06/2013 Elsevier Interactive Patient Education [employer].

## 2016-04-13 NOTE — Progress Notes (Signed)
Cardiology Office Note   Date:  04/13/2016   ID:  Kyle Sanchez, DOB 02-20-1946, MRN [id number]  Referring Doctor:  [name], MD   Cardiologist:   Wende Bushy, MD   Reason for consultation:  Chief Complaint  Patient presents with  . other    Ref by Dr. Damita Dunnings for chest pain. Meds reviewed by the pt. verbally.       History of Present Illness: Kyle Sanchez is a 70 y.o. male who presents for Chest pain. Patient had an episode roughly 2 weeks ago. He was working in his yard, digging up a bush. He was using this 20 pound bar to lift up the plant take out the root. He developed this hot sensation in his chest, pain moderate in intensity lasting 10 minutes at a time resolved with rest. Nonradiating. No associated shortness of breath.  Patient denies shortness of breath with exertion, palpitations, PND, orthopnea, edema. His chest pain sensation is different from his usual reflux disease   ROS:  Please see the history of present illness. Aside from mentioned under HPI, all other systems are reviewed and negative.     Past Medical History:  Diagnosis Date  . Allergic rhinitis   . Bell's palsy   . GERD (gastroesophageal reflux disease)    neg path on EGD 06/14/13  . Hemorrhoids   . History of migraines   . Hx of adenomatous colonic polyps 07/2002  . Hyperlipidemia   . Hypertension   . IBS (irritable bowel syndrome)   . Osteoarthritis     Past Surgical History:  Procedure Laterality Date  . CHOLECYSTECTOMY       reports that he has quit smoking. He has never used smokeless tobacco. He reports that he drinks alcohol. He reports that he does not use drugs.   family history includes Alcohol abuse in his father; Arthritis in his mother; Mental illness in his sister; Nephrolithiasis in his mother; Prostate cancer in his brother; Stroke in his mother.   Outpatient Medications Prior to Visit  Medication Sig Dispense Refill  . acetaminophen (TYLENOL ARTHRITIS PAIN) 650 MG  CR tablet Take 1 tablet (650 mg total) by mouth every 8 (eight) hours as needed for pain.    [name] Kitchen omeprazole (PRILOSEC) 40 MG capsule Take 1 capsule (40 mg total) by mouth 2 (two) times daily. 180 capsule 3  . simvastatin (ZOCOR) 20 MG tablet TAKE ONE-HALF TABLET BY MOUTH IN THE EVENING 90 tablet 1   No facility-administered medications prior to visit.      Allergies: Sucralfate    PHYSICAL EXAM: VS:  BP (!) 144/82 (BP Location: Right Arm, Patient Position: Sitting, Cuff Size: Normal)   Pulse (!) 59   Ht 5\' 6"  (1.676 m)   Wt 161 lb 12 oz (73.4 kg)   BMI 26.11 kg/m  , Body mass index is 26.11 kg/m. Wt Readings from Last 3 Encounters:  04/13/16 161 lb 12 oz (73.4 kg)  04/02/16 161 lb (73 kg)  08/13/15 163 lb 8 oz (74.2 kg)    GENERAL:  well developed, well nourished, not in acute distress HEENT: normocephalic, pink conjunctivae, anicteric sclerae, no xanthelasma, normal dentition, oropharynx clear NECK:  no neck vein engorgement, JVP normal, no hepatojugular reflux, carotid upstroke brisk and symmetric, no bruit, no thyromegaly, no lymphadenopathy LUNGS:  good respiratory effort, clear to auscultation bilaterally CV:  PMI not displaced, no thrills, no lifts, S1 and S2 within normal limits, no palpable S3 or S4, Soft systolic murmur,  no rubs, no gallops ABD:  Soft, nontender, nondistended, normoactive bowel sounds, no abdominal aortic bruit, no hepatomegaly, no splenomegaly MS: nontender back, no kyphosis, no scoliosis, no joint deformities EXT:  2+ DP/PT pulses, no edema, no varicosities, no cyanosis, no clubbing SKIN: warm, nondiaphoretic, normal turgor, no ulcers NEUROPSYCH: alert, oriented to person, place, and time, sensory/motor grossly intact, normal mood, appropriate affect  Recent Labs: 03/30/2016: ALT 13; BUN 12; Creatinine, Ser 0.77; Potassium 4.6; Sodium 142   Lipid Panel    Component Value Date/Time   CHOL 168 03/30/2016 0942   TRIG 88.0 03/30/2016 0942   HDL 52.60  03/30/2016 0942   CHOLHDL 3 03/30/2016 0942   VLDL 17.6 03/30/2016 0942   LDLCALC 98 03/30/2016 0942   LDLDIRECT 154.7 02/19/2013 1018     Other studies Reviewed:  EKG:  The ekg from 04/13/2016 was personally reviewed by me and it revealed Sinus rhythm, 59 BPM.  Additional studies/ records that were reviewed personally reviewed by me today include: None available   ASSESSMENT AND PLAN:  Chest pain Soft systolic murmur Risk factors for CAD include age and gender. Recommend to rule out ischemia with exercise nuclear stress is. Recommend echocardiogram as well.  Current medicines are reviewed at length with the patient today.  The patient does not have concerns regarding medicines.  Labs/ tests ordered today include:  Orders Placed This Encounter  Procedures  . NM Myocar Multi W/Spect W/Wall Motion / EF  . EKG 12-Lead  . ECHOCARDIOGRAM COMPLETE    I had a lengthy and detailed discussion with the patient regarding diagnoses, prognosis, diagnostic options, treatment options  and side effects of medications.   I counseled the patient on importance of lifestyle modification including heart healthy diet, regular physical activity once cardiac workup is completed.   Disposition:   FU with undersigned after tests    Signed, Wende Bushy, MD  04/13/2016 2:43 PM    [place]  This note was generated in part with voice recognition software and I apologize for any typographical errors that were not detected and corrected.

## 2016-04-18 ENCOUNTER — Telehealth: Payer: Self-pay | Admitting: Family Medicine

## 2016-04-18 NOTE — Telephone Encounter (Signed)
Call pt.  I see that he has the cardiology testing pending.  Glad he went to cards.  I checked to see if anyone around here was doing LINX.  I can't find anyone locally.  If he is going to see someone about it, it would likely have to be in [place] and [place].  We can refer later on, after cards testing done, but I didn't do anything else at this point.  Thanks.

## 2016-04-19 NOTE — Telephone Encounter (Signed)
Patient advised.

## 2016-04-23 ENCOUNTER — Ambulatory Visit
Admission: RE | Admit: 2016-04-23 | Discharge: 2016-04-23 | Disposition: A | Discharge status: Home / Self Care | Payer: PPO | Source: Ambulatory Visit | Attending: Cardiology | Admitting: Cardiology

## 2016-04-23 DIAGNOSIS — R079 Chest pain, unspecified: Secondary | ICD-10-CM

## 2016-04-23 LAB — NM MYOCAR MULTI W/SPECT W/WALL MOTION / EF
CHL CUP MPHR: 149 {beats}/min
CHL CUP NUCLEAR SDS: 0
CHL CUP RESTING HR STRESS: 61 {beats}/min
CSEPED: 9 min
CSEPEDS: 0 s
CSEPEW: 10.1 METS
LV sys vol: 17 mL
LVDIAVOL: 51 mL (ref 62–150)
Peak HR: 131 {beats}/min
Percent HR: 87 %
SRS: 0
SSS: 0
TID: 0.9

## 2016-04-23 MED ORDER — TECHNETIUM TC 99M TETROFOSMIN IV KIT
13.5300 | PACK | Freq: Once | INTRAVENOUS | Status: AC | PRN
  Administered 2016-04-23: 13.53 via INTRAVENOUS

## 2016-04-23 MED ORDER — TECHNETIUM TC 99M TETROFOSMIN IV KIT
32.4700 | PACK | Freq: Once | INTRAVENOUS | Status: AC | PRN
  Administered 2016-04-23: 32.47 via INTRAVENOUS

## 2016-05-03 ENCOUNTER — Ambulatory Visit (INDEPENDENT_AMBULATORY_CARE_PROVIDER_SITE_OTHER): Payer: PPO

## 2016-05-03 ENCOUNTER — Other Ambulatory Visit: Payer: Self-pay

## 2016-05-03 DIAGNOSIS — R079 Chest pain, unspecified: Secondary | ICD-10-CM

## 2016-09-02 DIAGNOSIS — K219 Gastro-esophageal reflux disease without esophagitis: Secondary | ICD-10-CM | POA: Diagnosis not present

## 2016-11-17 DIAGNOSIS — H25099 Other age-related incipient cataract, unspecified eye: Secondary | ICD-10-CM | POA: Diagnosis not present

## 2017-01-05 ENCOUNTER — Encounter: Payer: Self-pay | Admitting: Family Medicine

## 2017-01-05 ENCOUNTER — Ambulatory Visit (INDEPENDENT_AMBULATORY_CARE_PROVIDER_SITE_OTHER): Payer: PPO | Admitting: Family Medicine

## 2017-01-05 ENCOUNTER — Ambulatory Visit (INDEPENDENT_AMBULATORY_CARE_PROVIDER_SITE_OTHER)
Admission: RE | Admit: 2017-01-05 | Discharge: 2017-01-05 | Disposition: A | Discharge status: Home / Self Care | Payer: PPO | Source: Ambulatory Visit | Attending: Family Medicine | Admitting: Family Medicine

## 2017-01-05 VITALS — BP 116/76 | HR 70 | Temp 98.6°F | Wt 164.5 lb

## 2017-01-05 DIAGNOSIS — M25511 Pain in right shoulder: Secondary | ICD-10-CM | POA: Diagnosis not present

## 2017-01-05 DIAGNOSIS — M79601 Pain in right arm: Secondary | ICD-10-CM

## 2017-01-05 DIAGNOSIS — R200 Anesthesia of skin: Secondary | ICD-10-CM | POA: Diagnosis not present

## 2017-01-05 MED ORDER — ACETAMINOPHEN ER 650 MG PO TBCR: 650.0000 mg | EXTENDED_RELEASE_TABLET | Freq: Three times a day (TID) | ORAL | Status: DC | PRN

## 2017-01-05 NOTE — Patient Instructions (Signed)
Use the shoulder exercises in the meantime.  Go to the lab on the way out.  We'll contact you with your xray report. We need to consider options about the neck pain after I see your images.   Take care.  Glad to see you.

## 2017-01-05 NOTE — Progress Notes (Signed)
Shoulder pain.  Right arm.  Going on/off for about 6 months.  Pain sleeping on the R side at night.    No neck pain but the pain goes down to the R hand.  No L sided sx.  Numbness in the R but not L hand.    He was prev hit in the R shoulder as a teenager while batting/playing baseball and the arm went numb.  It resolved after a prolonged period of time.  He had decades without trouble with the meantime  Meds, vitals, and allergies reviewed.   ROS: Per HPI unless specifically indicated in ROS section   GEN: nad, alert and oriented HEENT: mucous membranes moist NECK: supple w/o LA, no midline pain but pain and decreased range of motion with rotation to the right. Normal rotation to the left. Normal flexion and extension. He does have some right-sided paraspinal neck tenderness. No rash. No bruises. CV: rrr PULM: ctab, no inc wob Right shoulder with intact range of motion but pain with overhead range of motion. No arm drop. AC joint is not tender. Internal rotation is normal but pain with external rotation. 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 Supraspinatus testing is not painful. Distally neurovascularly intact.

## 2017-01-06 ENCOUNTER — Encounter: Payer: Self-pay | Admitting: Family Medicine

## 2017-01-06 DIAGNOSIS — M79601 Pain in right arm: Secondary | ICD-10-CM | POA: Insufficient documentation

## 2017-01-06 DIAGNOSIS — M25511 Pain in right shoulder: Secondary | ICD-10-CM | POA: Insufficient documentation

## 2017-01-06 NOTE — Assessment & Plan Note (Signed)
Likely cuff pathology. 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 On exercises discussed with patient. Handout given. He will update me if not better. He agrees with plan. See notes on imaging. >25 minutes spent in face to face time with patient, >50% spent in counselling or coordination of care.

## 2017-01-06 NOTE — Assessment & Plan Note (Signed)
Likely separate from the shoulder issue. Discussed with patient. Likely with radicular source from the neck radiating down to the hand. Check neck films. He may be a candidate for gabapentin. We need to see what his imaging study reveals. We will contact patient.

## 2017-01-10 ENCOUNTER — Other Ambulatory Visit: Payer: Self-pay | Admitting: Family Medicine

## 2017-01-10 MED ORDER — GABAPENTIN 100 MG PO CAPS: 100.0000 mg | ORAL_CAPSULE | Freq: Three times a day (TID) | ORAL | 3 refills | Status: DC | PRN

## 2017-03-11 ENCOUNTER — Other Ambulatory Visit: Payer: Self-pay | Admitting: Family Medicine

## 2017-03-16 ENCOUNTER — Other Ambulatory Visit: Payer: Self-pay | Admitting: Family Medicine

## 2017-03-16 NOTE — Telephone Encounter (Signed)
Pt left v/m requesting status of gabapentin refill [facility] [address]. I spoke with Jasmine at [facility] [address]. Pt has been taking 2 caps tid. Jasmine said pt got # 90 on 01/25/17, 02/07/17 and 02/26/17. Do you want to refill?

## 2017-03-17 NOTE — Telephone Encounter (Signed)
Patient advised.

## 2017-03-17 NOTE — Telephone Encounter (Signed)
Sent.  Thanks.  Please get update from patient.  It his pain better?  What is his status?  Thanks.

## 2017-03-17 NOTE — Telephone Encounter (Signed)
Weight gain isn't the most common side effect with gabapentin.  He can try to cut back a little on the dose and see if the weight gain troubles get some better.  Thanks.  Glad he is better overall.

## 2017-03-17 NOTE — Telephone Encounter (Signed)
Patient states he is 100% better and that he has not felt like this in years.  He says his only problem is that he seems to be gaining weight since being on the medication.  He is careful about his diet and exercises mostly with stretching and weights but can't seem to keep his weight down.

## 2017-04-04 ENCOUNTER — Other Ambulatory Visit: Payer: Self-pay | Admitting: Family Medicine

## 2017-04-04 DIAGNOSIS — E785 Hyperlipidemia, unspecified: Secondary | ICD-10-CM

## 2017-04-04 DIAGNOSIS — Z125 Encounter for screening for malignant neoplasm of prostate: Secondary | ICD-10-CM

## 2017-04-05 ENCOUNTER — Other Ambulatory Visit (INDEPENDENT_AMBULATORY_CARE_PROVIDER_SITE_OTHER): Payer: PPO

## 2017-04-05 ENCOUNTER — Other Ambulatory Visit: Payer: Self-pay | Admitting: Family Medicine

## 2017-04-05 DIAGNOSIS — Z125 Encounter for screening for malignant neoplasm of prostate: Secondary | ICD-10-CM | POA: Diagnosis not present

## 2017-04-05 DIAGNOSIS — E785 Hyperlipidemia, unspecified: Secondary | ICD-10-CM

## 2017-04-05 LAB — COMPREHENSIVE METABOLIC PANEL
ALBUMIN: 4.3 g/dL (ref 3.5–5.2)
ALK PHOS: 63 U/L (ref 39–117)
ALT: 14 U/L (ref 0–53)
AST: 16 U/L (ref 0–37)
BILIRUBIN TOTAL: 0.7 mg/dL (ref 0.2–1.2)
BUN: 15 mg/dL (ref 6–23)
CO2: 33 mEq/L — ABNORMAL HIGH (ref 19–32)
CREATININE: 0.87 mg/dL (ref 0.40–1.50)
Calcium: 10 mg/dL (ref 8.4–10.5)
Chloride: 103 mEq/L (ref 96–112)
GFR: 91.55 mL/min (ref >60.00)
GLUCOSE: 101 mg/dL — AB (ref 70–99)
POTASSIUM: 4.6 meq/L (ref 3.5–5.1)
SODIUM: 142 meq/L (ref 135–145)
TOTAL PROTEIN: 6.9 g/dL (ref 6.0–8.3)

## 2017-04-05 LAB — LIPID PANEL
CHOLESTEROL: 189 mg/dL (ref 0–200)
HDL: 49.4 mg/dL (ref >39.00)
LDL Cholesterol: 122 mg/dL — ABNORMAL HIGH (ref 0–99)
NonHDL: 139.19
Total CHOL/HDL Ratio: 4
Triglycerides: 84 mg/dL (ref 0.0–149.0)
VLDL: 16.8 mg/dL (ref 0.0–40.0)

## 2017-04-05 LAB — PSA, MEDICARE: PSA: 1.73 ng/mL (ref 0.10–4.00)

## 2017-04-12 ENCOUNTER — Encounter: Payer: Self-pay | Admitting: Family Medicine

## 2017-04-12 ENCOUNTER — Ambulatory Visit (INDEPENDENT_AMBULATORY_CARE_PROVIDER_SITE_OTHER): Payer: PPO | Admitting: Family Medicine

## 2017-04-12 VITALS — BP 122/60 | HR 64 | Temp 98.4°F | Ht 66.0 in | Wt 167.8 lb

## 2017-04-12 DIAGNOSIS — R21 Rash and other nonspecific skin eruption: Secondary | ICD-10-CM | POA: Diagnosis not present

## 2017-04-12 DIAGNOSIS — E785 Hyperlipidemia, unspecified: Secondary | ICD-10-CM

## 2017-04-12 DIAGNOSIS — Z0000 Encounter for general adult medical examination without abnormal findings: Secondary | ICD-10-CM

## 2017-04-12 DIAGNOSIS — Z23 Encounter for immunization: Secondary | ICD-10-CM | POA: Diagnosis not present

## 2017-04-12 DIAGNOSIS — K219 Gastro-esophageal reflux disease without esophagitis: Secondary | ICD-10-CM

## 2017-04-12 DIAGNOSIS — M79601 Pain in right arm: Secondary | ICD-10-CM

## 2017-04-12 MED ORDER — SIMVASTATIN 20 MG PO TABS: ORAL_TABLET | ORAL | 1 refills | Status: DC

## 2017-04-12 MED ORDER — OMEPRAZOLE 40 MG PO CPDR: 40.0000 mg | DELAYED_RELEASE_CAPSULE | Freq: Two times a day (BID) | ORAL | 3 refills | Status: DC

## 2017-04-12 NOTE — Patient Instructions (Addendum)
Keep working on [employer].  Your cholesterol is okay for now.  Don't change your meds.  Keep doing the shoulder exercises and gradually cut back on the gabapentin.  Rosaria Ferries will call about your referral. Take your pictures of the spots when you go.   Check with your insurance to see if they will cover the shingrix shot. Take care.  Glad to see you.

## 2017-04-12 NOTE — Progress Notes (Signed)
I have personally reviewed the Medicare Annual Wellness questionnaire and have noted 1. The patient's medical and social history 2. Their use of alcohol, tobacco or illicit drugs 3. Their current medications and supplements 4. The patient's functional ability including ADL's, fall risks, home safety risks and hearing or visual             impairment. 5. Diet and physical activities 6. Evidence for depression or mood disorders  The patients weight, height, BMI have been recorded in the chart and visual acuity is per eye clinic.  I have made referrals, counseling and provided education to the patient based review of the above and I have provided the pt with a written personalized care plan for preventive services.  Provider list updated- see scanned forms.  Routine anticipatory guidance given to patient.  See health maintenance. The possibility exists that previously documented standard health maintenance information may have been brought forward from a previous encounter into this note.  If needed, that same information has been updated to reflect the current situation based on today's encounter.    Flu today Shingles d/w pt. Encouraged.   PNA UTD Tetanus 2006, d/w pt.  Colonoscopy 2014 PSA wnl, d/w pt.  Advance directive wife designated if patient were incapacitated.  Cognitive function addressed- see scanned forms- and if abnormal then additional documentation follows.  HCV neg prev.    GERD. Still on BID PPI and added on ranitidine 300mg  at night.  That helped some.    Elevated Cholesterol: Using medications without problems:yes Muscle aches: no Diet compliance:yes Exercise:yes Labs d/w pt.    His neck pain is clearly better on 200mg  gabapentin tid w/o ADE.   His shoulder pain is better with home exercises.  D/w pt.    He still has B hand rash (w/o relief after prior derm eval) and chronic IP OA changes.    PMH and SH reviewed  Meds, vitals, and allergies reviewed.   ROS:  Per HPI.  Unless specifically indicated otherwise in HPI, the patient denies:  General: fever. Eyes: acute vision changes ENT: sore throat Cardiovascular: chest pain Respiratory: SOB GI: vomiting GU: dysuria Musculoskeletal: acute back pain Derm: acute rash Neuro: acute motor dysfunction Psych: worsening mood Endocrine: polydipsia Heme: bleeding Allergy: hayfever  GEN: nad, alert and oriented HEENT: mucous membranes moist NECK: supple w/o LA CV: rrr. Faint SEM noted, old finding.   PULM: ctab, no inc wob ABD: soft, +bs EXT: no edema SKIN: he has itchy annular lesions on the extremities noted and dry flaky skins on the palms of his hands.

## 2017-04-14 NOTE — Assessment & Plan Note (Signed)
Flu today Shingles d/w pt. Encouraged.   PNA UTD Tetanus 2006, d/w pt.  Colonoscopy 2014 PSA wnl, d/w pt.  Advance directive wife designated if patient were incapacitated.  Cognitive function addressed- see scanned forms- and if abnormal then additional documentation follows.  HCV neg prev.

## 2017-04-14 NOTE — Assessment & Plan Note (Signed)
Improved with gabapentin, continue as is.  Can try to taper down in the future.

## 2017-04-14 NOTE — Assessment & Plan Note (Signed)
Reasonable to continue statin.  No ADE on med.  Continue diet and exercise.

## 2017-04-14 NOTE — Assessment & Plan Note (Signed)
Improved with addition of zantac 300mg , along with BID PPI.  Has seen GI in the past.

## 2017-04-14 NOTE — Assessment & Plan Note (Signed)
Refer to derm.  He agrees.  See AVS.

## 2017-04-20 DIAGNOSIS — I7300 Raynaud's syndrome without gangrene: Secondary | ICD-10-CM | POA: Diagnosis not present

## 2017-04-20 DIAGNOSIS — L821 Other seborrheic keratosis: Secondary | ICD-10-CM | POA: Diagnosis not present

## 2017-04-20 DIAGNOSIS — L309 Dermatitis, unspecified: Secondary | ICD-10-CM | POA: Diagnosis not present

## 2017-04-20 DIAGNOSIS — R21 Rash and other nonspecific skin eruption: Secondary | ICD-10-CM | POA: Diagnosis not present

## 2017-04-20 DIAGNOSIS — R238 Other skin changes: Secondary | ICD-10-CM | POA: Diagnosis not present

## 2017-04-20 DIAGNOSIS — L814 Other melanin hyperpigmentation: Secondary | ICD-10-CM | POA: Diagnosis not present

## 2017-04-20 DIAGNOSIS — D225 Melanocytic nevi of trunk: Secondary | ICD-10-CM | POA: Diagnosis not present

## 2017-04-20 DIAGNOSIS — L218 Other seborrheic dermatitis: Secondary | ICD-10-CM | POA: Diagnosis not present

## 2017-04-20 DIAGNOSIS — B353 Tinea pedis: Secondary | ICD-10-CM | POA: Diagnosis not present

## 2017-05-04 DIAGNOSIS — I7300 Raynaud's syndrome without gangrene: Secondary | ICD-10-CM | POA: Diagnosis not present

## 2017-05-04 DIAGNOSIS — L821 Other seborrheic keratosis: Secondary | ICD-10-CM | POA: Diagnosis not present

## 2017-05-04 DIAGNOSIS — L508 Other urticaria: Secondary | ICD-10-CM | POA: Diagnosis not present

## 2017-05-04 DIAGNOSIS — L309 Dermatitis, unspecified: Secondary | ICD-10-CM | POA: Diagnosis not present

## 2017-05-06 LAB — LAB REPORT - SCANNED
ANTINUCLEAR ANTIBODIES: NEGATIVE
ANTISCLERODERMA-70 ANTIBODIES: NEGATIVE
Creatinine, Ser: 0.79
Glucose: 107
Hemoglobin: 14.6
Platelets: 220
RHEUMATOID FACTOR: NEGATIVE
RNP Antibodies: NEGATIVE
Smith Antibodies: NEGATIVE
ds DNA Ab: NEGATIVE

## 2017-05-24 ENCOUNTER — Encounter: Payer: Self-pay | Admitting: Family Medicine

## 2017-07-20 ENCOUNTER — Ambulatory Visit (INDEPENDENT_AMBULATORY_CARE_PROVIDER_SITE_OTHER): Payer: PPO | Admitting: Family Medicine

## 2017-07-20 ENCOUNTER — Encounter: Payer: Self-pay | Admitting: Family Medicine

## 2017-07-20 VITALS — BP 126/66 | HR 65 | Temp 98.2°F | Wt 167.0 lb

## 2017-07-20 DIAGNOSIS — R6884 Jaw pain: Secondary | ICD-10-CM | POA: Insufficient documentation

## 2017-07-20 DIAGNOSIS — M26629 Arthralgia of temporomandibular joint, unspecified side: Secondary | ICD-10-CM | POA: Diagnosis not present

## 2017-07-20 MED ORDER — CYCLOBENZAPRINE HCL 10 MG PO TABS: 5.0000 mg | ORAL_TABLET | Freq: Two times a day (BID) | ORAL | 0 refills | Status: DC | PRN

## 2017-07-20 NOTE — Assessment & Plan Note (Addendum)
Story most consistent with TMJ dysfunction/arthritis however on exam painful area is inferior to TMJ on right. Still anticipate TMJ dysfunction. rec treat with continued tylenol (pt declines NSAIDs - ineffective), low dose flexeril with sedation precautions, and eccentric stretching exercises discussed. Reviewed with patient, handouts provided. Update if not improving with treatment. He does see dentist regularly.

## 2017-07-20 NOTE — Progress Notes (Signed)
BP 126/66 (BP Location: Left Arm, Patient Position: Sitting, Cuff Size: Normal)   Pulse 65   Temp 98.2 F (36.8 C) (Oral)   Wt 167 lb (75.8 kg)   SpO2 97%   BMI 26.95 kg/m    CC: R jaw pain Subjective:    Patient ID: Kyle Sanchez, male    DOB: 1945-08-03, 72 y.o.   MRN: [id number]  HPI: Kyle Sanchez is a 72 y.o. male presenting on 07/20/2017 for Jaw Pain ("Cracking" in right jaw when eating. And sometimes radiates down in the right ear. Started about 1 mo ago, worsened. Has taken Tylenol.  Feels better today.) and Discuss medications (Does not want to stop gabapentin)   1 mo h/o R jaw pain and "clicking" sensation when chewing. Sunday while eating felt R jaw lock and it was painful. Took tylenol which helped.   H/o jaw locking as a child age 11 yo while playing basketball. No trouble since.  Sees dentist Q6 months.  Does not chew gum.   Relevant past medical, surgical, family and social history reviewed and updated as indicated. Interim medical history since our last visit reviewed. Allergies and medications reviewed and updated. Outpatient Medications Prior to Visit  Medication Sig Dispense Refill  . Cholecalciferol (D3-1000) 1000 units capsule Take 1,000 Units by mouth daily. Take 2 capsules daily.    [name] Kitchen gabapentin (NEURONTIN) 100 MG capsule TAKE 1 TO 2 CAPSULES BY MOUTH THREE TIMES DAILY AS NEEDED 180 capsule 3  . Omega-3 Fatty Acids (RA FISH OIL) 1400 MG CPDR Take by mouth daily.    [name] Kitchen omeprazole (PRILOSEC) 40 MG capsule Take 1 capsule (40 mg total) by mouth 2 (two) times daily. 180 capsule 3  . ranitidine (ZANTAC) 300 MG capsule Take 300 mg by mouth every evening.    . simvastatin (ZOCOR) 20 MG tablet TAKE ONE-HALF TABLET BY MOUTH IN THE EVENING 90 tablet 1  . SUPER B COMPLEX/C PO Take by mouth daily.    [name] Kitchen Ubiquinol (QUNOL COQ10/UBIQUINOL/MEGA) 100 MG CAPS Take 100 mg by mouth daily.     No facility-administered medications prior to visit.      Per HPI unless specifically  indicated in ROS section below Review of Systems     Objective:    BP 126/66 (BP Location: Left Arm, Patient Position: Sitting, Cuff Size: Normal)   Pulse 65   Temp 98.2 F (36.8 C) (Oral)   Wt 167 lb (75.8 kg)   SpO2 97%   BMI 26.95 kg/m   Wt Readings from Last 3 Encounters:  07/20/17 167 lb (75.8 kg)  04/12/17 167 lb 12 oz (76.1 kg)  01/05/17 164 lb 8 oz (74.6 kg)    Physical Exam  Constitutional: He appears well-developed and well-nourished. No distress.  HENT:  Head: Normocephalic and atraumatic.    Right Ear: Hearing, tympanic membrane, external ear and ear canal normal.  Left Ear: Hearing, tympanic membrane and external ear normal.  Mouth/Throat: Uvula is midline and oropharynx is clear and moist. No oropharyngeal exudate, posterior oropharyngeal edema, posterior oropharyngeal erythema or tonsillar abscesses.  No significant pain at TMJ bilaterally No parotid fullness or mass No LAD Tender to palpation just below R TMJ along parotid without appreciable redness, swelling, warmth. Area of pain indicated with circle figure  Neck: Normal range of motion. Neck supple.  Lymphadenopathy:    He has no cervical adenopathy.  Nursing note and vitals reviewed.    Lab Results  Component Value Date   CREATININE 0.79 05/04/2017  Assessment & Plan:  States he'd like to remain on current gabapentin dose due to worsening pain when he tried just 1 tablet at a time. Advised to let PCP know - should be ok.  Problem List Items Addressed This Visit    Jaw pain - Primary    Story most consistent with TMJ dysfunction/arthritis however on exam painful area is inferior to TMJ on right. Still anticipate TMJ dysfunction. rec treat with continued tylenol (pt declines NSAIDs - ineffective), low dose flexeril with sedation precautions, and eccentric stretching exercises discussed. Reviewed with patient, handouts provided. Update if not improving with treatment. He does see dentist  regularly.      TMJ pain dysfunction syndrome       Follow up plan: No Follow-up on file.  Kyle Bush, MD

## 2017-07-20 NOTE — Patient Instructions (Addendum)
I think this is temporomandibular joint trouble - either arthritis or dysfunction Treat with stretching exercises as per below. May try flexeril muscle relaxant for jaw pain. Avoid repetitive chewing if you can. Let Dr Damita Dunnings know about gabapentin.  Let us know if not improving with treatment.   Sndrome de la articulacin temporomandibular (Temporomandibular Joint Syndrome) El sndrome de Sports coach (ATM) afecta las articulaciones que se encuentran entre la [place] y el crneo. Las articulaciones temporomandibulares estn ubicadas cerca de las orejas y permiten que la [place] se abra y se cierre. Estas articulaciones y los msculos adyacentes intervienen en todos los movimientos de la [place]. Las Engineer, manufacturing con sndrome de la articulacin temporomandibular tienen dolor en la zona de estas articulaciones y [employer]. Masticar, morder u Field seismologist otros movimientos con la mandbula puede ser difcil o doloroso. Las causas de este sndrome pueden ser diversas. En muchos casos, la afeccin es leve y desaparece en el trmino de unas pocas semanas. En algunas personas, la afeccin puede convertirse en un problema prolongado. CAUSAS Las causas posibles del sndrome de la articulacin temporomandibular incluyen lo siguiente:  Licensed conveyancer (bruxismo) o Engineer, maintenance (IT). Algunas personas lo hacen cuando estn bajo estrs.  Artritis.  Lesin mandibular.  Lesin en la cabeza o el cuello.  Piezas dentales o dentaduras postizas que no estn bien alineadas. En algunos casos, es posible que la causa de este sndrome no se conozca. [place] sntoma ms comn es un dolor continuo al costado de la cabeza en la zona de la articulacin temporomandibular. Otros sntomas pueden ser los siguientes:  Dolor al mover la [place], por ejemplo, al [employer] o morder.  Imposibilidad de abrir [employer].  Producir un chasquido al abrir la  boca.  Dolor de [place].  Dolor de odos.  Dolor en el cuello o el hombro. DIAGNSTICO Generalmente, se puede hacer el diagnstico en funcin de los sntomas, la historia clnica y un examen fsico. El mdico puede revisar el rango de movimiento de la [place]. A veces se realizan estudios de diagnstico por imgenes, como radiografas o resonancias magnticas (RM). Tal vez deba consultar al dentista para que determine si las piezas dentales y la mandbula estn alineadas correctamente. TRATAMIENTO El sndrome de la articulacin temporomandibular suele desaparecer solo. Si es Arts development officer, las opciones pueden incluir lo siguiente:  Consumir alimentos blandos y Midwife hielo o Freight forwarder.  Medicamentos para Best boy o la inflamacin.  Medicamentos para Scientist, research (life sciences).  Una placa oclusal, una placa de mordida o una boquilla para evitar que se rechinen los dientes o se aprieten las [place].  Tcnicas de relajacin o psicoterapia para ayudar a Software engineer.  Neuroestimulacin elctrica transcutnea (NET). Esto ayuda a Best boy al aplicar una corriente elctrica a travs de la piel.  Acupuntura. A veces, esta opcin ayuda a Best boy.  Ciruga de mandbula que debe realizarse en contadas ocasiones. [place] los medicamentos solamente como se lo haya indicado el mdico.  Consuma una dieta blanda si tiene dificultades para Engineer, manufacturing systems.  Aplique hielo sobre la zona dolorida. ? Ponga el hielo en una bolsa plstica. ? Coloque una [employer] piel y la bolsa de hielo. ? Coloque el hielo durante 20 minutos, 2 a 3 veces por da.  Aplique una compresa tibia sobre la zona dolorida como se lo hayan indicado.  Hgase masajes en la zona de la mandbula y haga ejercicios  de estiramiento de la [employer] se lo haya recomendado el mdico.  Si le indicaron que use una boquilla o una placa de mordida,  hgalo como se lo indicaron.  No consuma los alimentos que Energy manager. No mastique goma de Higher education careers adviser.  Concurra a todas las visitas de control como se lo haya indicado el mdico. Esto es importante. SOLICITE ATENCIN MDICA SI:  Tiene problemas para comer.  Tiene sntomas nuevos o estos empeoran. SOLICITE ATENCIN MDICA DE INMEDIATO SI:  Se le queda trabada la mandbula abierta o cerrada. Esta informacin no tiene Marine scientist el consejo del mdico. Asegrese de hacerle al mdico cualquier pregunta que tenga. Document Released: 04/07/2005 Document Revised: 07/19/2014 Document Reviewed: 01/31/2014 Elsevier Interactive Patient Education  2018 [place] de amplitud de movimiento de la mandbula (Jaw Range of Motion Exercises) Los ejercicios de amplitud de movimiento de la mandbula ayudan a Risk analyst la [place]. Estos ejercicios pueden ayudar a prevenir lo siguiente:  Dificultad para abrir Equities trader.  Dolor en la [place], tanto cuando est abierta como cuando est cerrada. [place]? Asegrese de hacer ejercicios con la mandbula solamente como se lo haya indicado el mdico. Debe mover la mandbula solamente hasta donde llegue en cada direccin si el mdico se lo ha indicado. No mueva la mandbula hasta una posicin que le cause dolor. QU EJERCICIOS DEBO HACER?  Lleve la mandbula hacia adelante. Mantenga esta posicin durante 1o 2segundos. Deje que la mandbula regrese a su posicin normal y Careers information officer as durante 1o 2segundos. Haga este ejercicio 8veces.  Pngase frente a un espejo, ya sea de pie o sentado. Coloque la [employer] parte superior de la boca, justo por detrs de los dientes superiores. Sherlon Handing y cierre lentamente la mandbula, manteniendo la lengua en la parte superior de la boca. Mientras abre y Patent examiner, trate de no llevar la [employer] un lado o el otro.  Repita este movimiento 8veces.  Mueva la mandbula hacia la derecha. Mantenga esta posicin durante 1o 2segundos. Deje que la mandbula regrese a su posicin normal y Careers information officer as durante 1o 2segundos. Haga este ejercicio 8veces.  Mueva la [employer] izquierda. Mantenga esta posicin durante 1o 2segundos. Deje que la mandbula regrese a su posicin normal y Careers information officer as durante 1o 2segundos. Haga este ejercicio 8veces.  Abra la boca todo lo que pueda sin que le cause molestias. Mantenga esta posicin durante 1o 2segundos. Luego cierre la boca y descanse durante 1o 2segundos. Haga este ejercicio 8veces.  Haga movimientos circulares con la mandbula, comenzando hacia la derecha (en la direccin de las agujas del reloj). Repita este movimiento 8veces.  Haga movimientos circulares con la mandbula, comenzando hacia la izquierda (en direccin contraria a las agujas del reloj). Repita este movimiento 8veces. Aplquese compresas de calor hmedas o de hielo en la mandbula antes o despus de realizar los ejercicios como se lo haya indicado el mdico. QU MS PUEDO HACER? Evite hacer lo siguiente si esto le causa dolor o Engineer, water en la mandbula:  Engineer, manufacturing systems goma de Higher education careers adviser.  Apretar la [employer] dientes, o tensionar los msculos de la [place].  Apoyarse sobre la [place], por ejemplo, apoyar la [employer] la mano al estar sentado en el escritorio. Esta informacin no tiene Marine scientist el consejo del mdico. Asegrese de hacerle al mdico cualquier pregunta que tenga. Document Released: 10/13/2010 Document Revised: 07/19/2014 Document  Reviewed: 05/29/2014 Elsevier Interactive Patient Education  [employer].

## 2017-10-22 ENCOUNTER — Other Ambulatory Visit: Payer: Self-pay | Admitting: Family Medicine

## 2017-10-24 NOTE — Telephone Encounter (Signed)
Electronic refill request Last office visit 07/20/17/acute Last refill 03/17/17 #180/3

## 2017-10-25 NOTE — Telephone Encounter (Signed)
Sent. Thanks.   

## 2017-11-02 ENCOUNTER — Encounter: Payer: Self-pay | Admitting: Family Medicine

## 2017-11-02 ENCOUNTER — Ambulatory Visit (INDEPENDENT_AMBULATORY_CARE_PROVIDER_SITE_OTHER): Payer: PPO | Admitting: Family Medicine

## 2017-11-02 DIAGNOSIS — M1990 Unspecified osteoarthritis, unspecified site: Secondary | ICD-10-CM

## 2017-11-02 MED ORDER — TRAMADOL HCL 50 MG PO TABS: 50.0000 mg | ORAL_TABLET | Freq: Four times a day (QID) | ORAL | 0 refills | Status: DC | PRN

## 2017-11-02 NOTE — Progress Notes (Signed)
Joint aches.  L 2nd finger with chronic IP changes with swelling about 2 weeks ago.  Some improvement in the swelling but still with grip changes from pain.  "I always have pain."   He was on gabapentin for R arm pain with some improvement but that was clearly a different pain.  When he tapered off that he had more R arm pain.    He can't make a full fist with L hand.  Stiff in the AM.    He has sig hx of GERD and it would make sense to try to avoid prednisone.  Tylenol didn't help.    Meds, vitals, and allergies reviewed.   ROS: Per HPI unless specifically indicated in ROS section   nad ncat Neck supple, no LA R shoulder with normal ROM No arm drop.  Right shoulder with some pain on internal rotation, less pain with external rotation.  The right lateral portion of the pectoralis major is tender to palpation. rrr ctab Bilateral hand exam with significant chronic IP degenerative changes noted, especially on the left second DIP and PIP.  Left hand with inability to make a full fist due to arthritis.

## 2017-11-02 NOTE — Patient Instructions (Signed)
Okay to take tramadol with tylenol.  I would avoid ibuprofen or aleve.  Sedation caution on tramadol.  If not better, then ask to see Dr. Lorelei Pont.  Take care.  Glad to see you.

## 2017-11-03 DIAGNOSIS — M1990 Unspecified osteoarthritis, unspecified site: Secondary | ICD-10-CM | POA: Insufficient documentation

## 2017-11-03 NOTE — Assessment & Plan Note (Signed)
Likely osteoarthritis.  Discussed with patient about options.  He can try tramadol.  Routine cautions given.  If not better with that then I want him to follow-up with Dr. Lorelei Pont for consideration of injection.  He agrees.  Would avoid NSAIDs and prednisone given his difficulty with GERD previously.  He agrees with plan.

## 2017-12-12 ENCOUNTER — Ambulatory Visit (INDEPENDENT_AMBULATORY_CARE_PROVIDER_SITE_OTHER): Payer: PPO | Admitting: Family Medicine

## 2017-12-12 ENCOUNTER — Encounter: Payer: Self-pay | Admitting: Family Medicine

## 2017-12-12 VITALS — BP 124/62 | HR 62 | Temp 98.5°F | Ht 66.0 in | Wt 162.0 lb

## 2017-12-12 DIAGNOSIS — M2550 Pain in unspecified joint: Secondary | ICD-10-CM

## 2017-12-12 DIAGNOSIS — M19041 Primary osteoarthritis, right hand: Secondary | ICD-10-CM | POA: Diagnosis not present

## 2017-12-12 DIAGNOSIS — M19042 Primary osteoarthritis, left hand: Secondary | ICD-10-CM | POA: Diagnosis not present

## 2017-12-12 DIAGNOSIS — I7300 Raynaud's syndrome without gangrene: Secondary | ICD-10-CM

## 2017-12-12 LAB — CBC WITH DIFFERENTIAL/PLATELET
BASOS ABS: 0.1 10*3/uL (ref 0.0–0.1)
BASOS PCT: 2.2 % (ref 0.0–3.0)
EOS ABS: 0.4 10*3/uL (ref 0.0–0.7)
Eosinophils Relative: 6.2 % — ABNORMAL HIGH (ref 0.0–5.0)
HEMATOCRIT: 44.1 % (ref 39.0–52.0)
Hemoglobin: 14.6 g/dL (ref 13.0–17.0)
LYMPHS ABS: 1.6 10*3/uL (ref 0.7–4.0)
Lymphocytes Relative: 25.7 % (ref 12.0–46.0)
MCHC: 33.2 g/dL (ref 30.0–36.0)
MCV: 87.9 fl (ref 78.0–100.0)
MONO ABS: 0.7 10*3/uL (ref 0.1–1.0)
Monocytes Relative: 11.1 % (ref 3.0–12.0)
NEUTROS ABS: 3.5 10*3/uL (ref 1.4–7.7)
NEUTROS PCT: 54.8 % (ref 43.0–77.0)
PLATELETS: 230 10*3/uL (ref 150.0–400.0)
RBC: 5.02 Mil/uL (ref 4.22–5.81)
RDW: 14.7 % (ref 11.5–15.5)
WBC: 6.4 10*3/uL (ref 4.0–10.5)

## 2017-12-12 LAB — HIGH SENSITIVITY CRP: CRP HIGH SENSITIVITY: 1 mg/L (ref 0.000–5.000)

## 2017-12-12 LAB — SEDIMENTATION RATE: Sed Rate: 9 mm/hr (ref 0–20)

## 2017-12-12 LAB — URIC ACID: URIC ACID, SERUM: 5.8 mg/dL (ref 4.0–7.8)

## 2017-12-12 MED ORDER — DEXAMETHASONE SODIUM PHOSPHATE 100 MG/10ML IJ SOLN
10.0000 mg | Freq: Once | INTRAMUSCULAR | Status: AC
  Administered 2017-12-12: 10 mg via INTRAMUSCULAR

## 2017-12-12 MED ORDER — DICLOFENAC SODIUM 1.5 % TD SOLN: TRANSDERMAL | 5 refills | Status: DC

## 2017-12-12 NOTE — Progress Notes (Signed)
Dr. Frederico Hamman T. Yurianna Tusing, MD, [facility] [place], [address] Phone: [phone] Fax: [phone]  12/12/2017  Patient: Kyle Sanchez, MRN: [id number], DOB: 11/05/1945, 72 y.o.  Primary Physician:  Tonia Ghent, MD   Chief Complaint  Patient presents with  . Arthritis    Left Index Finger   Subjective:   Zamarion Ballo is a 72 y.o. 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  He is not able to bend this well at all, and this is significantly impairing his function and the use of his left finger.  He describes morning pain that lasts for many hours upon waking.  He also describes a history of Raynaud's phenomenon, where he clearly has white coloration of his fingers in the wintertime when he goes outside or when he places his hands into the freezer.  He denies a specific known history of rheumatoid arthritis, psoriatic arthritis, lupus or other rheumatological conditions in his family.  He denies extensive hand traumatic fractures in the past.  2013 ANA, RhF, ESR were normal.   Reynaud's phenomenon  Diffuse OA.  Past Medical History, Surgical History, Social History, Family History, Problem List, Medications, and Allergies have been reviewed and updated if relevant.  Patient Active Problem List   Diagnosis Date Noted  . Arthritis 11/03/2017  . Jaw pain 07/20/2017  . Right arm pain 01/06/2017  . Right shoulder pain 01/06/2017  . Chest pain 04/03/2016  . Tinnitus of both ears 09/02/2014  . Advance care planning 03/28/2014  . 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 Back pain 12/10/2013  . Rash 04/11/2012  . Medicare  annual wellness visit, subsequent 02/22/2012  . FH: prostate cancer 02/18/2011  . MYALGIA 04/21/2010  . HEMORRHOIDS 06/27/2008  . Irritable bowel syndrome 04/24/2008  . ECZEMA 06/27/2007  . HLD (hyperlipidemia) 09/27/2006  . ERECTILE DYSFUNCTION 09/27/2006  . ALLERGIC RHINITIS 09/27/2006  . Esophageal reflux 09/27/2006  . OSTEOARTHROSIS, GENERALIZED, UNSPC SITE 09/27/2006  . SLEEP APNEA 09/27/2006  . COLONIC POLYPS, HX OF 09/27/2006    Past Medical History:  Diagnosis Date  . Allergic rhinitis   . Bell's palsy   . GERD (gastroesophageal reflux disease)    neg path on EGD 06/14/13  . Hemorrhoids   . History of migraines   . Hx of adenomatous colonic polyps 07/2002  . Hyperlipidemia   . Hypertension   . IBS (irritable bowel syndrome)   . Osteoarthritis     Past Surgical History:  Procedure Laterality Date  . CHOLECYSTECTOMY      Social History   Socioeconomic History  . Marital status: Married    Spouse name: Not on file  . Number of children: 2  . Years of education: Not on file  . Highest education level: Not on file  Occupational History  . 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 Financial resource strain: Not on file  . Food insecurity:    Worry: Not on file    Inability: Not on file  . Transportation needs:    Medical: Not on file    Non-medical: Not on file  Tobacco Use  .  Smoking status: Former Research scientist (life sciences)  . Smokeless tobacco: Never Used  . Tobacco comment: Stopped 30 years ago   Substance and Sexual Activity  . Alcohol use: No    Alcohol/week: 0.0 oz  . Drug use: No  . Sexual activity: Yes    Partners: Female  Lifestyle  . Physical activity:    Days per week: Not on file    Minutes per session: Not on file  . Stress: Not on file  Relationships  . Social connections:    Talks on phone: Not on file    Gets together: Not on file    Attends religious service: Not on file    Active member of club or  organization: Not on file    Attends meetings of clubs or organizations: Not on file    Relationship status: Not on file  . Intimate partner violence:    Fear of current or ex partner: Not on file    Emotionally abused: Not on file    Physically abused: Not on file    Forced sexual activity: Not on file  Other Topics Concern  . Not on file  Social History Narrative   From [place], [place] since 1995   Married 1966    Family History  Problem Relation Age of Onset  . Nephrolithiasis Mother   . Arthritis Mother   . Stroke Mother   . Alcohol abuse Father   . Mental illness Sister   . Prostate cancer Brother   . Colon cancer Neg Hx     Allergies  Allergen Reactions  . Sucralfate     Dry mouth, aches    Medication list reviewed and updated in full in [place].  GEN: No fevers, chills. Nontoxic. Primarily MSK c/o today. MSK: Detailed in the HPI GI: tolerating PO intake without difficulty Neuro: No numbness, parasthesias, or tingling associated. Otherwise the pertinent positives of the ROS are noted above.   Objective:   BP 124/62   Pulse 62   Temp 98.5 F (36.9 C) (Oral)   Ht _0  (1.676 m)   Wt 162 lb (73.5 kg)   BMI 26.15 kg/m    GEN: WDWN, NAD, Non-toxic, Alert & Oriented x 3 HEENT: Atraumatic, Normocephalic.  Ears and Nose: No external deformity. EXTR: No clubbing/cyanosis/edema NEURO: Normal gait.  PSYCH: Normally interactive. Conversant. Not depressed or anxious appearing.  Calm demeanor.    The patient has extensive arthritic changes at every joint in his hands including all DIP joints, all PIP joints, as well as all MCP joints.  He has significant arthritic changes at the [facility] joints on the first additionally.  He also has limited range of motion at the wrist itself, with an approximate loss of 25% of motion overall at the wrist.  Globally he does have some bogginess in multiple PIP and DIP joints and to a lesser extent the MCP joints.  He  is unable to make a complete composite fist on the left side, most notably on the second digit, but there is some impairment overall.  Grip strength is significantly decreased bilaterally.  On the left second digit there is at least a 50% loss of motion at the PIP joint in terms of flexion.  Extension appears to be almost entirely preserved.  Radiology: No results found.  Assessment and Plan:   Primary osteoarthritis of both hands  Polyarthralgia - Plan: CBC with Differential/Platelet, Cyclic citrul peptide antibody, IgG, Rheumatoid factor, ANA, Uric acid, High sensitivity CRP,  Sedimentation rate, dexamethasone (DECADRON) injection 10 mg  Raynaud's phenomenon without gangrene  Clinical symptoms could correlate with a systemic rheumatological condition, and I am going to do a basic rheumatological work-up.  At this time, sed rate, CRP, CCP antibodies as well as rheumatoid factor of all come back as negative.  Dramatic hand osteoarthritis, essentially all joints.  I am going to give him a dose of Decadron IM in the office, and give him some topical Voltaren to see if this helps with his symptoms.  At the second PIP joint, he has dramatic loss of motion.  I do not think that a small joint steroid injection would provide any definitive solution.  My recommendation to the patient was to strongly consider a PIP joint joint replacement, and at the very least discuss this with 1 of the hand surgeons.  I think that this could give him a lasting, dramatic functional improvement.  For now he is going to think on it and see how he feels after the Decadron and Voltaren.  Follow-up: prn only  Meds ordered this encounter  Medications  . Diclofenac Sodium 1.5 % SOLN    Sig: Apply 4 times a day prn pain    Dispense:  1 Bottle    Refill:  5  . dexamethasone (DECADRON) injection 10 mg   Orders Placed This Encounter  Procedures  . CBC with Differential/Platelet  . Cyclic citrul peptide antibody, IgG    . Rheumatoid factor  . ANA  . Uric acid  . High sensitivity CRP  . Sedimentation rate    Signed,  Kawehi Hostetter T. Weaver Tweed, MD   Allergies as of 12/12/2017      Reactions   Sucralfate    Dry mouth, aches      Medication List        Accurate as of 12/12/17 11:59 PM. Always use your most recent med list.          D3-1000 1000 units capsule Generic drug:  Cholecalciferol Take 1,000 Units by mouth daily. Take 2 capsules daily.   Diclofenac Sodium 1.5 % Soln Apply 4 times a day prn pain   gabapentin 100 MG capsule Commonly known as:  NEURONTIN TAKE 1 TO 2 CAPSULES BY MOUTH THREE TIMES DAILY AS NEEDED   omeprazole 40 MG capsule Commonly known as:  PRILOSEC Take 1 capsule (40 mg total) by mouth 2 (two) times daily.   QUNOL COQ10/UBIQUINOL/MEGA 100 MG Caps Generic drug:  Ubiquinol Take 100 mg by mouth daily.   RA FISH OIL 1400 MG Cpdr Take by mouth daily.   simvastatin 20 MG tablet Commonly known as:  ZOCOR TAKE ONE-HALF TABLET BY MOUTH IN THE EVENING   SUPER B COMPLEX/C PO Take by mouth daily.   traMADol 50 MG tablet Commonly known as:  ULTRAM Take 1 tablet (50 mg total) by mouth every 6 (six) hours as needed.

## 2017-12-14 LAB — ANA: Anti Nuclear Antibody(ANA): NEGATIVE

## 2017-12-14 LAB — RHEUMATOID FACTOR: Rhuematoid fact SerPl-aCnc: <14 IU/mL (ref <14)

## 2017-12-14 LAB — CYCLIC CITRUL PEPTIDE ANTIBODY, IGG: Cyclic Citrullin Peptide Ab: <16 UNITS

## 2018-01-18 DIAGNOSIS — H5203 Hypermetropia, bilateral: Secondary | ICD-10-CM | POA: Diagnosis not present

## 2018-01-18 DIAGNOSIS — H52223 Regular astigmatism, bilateral: Secondary | ICD-10-CM | POA: Diagnosis not present

## 2018-01-18 DIAGNOSIS — H25099 Other age-related incipient cataract, unspecified eye: Secondary | ICD-10-CM | POA: Diagnosis not present

## 2018-01-18 DIAGNOSIS — H11159 Pinguecula, unspecified eye: Secondary | ICD-10-CM | POA: Diagnosis not present

## 2018-01-18 DIAGNOSIS — H524 Presbyopia: Secondary | ICD-10-CM | POA: Diagnosis not present

## 2018-04-13 ENCOUNTER — Other Ambulatory Visit: Payer: Self-pay | Admitting: Family Medicine

## 2018-04-13 DIAGNOSIS — Z125 Encounter for screening for malignant neoplasm of prostate: Secondary | ICD-10-CM

## 2018-04-13 DIAGNOSIS — E785 Hyperlipidemia, unspecified: Secondary | ICD-10-CM

## 2018-04-14 ENCOUNTER — Other Ambulatory Visit (INDEPENDENT_AMBULATORY_CARE_PROVIDER_SITE_OTHER): Payer: PPO

## 2018-04-14 DIAGNOSIS — Z125 Encounter for screening for malignant neoplasm of prostate: Secondary | ICD-10-CM | POA: Diagnosis not present

## 2018-04-14 DIAGNOSIS — E785 Hyperlipidemia, unspecified: Secondary | ICD-10-CM

## 2018-04-14 LAB — COMPREHENSIVE METABOLIC PANEL
ALT: 15 U/L (ref 0–53)
AST: 18 U/L (ref 0–37)
Albumin: 4.3 g/dL (ref 3.5–5.2)
Alkaline Phosphatase: 62 U/L (ref 39–117)
BUN: 18 mg/dL (ref 6–23)
CALCIUM: 9.8 mg/dL (ref 8.4–10.5)
CHLORIDE: 104 meq/L (ref 96–112)
CO2: 34 meq/L — AB (ref 19–32)
CREATININE: 0.92 mg/dL (ref 0.40–1.50)
GFR: 85.58 mL/min (ref >60.00)
Glucose, Bld: 98 mg/dL (ref 70–99)
Potassium: 4.4 mEq/L (ref 3.5–5.1)
Sodium: 143 mEq/L (ref 135–145)
Total Bilirubin: 0.7 mg/dL (ref 0.2–1.2)
Total Protein: 6.9 g/dL (ref 6.0–8.3)

## 2018-04-14 LAB — LIPID PANEL
CHOL/HDL RATIO: 4
Cholesterol: 176 mg/dL (ref 0–200)
HDL: 49 mg/dL (ref >39.00)
LDL CALC: 112 mg/dL — AB (ref 0–99)
NONHDL: 127.39
TRIGLYCERIDES: 78 mg/dL (ref 0.0–149.0)
VLDL: 15.6 mg/dL (ref 0.0–40.0)

## 2018-04-14 LAB — PSA, MEDICARE: PSA: 2.07 ng/ml (ref 0.10–4.00)

## 2018-04-21 ENCOUNTER — Ambulatory Visit (INDEPENDENT_AMBULATORY_CARE_PROVIDER_SITE_OTHER): Payer: PPO | Admitting: Family Medicine

## 2018-04-21 ENCOUNTER — Encounter: Payer: Self-pay | Admitting: Family Medicine

## 2018-04-21 VITALS — BP 140/80 | HR 59 | Temp 97.6°F | Ht 66.0 in | Wt 162.2 lb

## 2018-04-21 DIAGNOSIS — M545 Low back pain, unspecified: Secondary | ICD-10-CM

## 2018-04-21 DIAGNOSIS — E785 Hyperlipidemia, unspecified: Secondary | ICD-10-CM | POA: Diagnosis not present

## 2018-04-21 DIAGNOSIS — M19049 Primary osteoarthritis, unspecified hand: Secondary | ICD-10-CM | POA: Diagnosis not present

## 2018-04-21 DIAGNOSIS — Z23 Encounter for immunization: Secondary | ICD-10-CM | POA: Diagnosis not present

## 2018-04-21 DIAGNOSIS — Z7189 Other specified counseling: Secondary | ICD-10-CM

## 2018-04-21 DIAGNOSIS — Z0000 Encounter for general adult medical examination without abnormal findings: Secondary | ICD-10-CM

## 2018-04-21 DIAGNOSIS — K219 Gastro-esophageal reflux disease without esophagitis: Secondary | ICD-10-CM | POA: Diagnosis not present

## 2018-04-21 MED ORDER — SIMVASTATIN 10 MG PO TABS: 10.0000 mg | ORAL_TABLET | Freq: Every day | ORAL | 3 refills | Status: DC

## 2018-04-21 MED ORDER — OMEPRAZOLE 40 MG PO CPDR: 40.0000 mg | DELAYED_RELEASE_CAPSULE | Freq: Two times a day (BID) | ORAL | 3 refills | Status: DC

## 2018-04-21 NOTE — Patient Instructions (Signed)
Let me know if you want to go to the GI clinic or the hand clinic.  We can help you get an appointment if needed.  Don't change your meds for now.  Thanks for getting a flu shot.  The tetanus shot may be cheaper at the pharmacy.  Take care.  Glad to see you.

## 2018-04-21 NOTE — Progress Notes (Signed)
I have personally reviewed the Medicare Annual Wellness questionnaire and have noted 1. The patient's medical and social history 2. Their use of alcohol, tobacco or illicit drugs 3. Their current medications and supplements 4. The patient's functional ability including ADL's, fall risks, home safety risks and hearing or visual             impairment. 5. Diet and physical activities 6. Evidence for depression or mood disorders  The patients weight, height, BMI have been recorded in the chart and visual acuity is per eye clinic.  I have made referrals, counseling and provided education to the patient based review of the above and I have provided the pt with a written personalized care plan for preventive services.  Provider list updated- see scanned forms.  Routine anticipatory guidance given to patient.  See health maintenance. The possibility exists that previously documented standard health maintenance information may have been brought forward from a previous encounter into this note.  If needed, that same information has been updated to reflect the current situation based on today's encounter.    Flu today Shingles d/w pt. Out of stock.  PNA UTD Tetanus 2006- it may be cheaper at the pharmacy.  D/w pt.  Colonoscopy 2014 PSA wnl, d/w pt.  Advance directive wife designated if patient were incapacitated.  Cognitive function addressed- see scanned forms- and if abnormal then additional documentation follows.  HCV neg prev.    He has some episodic hemorrhoid irritation.  He wanted to defer tx for now.    He is off gabapentin and tramadol.  He has some back pain after working, not during.  He is putting up with pain and can continue as is.   GERD.  Longstanding.  Still on PPI. Still dealing with sx on frequent basis.  He has seen GI mult times.  He has tried to make diet changes.  He has sig FH of GERD.  We talked about options.  I don't know of good alternative tx at this point, other than  having him d/w GI.  He'll consider.    Elevated Cholesterol: Using medications without problems: yes Muscle aches: not from statin Diet compliance: yes Exercise:yes Labs d/w pt.   He is still putting up with IP joint pain on the hands.  D/w pt.  We talked about options.  I don't know of good alternative tx at this point, other than having him d/w ortho.  He'll consider.    PMH and SH reviewed  Meds, vitals, and allergies reviewed.   ROS: Per HPI.  Unless specifically indicated otherwise in HPI, the patient denies:  General: fever. Eyes: acute vision changes ENT: sore throat Cardiovascular: chest pain Respiratory: SOB GI: vomiting GU: dysuria Musculoskeletal: acute back pain Derm: acute rash Neuro: acute motor dysfunction Psych: worsening mood Endocrine: polydipsia Heme: bleeding Allergy: hayfever  GEN: nad, alert and oriented HEENT: mucous membranes moist NECK: supple w/o LA CV: rrr. PULM: ctab, no inc wob ABD: soft, +bs EXT: no edema SKIN: no acute rash Chronic IP joint changes noted in the hands.

## 2018-04-24 NOTE — Assessment & Plan Note (Signed)
Lipids are reasonable, labs d/w pt.  Continue as is.  He agrees.

## 2018-04-24 NOTE — Assessment & Plan Note (Signed)
Advance directive- wife designated if patient were incapacitated.  

## 2018-04-24 NOTE — Assessment & Plan Note (Signed)
Routine cautions d/w pt.  He is putting up with sx as is.  He'll update me as needed.

## 2018-04-24 NOTE — Assessment & Plan Note (Signed)
Flu today Shingles d/w pt. Out of stock.  PNA UTD Tetanus 2006- it may be cheaper at the pharmacy.  D/w pt.  Colonoscopy 2014 PSA wnl, d/w pt.  Advance directive wife designated if patient were incapacitated.  Cognitive function addressed- see scanned forms- and if abnormal then additional documentation follows.  HCV neg prev.

## 2018-04-24 NOTE — Assessment & Plan Note (Signed)
We talked about options.  I don't know of good alternative tx at this point, other than having him d/w GI.  He'll consider.  Continue as is for now.  He agrees.

## 2018-04-24 NOTE — Assessment & Plan Note (Signed)
I don't know of good alternative tx at this point, other than having him d/w ortho.  He'll consider.

## 2018-05-12 ENCOUNTER — Ambulatory Visit (INDEPENDENT_AMBULATORY_CARE_PROVIDER_SITE_OTHER): Payer: PPO | Admitting: Family Medicine

## 2018-05-12 ENCOUNTER — Encounter: Payer: Self-pay | Admitting: Family Medicine

## 2018-05-12 VITALS — BP 130/76 | HR 61 | Temp 98.7°F | Ht 66.0 in | Wt 161.5 lb

## 2018-05-12 DIAGNOSIS — K219 Gastro-esophageal reflux disease without esophagitis: Secondary | ICD-10-CM | POA: Diagnosis not present

## 2018-05-12 MED ORDER — FAMOTIDINE 20 MG PO TABS: 20.0000 mg | ORAL_TABLET | Freq: Every day | ORAL | Status: DC

## 2018-05-12 NOTE — Patient Instructions (Addendum)
Try adding pepcid at night.  Add that onto omeprazole.  We will call about your referral.  Rosaria Ferries or Azalee Course will call you if you don't see one of them on the way out.  Take care.  Glad to see you.

## 2018-05-12 NOTE — Progress Notes (Signed)
Sig h/o GERD in the past.  Still with some baseline nighttime sx with walking with foul taste in his mouth.  Sleeping with head elevated didn't didn't help.  Sig FH GERD.    Now with a different complaint, with a burning from the lower abdomen up to the chest, episodically.  That seems to be better with eating, meaning when he eats some food that pain will get better.  He has made diet modification with bland foods w/o relief of sx.  No blood in stool.    H/o IBS-C.  H/o cholecystectomy noted.    PMH and SH reviewed  ROS: Per HPI unless specifically indicated in ROS section   Meds, vitals, and allergies reviewed.   GEN: nad, alert and oriented HEENT: mucous membranes moist NECK: supple w/o LA CV: rrr. PULM: ctab, no inc wob ABD: soft, +bs EXT: no edema SKIN: no acute rash

## 2018-05-14 ENCOUNTER — Encounter: Payer: Self-pay | Admitting: Family Medicine

## 2018-05-14 NOTE — Assessment & Plan Note (Addendum)
He has had significant and extensive work-up done previously.  Discussed.  He does have a history of constipation and he has a history of cholecystectomy noted. Discussed options. Reasonable to refer to tertiary care center. In the meantime reasonable to try adding pepcid at night.  He'll add that onto omeprazole.  I do not suspect that he has an ominous diagnosis like Crohn's disease.  We discussed.  I would not suspect that he had a bile salt issue though he does have a history of cholecystectomy noted.  I need GI input.  He agrees with referral.  I appreciate the help of all involved.  >25 minutes spent in face to face time with patient, >50% spent in counselling or coordination of care.

## 2018-05-15 ENCOUNTER — Telehealth: Payer: Self-pay | Admitting: *Deleted

## 2018-05-15 NOTE — Telephone Encounter (Signed)
Small bowel over growth sx are possible but I would defer this to the specialty clinic.  I would prefer their input before we put him through anything else.  Thanks.

## 2018-05-15 NOTE — Telephone Encounter (Signed)
Patient advised.

## 2018-05-15 NOTE — Telephone Encounter (Signed)
Patient says he saw an article in a magazine over the weekend that described his symptoms to a "tee" and it recommended a test kit called a SIBO (small intestinal bowel organisms) Test Kit and he would like your thoughts on this and possibly a prescription for one.  Patient states you are setting him up to see someone else but wonders if this could be a possibility.

## 2018-05-16 ENCOUNTER — Telehealth: Payer: Self-pay | Admitting: Family Medicine

## 2018-05-16 NOTE — Telephone Encounter (Signed)
Left message for patient to call me back about the Referral, [phone]

## 2018-05-18 DIAGNOSIS — Z8371 Family history of colonic polyps: Secondary | ICD-10-CM | POA: Diagnosis not present

## 2018-05-18 DIAGNOSIS — K219 Gastro-esophageal reflux disease without esophagitis: Secondary | ICD-10-CM | POA: Diagnosis not present

## 2018-06-01 DIAGNOSIS — R140 Abdominal distension (gaseous): Secondary | ICD-10-CM | POA: Diagnosis not present

## 2018-06-14 ENCOUNTER — Ambulatory Visit (INDEPENDENT_AMBULATORY_CARE_PROVIDER_SITE_OTHER): Payer: PPO | Admitting: Family Medicine

## 2018-06-14 ENCOUNTER — Encounter: Payer: Self-pay | Admitting: Family Medicine

## 2018-06-14 DIAGNOSIS — K629 Disease of anus and rectum, unspecified: Secondary | ICD-10-CM | POA: Diagnosis not present

## 2018-06-14 NOTE — Patient Instructions (Signed)
Keep the appointment with [facility].  Ask to get on the cancellation list.   I wouldn't change your meds for now.   Take care.  Glad to see you.

## 2018-06-14 NOTE — Progress Notes (Signed)
He was seen at [facility].  Negative hydrogen breath test for bacterial overgrowth.he is going to f/u with [facility].  Discussed with patient.  About 2 weeks ago he had some numbness near the rectum during a BM, lasted about 1 minute.  It self resolved.Yesterday he went to the bathroom.  He had some small stools and he noted more numbness with BM, lasted about 4 minutes, along the L side of the rectum.    Normal sensation now.  No FCNAVD.    He has described persistent burning from his rectum up into his chest.  This is been going on chronically.  That led to the evaluation at [facility].  He has upper and lower endoscopy pending.  He was asking if he can get moved up on the schedule.  I asked him to check with [facility] and see about the cancellation list.  Meds, vitals, and allergies reviewed.   ROS: Per HPI unless specifically indicated in ROS section   nad ncat rrr ctab abd soft, normal BS External rectal exam without gross blood, fissure, external hemorrhoids. Extremities well perfused.

## 2018-06-15 DIAGNOSIS — K629 Disease of anus and rectum, unspecified: Secondary | ICD-10-CM | POA: Insufficient documentation

## 2018-06-15 NOTE — Assessment & Plan Note (Signed)
No visible abnormality seen but he did have atypical symptoms with intermittent numbness at the rectum during the bowel movement.  It could be that he irritated a peripheral nerve.  Discussed with patient.  Unremarkable exam today.  I think is reasonable to follow through with endoscopy at [facility].  I do not see any evidence of any emergent symptoms at this point.  Discussed.  I will await [facility] notes.  I appreciate the help of all involved.  He agrees with plan

## 2018-07-20 DIAGNOSIS — K649 Unspecified hemorrhoids: Secondary | ICD-10-CM | POA: Diagnosis not present

## 2018-07-20 DIAGNOSIS — K219 Gastro-esophageal reflux disease without esophagitis: Secondary | ICD-10-CM | POA: Diagnosis not present

## 2018-07-20 DIAGNOSIS — G4733 Obstructive sleep apnea (adult) (pediatric): Secondary | ICD-10-CM | POA: Diagnosis not present

## 2018-07-20 DIAGNOSIS — Z8371 Family history of colonic polyps: Secondary | ICD-10-CM | POA: Diagnosis not present

## 2018-07-20 DIAGNOSIS — K5909 Other constipation: Secondary | ICD-10-CM | POA: Diagnosis not present

## 2018-07-20 DIAGNOSIS — L290 Pruritus ani: Secondary | ICD-10-CM | POA: Diagnosis not present

## 2018-07-25 ENCOUNTER — Encounter: Payer: Self-pay | Admitting: *Deleted

## 2018-07-26 ENCOUNTER — Ambulatory Visit
Admission: RE | Admit: 2018-07-26 | Discharge: 2018-07-26 | Disposition: A | Discharge status: Home / Self Care | Payer: PPO | Attending: Internal Medicine | Admitting: Internal Medicine

## 2018-07-26 ENCOUNTER — Ambulatory Visit: Payer: PPO | Admitting: Anesthesiology

## 2018-07-26 ENCOUNTER — Encounter
Admission: RE | Disposition: A | Discharge status: Home / Self Care | Payer: Self-pay | Source: Home / Self Care | Attending: Internal Medicine

## 2018-07-26 DIAGNOSIS — K219 Gastro-esophageal reflux disease without esophagitis: Secondary | ICD-10-CM | POA: Diagnosis not present

## 2018-07-26 DIAGNOSIS — Z1211 Encounter for screening for malignant neoplasm of colon: Secondary | ICD-10-CM | POA: Diagnosis not present

## 2018-07-26 DIAGNOSIS — K228 Other specified diseases of esophagus: Secondary | ICD-10-CM | POA: Insufficient documentation

## 2018-07-26 DIAGNOSIS — I10 Essential (primary) hypertension: Secondary | ICD-10-CM | POA: Diagnosis not present

## 2018-07-26 DIAGNOSIS — Z79899 Other long term (current) drug therapy: Secondary | ICD-10-CM | POA: Insufficient documentation

## 2018-07-26 DIAGNOSIS — G4730 Sleep apnea, unspecified: Secondary | ICD-10-CM | POA: Diagnosis not present

## 2018-07-26 DIAGNOSIS — Z8371 Family history of colonic polyps: Secondary | ICD-10-CM | POA: Insufficient documentation

## 2018-07-26 DIAGNOSIS — D124 Benign neoplasm of descending colon: Secondary | ICD-10-CM | POA: Insufficient documentation

## 2018-07-26 DIAGNOSIS — E785 Hyperlipidemia, unspecified: Secondary | ICD-10-CM | POA: Insufficient documentation

## 2018-07-26 DIAGNOSIS — Z87891 Personal history of nicotine dependence: Secondary | ICD-10-CM | POA: Diagnosis not present

## 2018-07-26 DIAGNOSIS — K635 Polyp of colon: Secondary | ICD-10-CM | POA: Diagnosis not present

## 2018-07-26 DIAGNOSIS — Z7982 Long term (current) use of aspirin: Secondary | ICD-10-CM | POA: Diagnosis not present

## 2018-07-26 DIAGNOSIS — K449 Diaphragmatic hernia without obstruction or gangrene: Secondary | ICD-10-CM | POA: Diagnosis not present

## 2018-07-26 HISTORY — PX: ESOPHAGOGASTRODUODENOSCOPY (EGD) WITH PROPOFOL: SHX5813

## 2018-07-26 HISTORY — DX: Cardiac murmur, unspecified: R01.1

## 2018-07-26 HISTORY — PX: COLONOSCOPY WITH PROPOFOL: SHX5780

## 2018-07-26 LAB — HM COLONOSCOPY

## 2018-07-26 SURGERY — COLONOSCOPY WITH PROPOFOL
Anesthesia: General

## 2018-07-26 MED ORDER — SODIUM CHLORIDE 0.9 % IV SOLN
INTRAVENOUS | Status: DC
  Administered 2018-07-26 (×2): via INTRAVENOUS

## 2018-07-26 MED ORDER — PROPOFOL 500 MG/50ML IV EMUL
INTRAVENOUS | Status: DC | PRN
  Administered 2018-07-26: 100 ug/kg/min via INTRAVENOUS

## 2018-07-26 NOTE — Anesthesia Preprocedure Evaluation (Signed)
Anesthesia Evaluation  Patient identified by MRN, date of birth, ID band Patient awake    Reviewed: Allergy & Precautions, H&P , NPO status , Patient's Chart, lab work & pertinent test results  History of Anesthesia Complications Negative for: history of anesthetic complications  Airway Mallampati: III  TM Distance: <3 FB Neck ROM: limited    Dental  (+) Chipped   Pulmonary neg shortness of breath, sleep apnea , former smoker,           Cardiovascular Exercise Tolerance: Good hypertension, (-) angina(-) Past MI and (-) DOE      Neuro/Psych  Neuromuscular disease negative psych ROS   GI/Hepatic Neg liver ROS, GERD  Medicated and Controlled,  Endo/Other  negative endocrine ROS  Renal/GU negative Renal ROS  negative genitourinary   Musculoskeletal  (+) Arthritis ,   Abdominal   Peds  Hematology negative hematology ROS (+)   Anesthesia Other Findings Past Medical History: No date: Allergic rhinitis No date: Bell's palsy No date: GERD (gastroesophageal reflux disease)     Comment:  neg path on EGD 06/14/13 No date: Heart murmur No date: Hemorrhoids No date: History of migraines 07/2002: Hx of adenomatous colonic polyps No date: Hyperlipidemia No date: Hypertension No date: IBS (irritable bowel syndrome) No date: Osteoarthritis  Past Surgical History: No date: CHOLECYSTECTOMY No date: colonoscopy and endoscopy  BMI    Body Mass Index:  25.82 kg/m      Reproductive/Obstetrics negative OB ROS                             Anesthesia Physical Anesthesia Plan  ASA: III  Anesthesia Plan: General   Post-op Pain Management:    Induction: Intravenous  PONV Risk Score and Plan: Propofol infusion and TIVA  Airway Management Planned: Natural Airway and Nasal Cannula  Additional Equipment:   Intra-op Plan:   Post-operative Plan:   Informed Consent: I have reviewed the patients  History and Physical, chart, labs and discussed the procedure including the risks, benefits and alternatives for the proposed anesthesia with the patient or authorized representative who has indicated his/her understanding and acceptance.     Dental Advisory Given  Plan Discussed with: Anesthesiologist, CRNA and Surgeon  Anesthesia Plan Comments: (Patient consented for risks of anesthesia including but not limited to:  - adverse reactions to medications - risk of intubation if required - damage to teeth, lips or other oral mucosa - sore throat or hoarseness - Damage to heart, brain, lungs or loss of life  Patient voiced understanding.)        Anesthesia Quick Evaluation

## 2018-07-26 NOTE — Op Note (Signed)
[facility] Gastroenterology Patient Name: Kyle Sanchez Procedure Date: 07/26/2018 9:56 AM MRN: [id number] Account #: [id number] Date of Birth: [birth year] Admit Type: Outpatient Age: 73 Room: [facility] ENDO ROOM 2 Gender: Male Note Status: Finalized Procedure:            Colonoscopy Indications:          Colon cancer screening in patient at increased risk:                        Family history of 1st-degree relative with colon polyps Providers:            Benay Pike. Alice Reichert MD, MD Referring MD:         Elveria Rising. Damita Dunnings, MD (Referring MD) Medicines:            Propofol per Anesthesia Complications:        No immediate complications. Procedure:            Pre-Anesthesia Assessment:                       - The risks and benefits of the procedure and the                        sedation options and risks were discussed with the                        patient. All questions were answered and informed                        consent was obtained.                       - Patient identification and proposed procedure were                        verified prior to the procedure by the nurse. The                        procedure was verified in the procedure room.                       - ASA Grade Assessment: III - A patient with severe                        systemic disease.                       - After reviewing the risks and benefits, the patient                        was deemed in satisfactory condition to undergo the                        procedure.                       After obtaining informed consent, the colonoscope was                        passed under direct vision. Throughout the procedure,  the patient's blood pressure, pulse, and oxygen                        saturations were monitored continuously. The                        Colonoscope was introduced through the anus and                        advanced to the the cecum, identified by  appendiceal                        orifice and ileocecal valve. The colonoscopy was                        performed without difficulty. The patient tolerated the                        procedure well. The quality of the bowel preparation                        was good. The ileocecal valve, appendiceal orifice, and                        rectum were photographed. Findings:      The perianal and digital rectal examinations were normal. Pertinent       negatives include normal sphincter tone and no palpable rectal lesions.      A 5 mm polyp was found in the descending colon. The polyp was sessile.       The polyp was removed with a jumbo cold forceps. Resection and retrieval       were complete.      The exam was otherwise without abnormality on direct and retroflexion       views. Impression:           - One 5 mm polyp in the descending colon, removed with                        a jumbo cold forceps. Resected and retrieved.                       - The examination was otherwise normal on direct and                        retroflexion views. Recommendation:       - Patient has a contact number available for                        emergencies. The signs and symptoms of potential                        delayed complications were discussed with the patient.                        Return to normal activities tomorrow. Written discharge                        instructions were provided to the patient.                       -  Await pathology results from EGD, also performed                        today. Procedure Code(s):    --- Professional ---                       [phone], Colonoscopy, flexible; with biopsy, single or                        multiple Diagnosis Code(s):    --- Professional ---                       D12.4, Benign neoplasm of descending colon                       Z83.71, Family history of colonic polyps CPT copyright 2018 American Medical Association. All rights reserved. The  codes documented in this report are preliminary and upon coder review may  be revised to meet current compliance requirements. Efrain Sella MD, MD 07/26/2018 10:29:54 AM This report has been signed electronically. Number of Addenda: 0 Note Initiated On: 07/26/2018 9:56 AM Scope Withdrawal Time: 0 hours 7 minutes 21 seconds  Total Procedure Duration: 0 hours 10 minutes 16 seconds       [facility]

## 2018-07-26 NOTE — Transfer of Care (Signed)
Immediate Anesthesia Transfer of Care Note  Patient: Kyle Sanchez  Procedure(s) Performed: COLONOSCOPY WITH PROPOFOL (N/A ) ESOPHAGOGASTRODUODENOSCOPY (EGD) WITH PROPOFOL (N/A )  Patient Location: PACU  Anesthesia Type:General  Level of Consciousness: awake and alert   Airway & Oxygen Therapy: Patient Spontanous Breathing  Post-op Assessment: Report given to RN  Post vital signs: Reviewed and stable  Last Vitals:  Vitals Value Taken Time  BP 112/49 07/26/2018 10:32 AM  Temp 36.1 C 07/26/2018 10:30 AM  Pulse 61 07/26/2018 10:33 AM  Resp 20 07/26/2018 10:33 AM  SpO2 100 % 07/26/2018 10:33 AM  Vitals shown include unvalidated device data.  Last Pain:  Vitals:   07/26/18 1030  TempSrc: Tympanic  PainSc:          Complications: No apparent anesthesia complications

## 2018-07-26 NOTE — Anesthesia Postprocedure Evaluation (Signed)
Anesthesia Post Note  Patient: Kyle Sanchez  Procedure(s) Performed: COLONOSCOPY WITH PROPOFOL (N/A ) ESOPHAGOGASTRODUODENOSCOPY (EGD) WITH PROPOFOL (N/A )  Patient location during evaluation: Endoscopy Anesthesia Type: General Level of consciousness: awake and alert Pain management: pain level controlled Vital Signs Assessment: post-procedure vital signs reviewed and stable Respiratory status: spontaneous breathing, nonlabored ventilation, respiratory function stable and patient connected to nasal cannula oxygen Cardiovascular status: blood pressure returned to baseline and stable Postop Assessment: no apparent nausea or vomiting Anesthetic complications: no     Last Vitals:  Vitals:   07/26/18 1050 07/26/18 1100  BP: (!) 151/80 (!) 152/86  Pulse: (!) 55 (!) 51  Resp: (!) 23 16  Temp:    SpO2: 100% 100%    Last Pain:  Vitals:   07/26/18 1030  TempSrc: Tympanic  PainSc:                  Precious Haws Nastassia Bazaldua

## 2018-07-26 NOTE — Op Note (Signed)
[facility] Gastroenterology Patient Name: Kyle Sanchez Procedure Date: 07/26/2018 9:57 AM MRN: [id number] Account #: [id number] Date of Birth: 1945-11-01 Admit Type: Outpatient Age: 73 Room: [facility] ENDO ROOM 2 Gender: Male Note Status: Finalized Procedure:            Upper GI endoscopy Indications:          Esophageal reflux symptoms that persist despite                        appropriate therapy Providers:            Benay Pike. Alice Reichert MD, MD Referring MD:         Elveria Rising. Damita Dunnings, MD (Referring MD) Medicines:            Propofol per Anesthesia Complications:        No immediate complications. Procedure:            Pre-Anesthesia Assessment:                       - The risks and benefits of the procedure and the                        sedation options and risks were discussed with the                        patient. All questions were answered and informed                        consent was obtained.                       - Patient identification and proposed procedure were                        verified prior to the procedure by the nurse. The                        procedure was verified in the procedure room.                       - ASA Grade Assessment: III - A patient with severe                        systemic disease.                       - After reviewing the risks and benefits, the patient                        was deemed in satisfactory condition to undergo the                        procedure.                       After obtaining informed consent, the endoscope was                        passed under direct vision. Throughout the procedure,  the patient's blood pressure, pulse, and oxygen                        saturations were monitored continuously. The Endoscope                        was introduced through the mouth, and advanced to the                        third part of duodenum. The upper GI endoscopy was            accomplished without difficulty. The patient tolerated                        the procedure well. Findings:      The examined esophagus was normal. Biopsies were obtained from the       proximal and distal esophagus with cold forceps for histology of       suspected eosinophilic esophagitis.      A 1 cm hiatal hernia was present.      The examined duodenum was normal. Impression:           - Normal esophagus. Biopsied.                       - 1 cm hiatal hernia.                       - Normal examined duodenum. Recommendation:       - Await pathology results.                       - Proceed with colonoscopy Procedure Code(s):    --- Professional ---                       [id number], Esophagogastroduodenoscopy, flexible, transoral;                        with biopsy, single or multiple Diagnosis Code(s):    --- Professional ---                       K21.9, Gastro-esophageal reflux disease without                        esophagitis                       K44.9, Diaphragmatic hernia without obstruction or                        gangrene CPT copyright 2018 American Medical Association. All rights reserved. The codes documented in this report are preliminary and upon coder review may  be revised to meet current compliance requirements. Efrain Sella MD, MD 07/26/2018 10:14:55 AM This report has been signed electronically. Number of Addenda: 0 Note Initiated On: 07/26/2018 9:57 AM      [facility]

## 2018-07-26 NOTE — Anesthesia Post-op Follow-up Note (Signed)
Anesthesia QCDR form completed.        

## 2018-07-26 NOTE — Interval H&P Note (Signed)
History and Physical Interval Note:  07/26/2018 9:58 AM  Kyle Sanchez  has presented today for surgery, with the diagnosis of FM HX G POLYPS IN COLON GERD  The various methods of treatment have been discussed with the patient and family. After consideration of risks, benefits and other options for treatment, the patient has consented to  Procedure(s): COLONOSCOPY WITH PROPOFOL (N/A) ESOPHAGOGASTRODUODENOSCOPY (EGD) WITH PROPOFOL (N/A) as a surgical intervention .  The patient's history has been reviewed, patient examined, no change in status, stable for surgery.  I have reviewed the patient's chart and labs.  Questions were answered to the patient's satisfaction.     [place], [place]

## 2018-07-26 NOTE — H&P (Signed)
Outpatient short stay form Pre-procedure 07/26/2018 9:55 AM  Kyle Sanchez, M.D.  Primary Physician: [name], M.D.  Reason for visit:  Treatment refractory GERD, family hx of colon polyps.  History of present illness:  73year old patient presenting for family history of colon polyps. Patient has chronic constipation controlled with stool softeners. No  rectal bleeding or involuntary weight loss.  Patient has recurrent waterbrash and regurgitation despite being on double dose PPI in the past.  Rate patient recently started rabeprazole 20 mg daily to improve symptoms.  This has not done anything as of yet.  No dysphagia or weight loss.     Current Facility-Administered Medications:  .  0.9 %  sodium chloride infusion, , Intravenous, Continuous, Falcon, Benay Pike, MD, Last Rate: 20 mL/hr at 07/26/18 2836  Medications Prior to Admission  Medication Sig Dispense Refill Last Dose  . aspirin EC 81 MG tablet Take 81 mg by mouth daily.   07/23/2018  . vitamin E 400 UNIT capsule Take 400 Units by mouth daily.   07/23/2018  . Cholecalciferol (D3-1000) 1000 units capsule Take 1,000 Units by mouth daily. Take 2 capsules daily.   07/23/2018  . famotidine (PEPCID) 20 MG tablet Take 1 tablet (20 mg total) by mouth at bedtime.   07/23/2018  . Omega-3 Fatty Acids (RA FISH OIL) 1400 MG CPDR Take by mouth daily.   07/23/2018  . omeprazole (PRILOSEC) 40 MG capsule Take 1 capsule (40 mg total) by mouth 2 (two) times daily. (Patient not taking: Reported on 06/14/2018) 180 capsule 3 Not Taking  . RABEprazole (ACIPHEX) 20 MG tablet Take 20 mg by mouth daily.   07/23/2018  . simvastatin (ZOCOR) 10 MG tablet Take 1 tablet (10 mg total) by mouth at bedtime. (Patient not taking: Reported on 05/12/2018) 90 tablet 3 Not Taking  . SUPER B COMPLEX/C PO Take by mouth daily.   07/23/2018  . Ubiquinol (QUNOL COQ10/UBIQUINOL/MEGA) 100 MG CAPS Take 100 mg by mouth daily.   07/23/2018     Allergies  Allergen Reactions  .  Sucralfate     Dry mouth, aches     Past Medical History:  Diagnosis Date  . Allergic rhinitis   . Bell's palsy   . GERD (gastroesophageal reflux disease)    neg path on EGD 06/14/13  . Heart murmur   . Hemorrhoids   . History of migraines   . Hx of adenomatous colonic polyps 07/2002  . Hyperlipidemia   . Hypertension   . IBS (irritable bowel syndrome)   . Osteoarthritis     Review of systems:  Otherwise negative.    Physical Exam  Gen: Alert, oriented. Appears stated age.  HEENT: /AT. PERRLA. Lungs: CTA, no wheezes. CV: RR nl S1, S2. Abd: soft, benign, no masses. BS+ Ext: No edema. Pulses 2+    Planned procedures: Proceed with EGD and  colonoscopy. The patient understands the nature of the planned procedure, indications, risks, alternatives and potential complications including but not limited to bleeding, infection, perforation, damage to internal organs and possible oversedation/side effects from anesthesia. The patient agrees and gives consent to proceed.  Please refer to procedure notes for findings, recommendations and patient disposition/instructions.      Kyle Sanchez, M.D. Gastroenterology 07/26/2018  9:55 AM

## 2018-07-27 LAB — SURGICAL PATHOLOGY

## 2018-08-10 ENCOUNTER — Encounter: Payer: Self-pay | Admitting: Family Medicine

## 2018-08-24 DIAGNOSIS — Z8371 Family history of colonic polyps: Secondary | ICD-10-CM | POA: Diagnosis not present

## 2018-08-24 DIAGNOSIS — K319 Disease of stomach and duodenum, unspecified: Secondary | ICD-10-CM | POA: Diagnosis not present

## 2018-08-24 DIAGNOSIS — Z86010 Personal history of colonic polyps: Secondary | ICD-10-CM | POA: Diagnosis not present

## 2018-08-24 DIAGNOSIS — R1013 Epigastric pain: Secondary | ICD-10-CM | POA: Diagnosis not present

## 2018-08-24 DIAGNOSIS — K219 Gastro-esophageal reflux disease without esophagitis: Secondary | ICD-10-CM | POA: Diagnosis not present

## 2018-08-24 DIAGNOSIS — D369 Benign neoplasm, unspecified site: Secondary | ICD-10-CM | POA: Diagnosis not present

## 2018-10-05 DIAGNOSIS — K319 Disease of stomach and duodenum, unspecified: Secondary | ICD-10-CM | POA: Diagnosis not present

## 2018-10-05 DIAGNOSIS — R1013 Epigastric pain: Secondary | ICD-10-CM | POA: Diagnosis not present

## 2019-02-12 ENCOUNTER — Other Ambulatory Visit: Payer: Self-pay

## 2019-03-21 ENCOUNTER — Ambulatory Visit (INDEPENDENT_AMBULATORY_CARE_PROVIDER_SITE_OTHER): Payer: PPO

## 2019-03-21 DIAGNOSIS — Z23 Encounter for immunization: Secondary | ICD-10-CM | POA: Diagnosis not present

## 2019-04-19 ENCOUNTER — Other Ambulatory Visit (INDEPENDENT_AMBULATORY_CARE_PROVIDER_SITE_OTHER): Payer: PPO

## 2019-04-19 ENCOUNTER — Other Ambulatory Visit: Payer: Self-pay | Admitting: Family Medicine

## 2019-04-19 DIAGNOSIS — Z125 Encounter for screening for malignant neoplasm of prostate: Secondary | ICD-10-CM | POA: Diagnosis not present

## 2019-04-19 DIAGNOSIS — E785 Hyperlipidemia, unspecified: Secondary | ICD-10-CM

## 2019-04-19 LAB — LIPID PANEL
Cholesterol: 171 mg/dL (ref 0–200)
HDL: 49.6 mg/dL (ref >39.00)
LDL Cholesterol: 105 mg/dL — ABNORMAL HIGH (ref 0–99)
NonHDL: 121.14
Total CHOL/HDL Ratio: 3
Triglycerides: 82 mg/dL (ref 0.0–149.0)
VLDL: 16.4 mg/dL (ref 0.0–40.0)

## 2019-04-19 LAB — COMPREHENSIVE METABOLIC PANEL
ALT: 21 U/L (ref 0–53)
AST: 22 U/L (ref 0–37)
Albumin: 4.4 g/dL (ref 3.5–5.2)
Alkaline Phosphatase: 70 U/L (ref 39–117)
BUN: 16 mg/dL (ref 6–23)
CO2: 31 mEq/L (ref 19–32)
Calcium: 9.9 mg/dL (ref 8.4–10.5)
Chloride: 103 mEq/L (ref 96–112)
Creatinine, Ser: 0.91 mg/dL (ref 0.40–1.50)
GFR: 81.32 mL/min (ref >60.00)
Glucose, Bld: 93 mg/dL (ref 70–99)
Potassium: 4.1 mEq/L (ref 3.5–5.1)
Sodium: 142 mEq/L (ref 135–145)
Total Bilirubin: 0.7 mg/dL (ref 0.2–1.2)
Total Protein: 7 g/dL (ref 6.0–8.3)

## 2019-04-19 LAB — PSA, MEDICARE: PSA: 1.97 ng/ml (ref 0.10–4.00)

## 2019-04-23 ENCOUNTER — Other Ambulatory Visit: Payer: Self-pay

## 2019-04-23 ENCOUNTER — Ambulatory Visit (INDEPENDENT_AMBULATORY_CARE_PROVIDER_SITE_OTHER): Payer: PPO | Admitting: Family Medicine

## 2019-04-23 ENCOUNTER — Encounter: Payer: Self-pay | Admitting: Family Medicine

## 2019-04-23 VITALS — BP 122/68 | HR 68 | Temp 97.6°F | Ht 67.25 in | Wt 161.9 lb

## 2019-04-23 DIAGNOSIS — Z7189 Other specified counseling: Secondary | ICD-10-CM

## 2019-04-23 DIAGNOSIS — K219 Gastro-esophageal reflux disease without esophagitis: Secondary | ICD-10-CM

## 2019-04-23 DIAGNOSIS — Z0000 Encounter for general adult medical examination without abnormal findings: Secondary | ICD-10-CM | POA: Diagnosis not present

## 2019-04-23 DIAGNOSIS — E785 Hyperlipidemia, unspecified: Secondary | ICD-10-CM | POA: Diagnosis not present

## 2019-04-23 MED ORDER — OMEPRAZOLE 40 MG PO CPDR: 40.0000 mg | DELAYED_RELEASE_CAPSULE | Freq: Every day | ORAL | Status: DC

## 2019-04-23 MED ORDER — OMEPRAZOLE 40 MG PO CPDR: 40.0000 mg | DELAYED_RELEASE_CAPSULE | Freq: Two times a day (BID) | ORAL | Status: DC | PRN

## 2019-04-23 MED ORDER — SIMVASTATIN 10 MG PO TABS: 10.0000 mg | ORAL_TABLET | Freq: Every day | ORAL | 3 refills | Status: DC

## 2019-04-23 NOTE — Progress Notes (Addendum)
I have personally reviewed the Medicare Annual Wellness questionnaire and have noted 1. The patient's medical and social history 2. Their use of alcohol, tobacco or illicit drugs 3. Their current medications and supplements 4. The patient's functional ability including ADL's, fall risks, home safety risks and hearing or visual             impairment. 5. Diet and physical activities 6. Evidence for depression or mood disorders  The patients weight, height, BMI have been recorded in the chart and visual acuity is per eye clinic.  I have made referrals, counseling and provided education to the patient based review of the above and I have provided the pt with a written personalized care plan for preventive services.  Provider list updated- see scanned forms.  Routine anticipatory guidance given to patient.  See health maintenance. The possibility exists that previously documented standard health maintenance information may have been brought forward from a previous encounter into this note.  If needed, that same information has been updated to reflect the current situation based on today's encounter.    Flu 2020 Shingles discussed with patient PNA up-to-date Tetanus discussed with patient Colon cancer screening 2020 Prostate cancer screening 2020 Advance directive-wife designated if patient were incapacitated Cognitive function addressed- see scanned forms- and if abnormal then additional documentation follows.   GERD.  On PPI daily.  Compliant.  Still symptomatic. EGD with - Normal esophagus. Biopsied. - 1 cm hiatal hernia. - Normal examined duodenum.  Colonoscopy - One 5 mm polyp in the descending colon, removed with a jumbo cold forceps. Resected and retrieved. - The examination was otherwise normal on direct and retroflexion views.  Elevated Cholesterol: Using medications without problems:yes Muscle aches:  Some aches, he attributed to OA but not his statin use, d/w pt.  Diet  compliance: yes Exercise:yes Labs d/w pt.   PMH and SH reviewed Meds, vitals, and allergies reviewed.   ROS: Per HPI.  Unless specifically indicated otherwise in HPI, the patient denies:  General: fever. Eyes: acute vision changes ENT: sore throat Cardiovascular: chest pain Respiratory: SOB GI: vomiting GU: dysuria Musculoskeletal: acute back pain Derm: acute rash Neuro: acute motor dysfunction Psych: worsening mood Endocrine: polydipsia Heme: bleeding Allergy: hayfever  GEN: nad, alert and oriented HEENT: ncat NECK: supple w/o LA CV: rrr. PULM: ctab, no inc wob ABD: soft, +bs EXT: no edema SKIN: no acute rash  Health Maintenance  Topic Date Due  . TETANUS/TDAP  06/12/2015  . COLONOSCOPY  07/27/2023  . INFLUENZA VACCINE  Completed  . Hepatitis C Screening  Completed  . PNA vac Low Risk Adult  Completed

## 2019-04-23 NOTE — Patient Instructions (Addendum)
Check with your insurance to see if they will cover the shingrix and testanus shots.  Thanks for getting a flu shot.  Let me check with GI.  Take care.  Glad to see you.  Update me as needed.

## 2019-04-25 DIAGNOSIS — H25813 Combined forms of age-related cataract, bilateral: Secondary | ICD-10-CM | POA: Diagnosis not present

## 2019-04-25 DIAGNOSIS — H353131 Nonexudative age-related macular degeneration, bilateral, early dry stage: Secondary | ICD-10-CM | POA: Diagnosis not present

## 2019-04-25 DIAGNOSIS — H01021 Squamous blepharitis right upper eyelid: Secondary | ICD-10-CM | POA: Diagnosis not present

## 2019-04-25 DIAGNOSIS — H01024 Squamous blepharitis left upper eyelid: Secondary | ICD-10-CM | POA: Diagnosis not present

## 2019-04-25 DIAGNOSIS — H40013 Open angle with borderline findings, low risk, bilateral: Secondary | ICD-10-CM | POA: Diagnosis not present

## 2019-04-25 NOTE — Assessment & Plan Note (Signed)
Advance directive- wife designated if patient were incapacitated.  

## 2019-04-25 NOTE — Assessment & Plan Note (Signed)
Longstanding.  I am checking to see if referral would be reasonable to 1 of our GI specialist.  Discussed with patient.  He did know if he would be a candidate for 1 of the advanced endoscopy procedures.

## 2019-04-25 NOTE — Assessment & Plan Note (Signed)
Continue statin.  Continue work on diet and exercise.  He agrees. 

## 2019-04-25 NOTE — Assessment & Plan Note (Signed)
Flu 2020 Shingles discussed with patient PNA up-to-date Tetanus discussed with patient Colon cancer screening 2020 Prostate cancer screening 2020 Advance directive-wife designated if patient were incapacitated Cognitive function addressed- see scanned forms- and if abnormal then additional documentation follows.

## 2019-05-06 ENCOUNTER — Telehealth: Payer: Self-pay | Admitting: Family Medicine

## 2019-05-06 NOTE — Telephone Encounter (Signed)
Please check with patient.  My understanding is that Dr. Bryan Lemma does perform TIF (Transoral incisionless fundoplication) on some patients who have significant GERD.  If he wants to go talk to Dr. Bryan Lemma and needs a referral, then let me know and I will put that in.  Thanks.

## 2019-05-07 NOTE — Telephone Encounter (Signed)
Patient has appointment 05/08/2019 to discuss.

## 2019-05-08 ENCOUNTER — Other Ambulatory Visit: Payer: Self-pay

## 2019-05-08 ENCOUNTER — Encounter: Payer: Self-pay | Admitting: Family Medicine

## 2019-05-08 ENCOUNTER — Ambulatory Visit (INDEPENDENT_AMBULATORY_CARE_PROVIDER_SITE_OTHER): Payer: PPO | Admitting: Family Medicine

## 2019-05-08 VITALS — BP 132/70 | HR 73 | Temp 97.2°F | Ht 67.25 in | Wt 157.4 lb

## 2019-05-08 DIAGNOSIS — K219 Gastro-esophageal reflux disease without esophagitis: Secondary | ICD-10-CM

## 2019-05-08 DIAGNOSIS — R21 Rash and other nonspecific skin eruption: Secondary | ICD-10-CM

## 2019-05-08 DIAGNOSIS — K629 Disease of anus and rectum, unspecified: Secondary | ICD-10-CM | POA: Diagnosis not present

## 2019-05-08 MED ORDER — HYDROCORTISONE ACETATE 25 MG RE SUPP: 25.0000 mg | Freq: Two times a day (BID) | RECTAL | 0 refills | Status: DC

## 2019-05-08 MED ORDER — TRIAMCINOLONE ACETONIDE 0.5 % EX CREA: 1.0000 "application " | TOPICAL_CREAM | Freq: Two times a day (BID) | CUTANEOUS | 1 refills | Status: DC | PRN

## 2019-05-08 NOTE — Patient Instructions (Addendum)
I think it would make sense to talk to Dr. Bryan Lemma and see what he has to offer.  He may talk to you about transoral incisionless fundoplication.   I don't know if you are a good candidate and I need his input.    Use the suppositories as needed and TAC cream on your arm/buttock externally for the itchy spots.  Take care.  Glad to see you.

## 2019-05-08 NOTE — Progress Notes (Signed)
GERD.  He stopped PPI, he cut back on sugar, is down 4 lbs in the meantime.  He tried taking more citrus but cut out milk/dairy.  He was using oat/almond substitute for milk in his coffee.  We talked about GI eval for GERD.   He had rectal burning with BM in the meantime, burning and itching.  He has tried lidocaine cream w/ some relief.     He had some sensation changes locally after recent BM changes/hemorrhoid irritation.  No hemorrhoid bulging noted externally by patient.  No blood in stool.   As a separate issue he has recently noticed an itchy rash.  See exam below.  Meds, vitals, and allergies reviewed.   ROS: Per HPI unless specifically indicated in ROS section   nad ncat rrr ctab abd soft, not ttp, normal BS Skin irritation noted on the B buttocks and L forearm.  Itchy, blanching maculopapular lesions.

## 2019-05-09 NOTE — Assessment & Plan Note (Signed)
I do not know if he would be a candidate for TIF but I would like Dr. Vivia Ewing input.  Discussed with patient.  Refer.

## 2019-05-09 NOTE — Assessment & Plan Note (Signed)
Start triamcinolone cream externally and update me as needed.  This looks like a benign eczema variant or similar.

## 2019-05-09 NOTE — Assessment & Plan Note (Signed)
He did have hemorrhoids on previous colonoscopy but given his symptoms is reasonable to try hydrocortisone suppository and then update me if not better.  He agrees.

## 2019-05-10 ENCOUNTER — Telehealth: Payer: Self-pay | Admitting: Gastroenterology

## 2019-05-10 NOTE — Telephone Encounter (Signed)
Hi Dr. Bryan Lemma, we have received a referral from pt's PCP for evaluation of GERD. Patient is established at [facility] but wants to transfer his care over to you because you have been highly recommended by his PCP. His GI records are in Epic. Could you please review them and advise on scheduling? Thank you.

## 2019-05-11 NOTE — Telephone Encounter (Signed)
I reviewed his chart to include recent evaluation by his PCM along with prior evaluation by [facility].  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  Prior positive pH study with significantly elevated JD score.  Dr. Damita Dunnings (his [facility]) is requesting evaluation for refractory reflux and consideration for possible TIF.  I would be very happy to meet with him at a clinic appointment.  Thank you.

## 2019-05-14 ENCOUNTER — Encounter: Payer: Self-pay | Admitting: Gastroenterology

## 2019-05-14 NOTE — Telephone Encounter (Signed)
Consult sch on 11/19 in [place] office.

## 2019-05-15 DIAGNOSIS — H2512 Age-related nuclear cataract, left eye: Secondary | ICD-10-CM | POA: Diagnosis not present

## 2019-05-15 DIAGNOSIS — H25043 Posterior subcapsular polar age-related cataract, bilateral: Secondary | ICD-10-CM | POA: Diagnosis not present

## 2019-05-15 DIAGNOSIS — H18413 Arcus senilis, bilateral: Secondary | ICD-10-CM | POA: Diagnosis not present

## 2019-05-15 DIAGNOSIS — H2513 Age-related nuclear cataract, bilateral: Secondary | ICD-10-CM | POA: Diagnosis not present

## 2019-05-15 DIAGNOSIS — H25013 Cortical age-related cataract, bilateral: Secondary | ICD-10-CM | POA: Diagnosis not present

## 2019-05-31 ENCOUNTER — Ambulatory Visit: Payer: PPO | Admitting: Gastroenterology

## 2019-05-31 ENCOUNTER — Encounter: Payer: Self-pay | Admitting: Gastroenterology

## 2019-05-31 VITALS — BP 118/72 | HR 72 | Temp 97.8°F | Ht 66.0 in | Wt 161.1 lb

## 2019-05-31 DIAGNOSIS — R1013 Epigastric pain: Secondary | ICD-10-CM | POA: Diagnosis not present

## 2019-05-31 DIAGNOSIS — K449 Diaphragmatic hernia without obstruction or gangrene: Secondary | ICD-10-CM

## 2019-05-31 DIAGNOSIS — K581 Irritable bowel syndrome with constipation: Secondary | ICD-10-CM

## 2019-05-31 DIAGNOSIS — K219 Gastro-esophageal reflux disease without esophagitis: Secondary | ICD-10-CM

## 2019-05-31 DIAGNOSIS — Z86010 Personal history of colonic polyps: Secondary | ICD-10-CM

## 2019-05-31 NOTE — Patient Instructions (Signed)
If you are age 74 or older, your body mass index should be between 23-30. Your Body mass index is 26.01 kg/m. If this is out of the aforementioned range listed, please consider follow up with your Primary Care Provider.  If you are age 70 or younger, your body mass index should be between 19-25. Your Body mass index is 26.01 kg/m. If this is out of the aformentioned range listed, please consider follow up with your Primary Care Provider.   Resume Pamelor as previously prescribed.  We will ger your records from [place].(Bravo and GES results)   You will be due for a recall follow up visit in 6 months. We will send you a reminder in the mail when it gets closer to that time.  It was a pleasure to see you today!

## 2019-05-31 NOTE — Progress Notes (Addendum)
Chief Complaint: GERD, dyspepsia  Referring Provider:     Tonia Ghent, MD   HPI:    Kyle Sanchez is a 73 y.o. male referred to the Gastroenterology Clinic for evaluation of reflux and consideration for Transoral Incisionless Fundoplication (TIF).  He has a longstanding history of reflux symptoms for 50 years, with index symptoms of water brash, regurgitation, and heartburn.  Has followed with multiple GI providers at [facility] and [facility].  Per review of notes, has previously trialed AcipHex, omeprazole, Reglan, Carafate, Dexilant, Protonix. Currently prescribed omeprazole 40 mg BID, but will forget PM dose very frequently. Sxs worse in the early AM. Previously worse throughout day, but daytime sxs largely improved with taking AM PPI reliably. Not reliably a/w certain foods. No dysphagia.  Is concerned about continuing to take PPI due to fear of ADR profile.   Continues to have dyspepsia, which is just as bothersome as typical reflux sxs. Can be soreness, discomfort, and pressure throughout abdomen, but not frank pain. No hematochezia or melena. Maintians healthy diet and active lifestyle with regular exercise. Drinks plenty of water. Was previously prescribed Pamelor, with improvement in sxs, but was not sure that he was supposed to keep taking, so stopped this a couple months ago.  Previously followed with Dr. Alice Reichert at [place], last seen in 09/2018 for apparent functional dyspepsia and reflux symptoms.  At that time, symptoms responded well to nortriptyline 25 mg nightly and continued omeprazole.  Added Pepcid qhs for continued symptoms, but patient did not take for long.  He had previously discussed referral for surgical intervention for reflux with Dr. Candace Cruise at [place] in 2016.  History of open cholecystectomy at age 40.  Separately, he c/o chronic constipation.  Currently well controlled with MiraLAX.   Endoscopic history: -EGD (07/2018, Dr. Alice Reichert,  [place] [facility]): 1 cm HH, Hill grade 2 valve.  Biopsies negative for EOE -Colonoscopy (07/2018, Dr. Alice Reichert, [place] [facility]): 1 tubular adenoma  -Bravo study (2014, Dr. Candace Cruise): Reveals "significant reflux".  Not sure if this was done on or off medications.  No report in EMR for review.  -Hydrogen breath test (05/2018, [facility]): Negative/normal -GES done some years ago per patient- unsure of results and not in EMR  Addendum: Some records received after appointment conclusion as below: -GES (06/2013): Diminished emptying in the initial 3 hours (0% at 1 hour, 23% at 2 hours, 65% at 3 hours) but 97% emptying at 4 hours c/w slowed emptying but not frank gastroparesis. -Bravo 48-hour study (12/2011): Significant reflux with 134 refluxate events, pH <4 10.3% (normal 2.42%).  DeMeester score 46.6 (normal <14.7).   Past Medical History:  Diagnosis Date  . Allergic rhinitis   . Bell's palsy   . GERD (gastroesophageal reflux disease)    neg path on EGD 06/14/13  . Heart murmur   . Hemorrhoids   . Hiatal hernia    1 cm hill grade 2   . History of migraines   . Hx of adenomatous colonic polyps 07/2002  . Hyperlipidemia   . Hypertension   . IBS (irritable bowel syndrome)   . Osteoarthritis      Past Surgical History:  Procedure Laterality Date  . CHOLECYSTECTOMY    . colonoscopy and endoscopy    . COLONOSCOPY WITH PROPOFOL N/A 07/26/2018   Procedure: COLONOSCOPY WITH PROPOFOL;  Surgeon: Toledo, Benay Pike, MD;  Location: [facility] ENDOSCOPY;  Service: Gastroenterology;  Laterality: N/A;  .  ESOPHAGOGASTRODUODENOSCOPY (EGD) WITH PROPOFOL N/A 07/26/2018   Procedure: ESOPHAGOGASTRODUODENOSCOPY (EGD) WITH PROPOFOL;  Surgeon: Toledo, Benay Pike, MD;  Location: [facility] ENDOSCOPY;  Service: Gastroenterology;  Laterality: N/A;   Family History  Problem Relation Age of Onset  . Nephrolithiasis Mother   . Arthritis Mother   . Stroke Mother   . Alcohol abuse Father   . Mental illness Sister   . Prostate cancer  Brother   . Colon cancer Neg Hx    Social History   Tobacco Use  . Smoking status: Former Research scientist (life sciences)  . Smokeless tobacco: Never Used  . Tobacco comment: Stopped 30 years ago   Substance Use Topics  . Alcohol use: Never    Alcohol/week: 0.0 standard drinks    Frequency: Never  . Drug use: No   Current Outpatient Medications  Medication Sig Dispense Refill  . aspirin EC 81 MG tablet Take 81 mg by mouth daily.    . Cholecalciferol (D3-1000) 1000 units capsule Take 1,000 Units by mouth daily. Take 2 capsules daily.    . hydrocortisone (ANUSOL-HC) 25 MG suppository Place 1 suppository (25 mg total) rectally 2 (two) times daily. 12 suppository 0  . Omega-3 Fatty Acids (RA FISH OIL) 1400 MG CPDR Take by mouth daily.    [name] Kitchen omeprazole (PRILOSEC) 40 MG capsule Take 1 capsule (40 mg total) by mouth 2 (two) times daily as needed. (Patient taking differently: Take 40 mg by mouth daily. )    . simvastatin (ZOCOR) 10 MG tablet Take 1 tablet (10 mg total) by mouth at bedtime. 90 tablet 3  . SUPER B COMPLEX/C PO Take by mouth daily.    [name] Kitchen triamcinolone cream (KENALOG) 0.5 % Apply 1 application topically 2 (two) times daily as needed. 30 g 1  . Ubiquinol (QUNOL COQ10/UBIQUINOL/MEGA) 100 MG CAPS Take 100 mg by mouth daily.    . vitamin E 400 UNIT capsule Take 400 Units by mouth daily.     No current facility-administered medications for this visit.    Allergies  Allergen Reactions  . Sucralfate     Dry mouth, aches     Review of Systems: All systems reviewed and negative except where noted in HPI.     Physical Exam:    Wt Readings from Last 3 Encounters:  05/31/19 161 lb 2 oz (73.1 kg)  05/08/19 157 lb 6 oz (71.4 kg)  04/23/19 161 lb 14.4 oz (73.4 kg)    Temp 97.8 F (36.6 C)   Ht 5\' 6"  (1.676 m)   Wt 161 lb 2 oz (73.1 kg)   BMI 26.01 kg/m  Constitutional:  Pleasant, in no acute distress. Psychiatric: Normal mood and affect. Behavior is normal. EENT: Pupils normal.  Conjunctivae are  normal. No scleral icterus. Neck supple. No cervical LAD. Cardiovascular: Normal rate, regular rhythm. No edema Pulmonary/chest: Effort normal and breath sounds normal. No wheezing, rales or rhonchi. Abdominal: Large RUQ well-healed incision.  Soft, nondistended, nontender. Bowel sounds active throughout. There are no masses palpable. No hepatomegaly. Neurological: Alert and oriented to person place and time. Skin: Skin is warm and dry. No rashes noted.   ASSESSMENT AND PLAN;   1) GERD: 2) Hiatal hernia Discussed the pathophysiology of reflux at length today, to include treatment options of continued medical management versus antireflux surgical options, most specifically TIF.  He seems reluctant for any antireflux surgery at this juncture, but would like to think about it some more.  In the meantime, will proceed as below:  -Advised to start  taking the evening dose of omeprazole, 30 to 60 minutes before dinner.  Continue as BID for diagnostic and therapeutic intent -If unable to remember predinner omeprazole, could also consider Pepcid qhs to aid in nocturnal/early a.m. symptoms -Prior GES in 2014 with delayed emptying, but not frank gastroparesis.  If considering antireflux surgery, may need to consider repeating GES -Bravo with elevated DeMeester score in 2013 -If considering TIF, plan for repeat EGD for preoperative assessment.  Did discuss the possibility of repeat Bravo study given time from prior study.  Discussed transnasal pH probe, which he is confident that he would not tolerate  3) Dyspepsia: - Resume Pamelor given prior clinical response -Discussed pathophysiology and benefit of neuromodulation with Pamelor.  Discussed medication ADR profile  4) IBS-C: -Well controlled with MiraLAX -Continue healthy eating habits, adequate water intake, healthy lifestyle/exercise  5) Tubular adenoma: -Single tubular adenoma on colonoscopy earlier this year.  Per guidelines, not due for  repeat until 2027    Lavena Bullion, DO, FACG  05/31/2019, 9:28 AM   Tonia Ghent, MD

## 2019-06-11 DIAGNOSIS — H2512 Age-related nuclear cataract, left eye: Secondary | ICD-10-CM | POA: Diagnosis not present

## 2019-06-11 DIAGNOSIS — Z9842 Cataract extraction status, left eye: Secondary | ICD-10-CM | POA: Diagnosis not present

## 2019-06-11 DIAGNOSIS — Z961 Presence of intraocular lens: Secondary | ICD-10-CM | POA: Diagnosis not present

## 2019-06-11 DIAGNOSIS — H25012 Cortical age-related cataract, left eye: Secondary | ICD-10-CM | POA: Diagnosis not present

## 2019-06-12 DIAGNOSIS — H2511 Age-related nuclear cataract, right eye: Secondary | ICD-10-CM | POA: Diagnosis not present

## 2019-07-09 DIAGNOSIS — H2511 Age-related nuclear cataract, right eye: Secondary | ICD-10-CM | POA: Diagnosis not present

## 2019-07-13 HISTORY — PX: CATARACT EXTRACTION, BILATERAL: SHX1313

## 2019-08-14 ENCOUNTER — Other Ambulatory Visit: Payer: Self-pay

## 2019-08-14 ENCOUNTER — Ambulatory Visit (INDEPENDENT_AMBULATORY_CARE_PROVIDER_SITE_OTHER): Payer: PPO | Admitting: Family Medicine

## 2019-08-14 ENCOUNTER — Encounter: Payer: Self-pay | Admitting: Family Medicine

## 2019-08-14 VITALS — BP 122/72 | HR 62 | Temp 96.3°F | Ht 66.0 in | Wt 164.5 lb

## 2019-08-14 DIAGNOSIS — R109 Unspecified abdominal pain: Secondary | ICD-10-CM | POA: Diagnosis not present

## 2019-08-14 DIAGNOSIS — E785 Hyperlipidemia, unspecified: Secondary | ICD-10-CM

## 2019-08-14 NOTE — Patient Instructions (Addendum)
Go to the lab on the way out.   If you have mychart we'll likely use that to update you.    Don't change your meds for now.  Let me check with Dr. Bryan Lemma.  We'll go from there.  Take care.  Glad to see you.

## 2019-08-14 NOTE — Progress Notes (Signed)
This visit occurred during the SARS-CoV-2 public health emergency.  Safety protocols were in place, including screening questions prior to the visit, additional usage of staff PPE, and extensive cleaning of exam room while observing appropriate contact time as indicated for disinfecting solutions.  covid vaccine d/w pt.  He is on the waitlist.   abd pain d/w pt. Lower abd pain prior to BM.  No blood in stool.  Pain resolved with BM.  No blood in stool. He thought he can feel a lump/hemorrhoid after BM.  He doesn't have hard stools.  He doesn't have urinary sx.    He feels better off statin.  His aches got better off simvastatin.  F/u lipids pending.    Meds, vitals, and allergies reviewed.   ROS: Per HPI unless specifically indicated in ROS section   GEN: nad, alert and oriented HEENT: ncat NECK: supple w/o LA CV: rrr PULM: ctab, no inc wob ABD: soft, +bs, lower abd mildly sore but not ttp o/w.  EXT: no edema SKIN: no acute rash

## 2019-08-15 LAB — LIPID PANEL
Cholesterol: 194 mg/dL (ref 0–200)
HDL: 47.2 mg/dL (ref >39.00)
NonHDL: 146.76
Total CHOL/HDL Ratio: 4
Triglycerides: 322 mg/dL — ABNORMAL HIGH (ref 0.0–149.0)
VLDL: 64.4 mg/dL — ABNORMAL HIGH (ref 0.0–40.0)

## 2019-08-15 LAB — LDL CHOLESTEROL, DIRECT: Direct LDL: 129 mg/dL

## 2019-08-15 NOTE — Assessment & Plan Note (Signed)
Recheck lipids pending.  See notes on labs.

## 2019-08-15 NOTE — Assessment & Plan Note (Addendum)
I will ask for input from Dr. Bryan Lemma.  He has abdominal pain that improves with bowel movement and I do not know if this is related to pelvic floor dysfunction, irritable bowel syndrome, or a structural issue like hemorrhoids.  I need GI input.  Still okay for outpatient follow-up.

## 2019-08-16 ENCOUNTER — Other Ambulatory Visit: Payer: PPO

## 2019-08-16 ENCOUNTER — Other Ambulatory Visit: Payer: Self-pay | Admitting: Family Medicine

## 2019-08-16 DIAGNOSIS — E785 Hyperlipidemia, unspecified: Secondary | ICD-10-CM

## 2019-08-16 NOTE — Progress Notes (Signed)
LMOM for patient to call back.

## 2019-08-17 ENCOUNTER — Other Ambulatory Visit: Payer: Self-pay

## 2019-08-17 ENCOUNTER — Other Ambulatory Visit (INDEPENDENT_AMBULATORY_CARE_PROVIDER_SITE_OTHER): Payer: PPO

## 2019-08-17 DIAGNOSIS — E785 Hyperlipidemia, unspecified: Secondary | ICD-10-CM

## 2019-08-17 LAB — LIPID PANEL
Cholesterol: 194 mg/dL (ref 0–200)
HDL: 51.9 mg/dL (ref >39.00)
LDL Cholesterol: 125 mg/dL — ABNORMAL HIGH (ref 0–99)
NonHDL: 141.9
Total CHOL/HDL Ratio: 4
Triglycerides: 86 mg/dL (ref 0.0–149.0)
VLDL: 17.2 mg/dL (ref 0.0–40.0)

## 2019-08-22 DIAGNOSIS — H40013 Open angle with borderline findings, low risk, bilateral: Secondary | ICD-10-CM | POA: Diagnosis not present

## 2019-08-22 DIAGNOSIS — Z961 Presence of intraocular lens: Secondary | ICD-10-CM | POA: Diagnosis not present

## 2019-08-22 DIAGNOSIS — Z9842 Cataract extraction status, left eye: Secondary | ICD-10-CM | POA: Diagnosis not present

## 2019-08-22 DIAGNOSIS — H5989 Other postprocedural complications and disorders of eye and adnexa, not elsewhere classified: Secondary | ICD-10-CM | POA: Diagnosis not present

## 2019-08-23 ENCOUNTER — Ambulatory Visit: Payer: PPO | Admitting: Physician Assistant

## 2019-09-01 ENCOUNTER — Ambulatory Visit: Payer: PPO

## 2019-09-01 ENCOUNTER — Ambulatory Visit: Payer: PPO | Attending: Internal Medicine

## 2019-09-01 DIAGNOSIS — Z23 Encounter for immunization: Secondary | ICD-10-CM | POA: Insufficient documentation

## 2019-09-01 NOTE — Progress Notes (Signed)
   Covid-19 Vaccination Clinic  Name:  Kyle Sanchez    MRN: [id number] DOB: 12-12-1945  09/01/2019  [name] was observed post Covid-19 immunization for 15 minutes without incidence. He was provided with Vaccine Information Sheet and instruction to access the V-Safe system.   [name] was instructed to call 911 with any severe reactions post vaccine: [name] Kitchen Difficulty breathing  . Swelling of your face and throat  . A fast heartbeat  . A bad rash all over your body  . Dizziness and weakness    Immunizations Administered    Name Date Dose VIS Date Route   Pfizer COVID-19 Vaccine 09/01/2019  1:47 PM 0.3 mL 06/22/2019 Intramuscular   Manufacturer: [place]   Lot: [id number]   [place]: [id number]

## 2019-09-26 ENCOUNTER — Ambulatory Visit: Payer: PPO | Attending: Internal Medicine

## 2019-09-26 DIAGNOSIS — Z23 Encounter for immunization: Secondary | ICD-10-CM

## 2019-09-26 NOTE — Progress Notes (Signed)
   Covid-19 Vaccination Clinic  Name:  Kyle Sanchez    MRN: [id number] DOB: [birth year]  09/26/2019  Kyle Sanchez was observed post Covid-19 immunization for 15 minutes without incident. He was provided with Vaccine Information Sheet and instruction to access the V-Safe system.   Kyle Sanchez was instructed to call 911 with any severe reactions post vaccine: [name] Kitchen Difficulty breathing  . Swelling of face and throat  . A fast heartbeat  . A bad rash all over body  . Dizziness and weakness   Immunizations Administered    Name Date Dose VIS Date Route   Pfizer COVID-19 Vaccine 09/26/2019  1:03 PM 0.3 mL 06/22/2019 Intramuscular   Manufacturer: [place]   Lot: [id number]   [place]: [id number]

## 2019-10-01 ENCOUNTER — Encounter: Payer: Self-pay | Admitting: Gastroenterology

## 2019-10-01 ENCOUNTER — Ambulatory Visit (INDEPENDENT_AMBULATORY_CARE_PROVIDER_SITE_OTHER): Payer: PPO | Admitting: Gastroenterology

## 2019-10-01 VITALS — BP 134/62 | HR 72 | Temp 97.7°F | Ht 66.0 in | Wt 164.0 lb

## 2019-10-01 DIAGNOSIS — R1030 Lower abdominal pain, unspecified: Secondary | ICD-10-CM | POA: Diagnosis not present

## 2019-10-01 DIAGNOSIS — K581 Irritable bowel syndrome with constipation: Secondary | ICD-10-CM

## 2019-10-01 DIAGNOSIS — R1013 Epigastric pain: Secondary | ICD-10-CM

## 2019-10-01 DIAGNOSIS — K649 Unspecified hemorrhoids: Secondary | ICD-10-CM

## 2019-10-01 DIAGNOSIS — Z86010 Personal history of colonic polyps: Secondary | ICD-10-CM | POA: Diagnosis not present

## 2019-10-01 DIAGNOSIS — K219 Gastro-esophageal reflux disease without esophagitis: Secondary | ICD-10-CM | POA: Diagnosis not present

## 2019-10-01 MED ORDER — CLENPIQ 10-3.5-12 MG-GM -GM/160ML PO SOLN: 1.0000 | ORAL | 0 refills | Status: DC

## 2019-10-01 NOTE — Patient Instructions (Addendum)
If you are age 75 or older, your body mass index should be between 23-30. Your Body mass index is 26.47 kg/m. If this is out of the aforementioned range listed, please consider follow up with your Primary Care Provider.  If you are age 51 or younger, your body mass index should be between 19-25. Your Body mass index is 26.47 kg/m. If this is out of the aformentioned range listed, please consider follow up with your Primary Care Provider.   We have sent the following medications to your pharmacy for you to pick up at your convenience: Clenpiq ( prep for Colonoscopy)   Due to recent changes in healthcare laws, you may see the results of your imaging and laboratory studies on MyChart before your provider has had a chance to review them.  We understand that in some cases there may be results that are confusing or concerning to you. Not all laboratory results come back in the same time frame and the provider may be waiting for multiple results in order to interpret others.  Please give [place] 48 hours in order for your provider to thoroughly review all the results before contacting the office for clarification of your results.   Thank you for choosing me and [facility]

## 2019-10-01 NOTE — Progress Notes (Signed)
P  Chief Complaint:    GERD, abdominal pain  GI History: 74 year old male initially seen by me in 05/2019 for evaluation of TIF as a means to better control reflux.  Longstanding history of reflux symptoms for 50+ years, with index symptoms of water brash, regurgitation, and heartburn.  Has followed with multiple GI providers at [facility] and [facility].  Per review of notes, has previously trialed AcipHex, omeprazole, Reglan, Carafate, Dexilant, Protonix. Currently prescribed omeprazole 40 mg BID, which controls symptoms generally well.  Breakthrough symptoms with any missed doses, particularly in the evening/early a.m.   Not reliably a/w certain foods. No dysphagia.  Is concerned about continuing to take PPI due to fear of ADR profile.  Previously discussed TIF, which he wants to give more thought to.  Separately, history of nonulcer dyspepsia, which is just as bothersome as typical reflux sxs. Can be soreness, discomfort, and pressure throughout abdomen, but not frank pain. No hematochezia or melena. Maintians healthy diet and active lifestyle with regular exercise. Drinks plenty of water. Was previously prescribed Pamelor, with improvement in sxs, but he stopped taking and has not resumed.  Previously followed with Dr. Alice Reichert at [place], last seen in 09/2018 for apparent functional dyspepsia and reflux symptoms.  At that time, symptoms responded well to nortriptyline 25 mg nightly and continued omeprazole.  Added Pepcid qhs for continued symptoms, but patient did not take for long.  He had previously discussed referral for surgical intervention for reflux with Dr. Candace Cruise at [place] in 2016.  History of open cholecystectomy at age 29.  Separately, he c/o chronic constipation.  Currently well controlled with MiraLAX.   Endoscopic history: -EGD (07/2018, Dr. Alice Reichert, [place] [facility]): 1 cm HH, Hill grade 2 valve.  Biopsies negative for EOE -Colonoscopy (07/2018, Dr. Alice Reichert,  [place] [facility]): 1 tubular adenoma   -Hydrogen breath test (05/2018, [facility]): Negative/normal -GES (06/2013): Diminished emptying in the initial 3 hours (0% at 1 hour, 23% at 2 hours, 65% at 3 hours) but 97% emptying at 4 hours c/w slowed emptying but not frank gastroparesis. -Bravo 48-hour study (12/2011): Significant reflux with 134 refluxate events, pH <4 10.3% (normal 2.42%).  DeMeester score 46.6 (normal <14.7).  -If planning on antireflux surgery, recommend repeat GES  HPI:    Patient is a 74 y.o. male presenting to the Gastroenterology Clinic for follow-up.  Initially seen by me in 05/2019 for evaluation of GERD and possible TIF.  At that time, he was reluctant to proceed with any antireflux surgery, and instead continued taking omeprazole (changed evening dose to predinner; was taking qhs), with consideration for Pepcid qhs pending clinical response.  Resumed Pamelor for nonulcer dyspepsia, but he stopped taking this on his own shortly after that appointment.  Today, he states his reflux is still bothersome, but improved since changing to predinner evening dosing.  Not taking H2 RA.  Separately, he c/o intermittent abdominal pain.  Described as a burning sensation in the low central abdomen/suprapubic area.  Tends to occur before and after BM.  No radiation.  Separately, with right/left abdominal pain.  All of the symptoms are new since his last colonoscopy by Dr. Alice Reichert.  Not necessarily change in stool habits.  Finally, also  C/o episodes of "numbness" in rectum, lasting <1 min, and only occurs with BM. No leg numbness. Sxs then resolve and does not recur outside of BM. Has happened a couple times over last few months or so, 1-2 times/week. No pain. No bleeding.  Does not sit on the toilet for prolonged period time.  He is very concerned about each of these symptoms and highly concerned about a missed colonic lesion.  Review of systems:     No chest pain, no SOB, no fevers, no urinary sx    Past Medical History:  Diagnosis Date  . Allergic rhinitis   . Anxiety   . Arthritis   . Asthma   . Bell's palsy   . Depression   . Fibromyalgia   . GERD (gastroesophageal reflux disease)    neg path on EGD 06/14/13  . Heart murmur   . Hemorrhoids   . Hiatal hernia    1 cm hill grade 2   . History of migraines   . Hx of adenomatous colonic polyps 07/2002  . Hyperlipidemia   . Hypertension   . IBS (irritable bowel syndrome)   . Osteoarthritis     Patient's surgical history, family medical history, social history, medications and allergies were all reviewed in Epic    Current Outpatient Medications  Medication Sig Dispense Refill  . aspirin EC 81 MG tablet Take 81 mg by mouth daily.    . Cholecalciferol (D3-1000) 1000 units capsule Take 1,000 Units by mouth daily. Take 2 capsules daily.    . hydrocortisone (ANUSOL-HC) 25 MG suppository Place 1 suppository (25 mg total) rectally 2 (two) times daily. 12 suppository 0  . Omega-3 Fatty Acids (RA FISH OIL) 1400 MG CPDR Take by mouth daily.    [name] Kitchen omeprazole (PRILOSEC) 40 MG capsule Take 1 capsule (40 mg total) by mouth 2 (two) times daily as needed.    . polyethylene glycol (MIRALAX / GLYCOLAX) 17 g packet Take 17 g by mouth daily.    . SUPER B COMPLEX/C PO Take by mouth daily.    [name] Kitchen triamcinolone cream (KENALOG) 0.5 % Apply 1 application topically 2 (two) times daily as needed. 30 g 1  . Ubiquinol (QUNOL COQ10/UBIQUINOL/MEGA) 100 MG CAPS Take 100 mg by mouth daily.    . vitamin E 400 UNIT capsule Take 400 Units by mouth daily.     No current facility-administered medications for this visit.    Physical Exam:     BP 134/62   Pulse 72   Temp 97.7 F (36.5 C)   Ht 5\' 6"  (1.676 m)   Wt 164 lb (74.4 kg)   BMI 26.47 kg/m   GENERAL:  Pleasant male in NAD PSYCH: : Cooperative, normal affect EENT:  conjunctiva pink, mucous membranes moist, neck supple without masses CARDIAC:  RRR, no murmur heard, no peripheral edema PULM:  Normal respiratory effort, lungs CTA bilaterally, no wheezing ABDOMEN: Mild TTP in lower abdomen and epigastrium without rebound or guarding.  No peritoneal signs.  Nondistended, soft. No obvious masses, no hepatomegaly,  normal bowel sounds SKIN:  turgor, no lesions seen Musculoskeletal:  Normal muscle tone, normal strength NEURO: Alert and oriented x 3, no focal neurologic deficits Rectal: Exam deferred by patient    IMPRESSION and PLAN:    1) Abdominal pain 2) Perirectal irritation 3) Chronic constipation  Discussed DDx for presenting symptoms, to include GI and non-GI etiologies.  High suspicion for IBS-C/functional syndrome.  However, the patient is highly concerned with his symptoms, and reports that everything is new since his last colonoscopy.  He is very concerned about potential missed lesion on prior colonoscopy, along with evaluation for internal hemorrhoids.  Offered rectal exam/anoscopy today, which he declined a couple times.  Given new GI symptoms since  last procedure, elevated patient concerns, need for rectal examination/hemorrhoid evaluation with anesthesia, plan for the following:  -Colonoscopy -Discussed if clinically significant hemorrhoids, plan for hemorrhoid band ligation.  Did discuss that this is an unsedated procedure done in the office, which he understands -Resume MiraLAX for constipation  4) GERD 5) Hiatal hernia -Again discussed the risks and benefits of TIF today.  He would like to proceed with medical management for now, but potentially revisit this topic in the future -Continue PPI as currently prescribed -Continue antireflux lifestyle/dietary modifications  6) Dyspepsia: -Continue treating as above -Has since stopped Pamelor despite previous good clinical response  7) History of tubular adenoma: -Single tubular adenoma on colonoscopy in 2020.  Can certainly perform polyp surveillance at time of colonoscopy as above.     The indications, risks, and  benefits of colonoscopy were explained to the patient in detail. Risks include but are not limited to bleeding, perforation, adverse reaction to medications, and cardiopulmonary compromise. Sequelae include but are not limited to the possibility of surgery, hospitalization, and mortality. The patient verbalized understanding and wished to proceed. All questions answered, referred to the scheduler and bowel prep ordered. Further recommendations pending results of the exam.        Lavena Bullion ,DO, FACG 10/01/2019, 2:07 PM

## 2019-10-17 ENCOUNTER — Encounter: Payer: Self-pay | Admitting: Gastroenterology

## 2019-10-22 ENCOUNTER — Other Ambulatory Visit: Payer: Self-pay

## 2019-10-22 ENCOUNTER — Encounter: Payer: Self-pay | Admitting: Gastroenterology

## 2019-10-22 ENCOUNTER — Ambulatory Visit (AMBULATORY_SURGERY_CENTER): Payer: PPO | Admitting: Gastroenterology

## 2019-10-22 VITALS — BP 142/74 | HR 53 | Temp 96.6°F | Resp 15 | Ht 66.0 in | Wt 164.0 lb

## 2019-10-22 DIAGNOSIS — D124 Benign neoplasm of descending colon: Secondary | ICD-10-CM

## 2019-10-22 DIAGNOSIS — R1030 Lower abdominal pain, unspecified: Secondary | ICD-10-CM | POA: Diagnosis not present

## 2019-10-22 DIAGNOSIS — K635 Polyp of colon: Secondary | ICD-10-CM

## 2019-10-22 DIAGNOSIS — R109 Unspecified abdominal pain: Secondary | ICD-10-CM | POA: Diagnosis not present

## 2019-10-22 DIAGNOSIS — K649 Unspecified hemorrhoids: Secondary | ICD-10-CM

## 2019-10-22 DIAGNOSIS — K641 Second degree hemorrhoids: Secondary | ICD-10-CM

## 2019-10-22 MED ORDER — SODIUM CHLORIDE 0.9 % IV SOLN: 500.0000 mL | Freq: Once | INTRAVENOUS | Status: DC

## 2019-10-22 NOTE — Progress Notes (Signed)
Called to room to assist during endoscopic procedure.  Patient ID and intended procedure confirmed with present staff. Received instructions for my participation in the procedure from the performing physician.  

## 2019-10-22 NOTE — Progress Notes (Signed)
To PACU, VSS. Report to [email or web address] 

## 2019-10-22 NOTE — Op Note (Signed)
[facility] Patient Name: Kyle Sanchez Procedure Date: 10/22/2019 3:12 PM MRN: [id number] Endoscopist: Gerrit Heck , MD Age: 74 Referring MD:  Date of Birth: 09/25/1945 Gender: Male Account #: [id number] Procedure:                Colonoscopy Indications:              Lower abdominal pain, Suspected hemorrhoids,                            Perianal symptoms                           Tubular adenoma on previous colonoscopy. Medicines:                Monitored Anesthesia Care Procedure:                Pre-Anesthesia Assessment:                           - Prior to the procedure, a History and Physical                            was performed, and patient medications and                            allergies were reviewed. The patient's tolerance of                            previous anesthesia was also reviewed. The risks                            and benefits of the procedure and the sedation                            options and risks were discussed with the patient.                            All questions were answered, and informed consent                            was obtained. Prior Anticoagulants: The patient has                            taken no previous anticoagulant or antiplatelet                            agents. ASA Grade Assessment: II - A patient with                            mild systemic disease. After reviewing the risks                            and benefits, the patient was deemed in  satisfactory condition to undergo the procedure.                           After obtaining informed consent, the colonoscope                            was passed under direct vision. Throughout the                            procedure, the patient's blood pressure, pulse, and                            oxygen saturations were monitored continuously. The                            Colonoscope was introduced through the anus and                   advanced to the the terminal ileum. The colonoscopy                            was performed without difficulty. The patient                            tolerated the procedure well. The quality of the                            bowel preparation was excellent. The terminal                            ileum, ileocecal valve, appendiceal orifice, and                            rectum were photographed. Scope In: 3:17:42 PM Scope Out: 3:33:20 PM Scope Withdrawal Time: 0 hours 14 minutes 5 seconds  Total Procedure Duration: 0 hours 15 minutes 38 seconds  Findings:                 Hemorrhoids were found on perianal exam. Perianal                            exam otherwise unremarkable.                           Two sessile polyps were found in the sigmoid colon                            and descending colon. The polyps were 2 to 4 mm in                            size. These polyps were removed with a cold snare.                            Resection complete; retrieved the larger polyp. The  smaller polyp disintegrated in the colonscope                            channel upon retrieval. Estimated blood loss was                            minimal.                           Non-bleeding internal hemorrhoids were found during                            retroflexion. The hemorrhoids were small and Grade                            II (internal hemorrhoids that prolapse but reduce                            spontaneously).                           The exam was otherwise normal throughout the                            remainder of the colon.                           The terminal ileum appeared normal. Complications:            No immediate complications. Estimated Blood Loss:     Estimated blood loss was minimal. Impression:               - Hemorrhoids found on perianal exam.                           - Two 2 to 4 mm polyps in the sigmoid colon and in                             the descending colon, removed with a cold snare.                            Resected and retrieved.                           - Non-bleeding internal hemorrhoids.                           - The examined portion of the ileum was normal. Recommendation:           - Patient has a contact number available for                            emergencies. The signs and symptoms of potential                            delayed complications were discussed with the  patient. Return to normal activities tomorrow.                            Written discharge instructions were provided to the                            patient.                           - Resume previous diet.                           - Continue present medications.                           - Await pathology results.                           - Repeat colonoscopy in 5-7 years for surveillance                            based on pathology results.                           - Return to GI office PRN.                           - Use fiber, for example Citrucel, Fibercon, Konsyl                            or Metamucil.                           - Internal hemorrhoids were noted on this study and                            may be amenable to hemorrhoid band ligation. If you                            are interested in further treatment of these                            hemorrhoids with band ligation, please contact my                            clinic to set up an appointment for evaluation and                            treatment. Gerrit Heck, MD 10/22/2019 3:41:36 PM

## 2019-10-22 NOTE — Progress Notes (Signed)
Temp by LC. VS by KA. 

## 2019-10-22 NOTE — Patient Instructions (Signed)
HANDOUTS ON POLYPS & HEMORRHOIDS GIVEN TO YOU TODAY  HANDOUT WITH INFORMATION ON BANDING OF HEMORRHOIDS GIVEN TO YOU TODAY  USE FIBER SUCH AS CITRUCEL,FIBERCON, [place]   AWAIT PATHOLOGY RESULTS ON POLYPS REMOVED     YOU HAD AN ENDOSCOPIC PROCEDURE TODAY AT [facility]:   Refer to the procedure report that was given to you for any specific questions about what was found during the examination.  If the procedure report does not answer your questions, please call your gastroenterologist to clarify.  If you requested that your care partner not be given the details of your procedure findings, then the procedure report has been included in a sealed envelope for you to review at your convenience later.  YOU SHOULD EXPECT: Some feelings of bloating in the abdomen. Passage of more gas than usual.  Walking can help get rid of the air that was put into your GI tract during the procedure and reduce the bloating. If you had a lower endoscopy (such as a colonoscopy or flexible sigmoidoscopy) you may notice spotting of blood in your stool or on the toilet paper. If you underwent a bowel prep for your procedure, you may not have a normal bowel movement for a few days.  Please Note:  You might notice some irritation and congestion in your nose or some drainage.  This is from the oxygen used during your procedure.  There is no need for concern and it should clear up in a day or so.  SYMPTOMS TO REPORT IMMEDIATELY:   Following lower endoscopy (colonoscopy or flexible sigmoidoscopy):  Excessive amounts of blood in the stool  Significant tenderness or worsening of abdominal pains  Swelling of the abdomen that is new, acute  Fever of 100F or higher    For urgent or emergent issues, a gastroenterologist can be reached at any hour by calling [phone]. Do not use MyChart messaging for urgent concerns.    DIET:  We do recommend a small meal at first, but then you may  proceed to your regular diet.  Drink plenty of fluids but you should avoid alcoholic beverages for 24 hours.  ACTIVITY:  You should plan to take it easy for the rest of today and you should NOT DRIVE or use heavy machinery until tomorrow (because of the sedation medicines used during the test).    FOLLOW UP: Our staff will call the number listed on your records 48-72 hours following your procedure to check on you and address any questions or concerns that you may have regarding the information given to you following your procedure. If we do not reach you, we will leave a message.  We will attempt to reach you two times.  During this call, we will ask if you have developed any symptoms of COVID 19. If you develop any symptoms (ie: fever, flu-like symptoms, shortness of breath, cough etc.) before then, please call [phone].  If you test positive for Covid 19 in the 2 weeks post procedure, please call and report this information to [place].    If any biopsies were taken you will be contacted by phone or by letter within the next 1-3 weeks.  Please call us at [phone] if you have not heard about the biopsies in 3 weeks.    SIGNATURES/CONFIDENTIALITY: You and/or your care partner have signed paperwork which will be entered into your electronic medical record.  These signatures attest to the fact that that the information above on your  After Visit Summary has been reviewed and is understood.  Full responsibility of the confidentiality of this discharge information lies with you and/or your care-partner.

## 2019-10-24 ENCOUNTER — Telehealth: Payer: Self-pay | Admitting: *Deleted

## 2019-10-24 ENCOUNTER — Telehealth: Payer: Self-pay

## 2019-10-24 NOTE — Telephone Encounter (Signed)
NO ANSWER, MESSAGE LEFT FOR PATIENT. 

## 2019-10-24 NOTE — Telephone Encounter (Signed)
  Follow up Call-  Call back number 10/22/2019  Post procedure Call Back phone  # [phone]  Permission to leave phone message Yes  Some recent data might be hidden     Patient questions:  Do you have a fever, pain , or abdominal swelling? No. Pain Score  0 *  Have you tolerated food without any problems? Yes.    Have you been able to return to your normal activities? Yes.    Do you have any questions about your discharge instructions: Diet   No. Medications  No. Follow up visit  No.  Do you have questions or concerns about your Care? No.  Actions: * If pain score is 4 or above: No action needed, pain <4.    1. Have you developed a fever since your procedure? no  2.   Have you had an respiratory symptoms (SOB or cough) since your procedure? no  3.   Have you tested positive for COVID 19 since your procedure no  4.   Have you had any family members/close contacts diagnosed with the COVID 19 since your procedure?  no   If yes to any of these questions please route to Joylene John, RN and Erenest Rasher, RN

## 2019-11-02 ENCOUNTER — Encounter: Payer: Self-pay | Admitting: Gastroenterology

## 2020-02-05 ENCOUNTER — Other Ambulatory Visit: Payer: Self-pay

## 2020-02-19 ENCOUNTER — Other Ambulatory Visit: Payer: Self-pay | Admitting: Family Medicine

## 2020-04-11 ENCOUNTER — Other Ambulatory Visit: Payer: Self-pay | Admitting: Family Medicine

## 2020-04-11 DIAGNOSIS — E785 Hyperlipidemia, unspecified: Secondary | ICD-10-CM

## 2020-04-11 DIAGNOSIS — Z125 Encounter for screening for malignant neoplasm of prostate: Secondary | ICD-10-CM

## 2020-04-22 ENCOUNTER — Other Ambulatory Visit: Payer: Self-pay

## 2020-04-22 ENCOUNTER — Other Ambulatory Visit (INDEPENDENT_AMBULATORY_CARE_PROVIDER_SITE_OTHER): Payer: PPO

## 2020-04-22 ENCOUNTER — Telehealth: Payer: Self-pay

## 2020-04-22 ENCOUNTER — Ambulatory Visit: Payer: PPO

## 2020-04-22 DIAGNOSIS — Z125 Encounter for screening for malignant neoplasm of prostate: Secondary | ICD-10-CM

## 2020-04-22 DIAGNOSIS — E785 Hyperlipidemia, unspecified: Secondary | ICD-10-CM

## 2020-04-22 LAB — COMPREHENSIVE METABOLIC PANEL
ALT: 18 U/L (ref 0–53)
AST: 19 U/L (ref 0–37)
Albumin: 4.3 g/dL (ref 3.5–5.2)
Alkaline Phosphatase: 67 U/L (ref 39–117)
BUN: 16 mg/dL (ref 6–23)
CO2: 33 mEq/L — ABNORMAL HIGH (ref 19–32)
Calcium: 9.9 mg/dL (ref 8.4–10.5)
Chloride: 102 mEq/L (ref 96–112)
Creatinine, Ser: 0.91 mg/dL (ref 0.40–1.50)
GFR: 81.83 mL/min (ref >60.00)
Glucose, Bld: 98 mg/dL (ref 70–99)
Potassium: 4.4 mEq/L (ref 3.5–5.1)
Sodium: 140 mEq/L (ref 135–145)
Total Bilirubin: 0.4 mg/dL (ref 0.2–1.2)
Total Protein: 7 g/dL (ref 6.0–8.3)

## 2020-04-22 LAB — PSA, MEDICARE: PSA: 2.19 ng/ml (ref 0.10–4.00)

## 2020-04-22 LAB — LIPID PANEL
Cholesterol: 170 mg/dL (ref 0–200)
HDL: 50.7 mg/dL (ref >39.00)
LDL Cholesterol: 102 mg/dL — ABNORMAL HIGH (ref 0–99)
NonHDL: 119.7
Total CHOL/HDL Ratio: 3
Triglycerides: 89 mg/dL (ref 0.0–149.0)
VLDL: 17.8 mg/dL (ref 0.0–40.0)

## 2020-04-22 NOTE — Telephone Encounter (Signed)
Called patient four times trying to complete his medicare visit. Keep going straight to voicemail. Left message notifying patient I called and appointment would be cancelled. Will be completed by provider at upcoming physical.

## 2020-04-28 ENCOUNTER — Encounter: Payer: PPO | Admitting: Family Medicine

## 2020-05-12 ENCOUNTER — Ambulatory Visit (INDEPENDENT_AMBULATORY_CARE_PROVIDER_SITE_OTHER): Payer: PPO | Admitting: Family Medicine

## 2020-05-12 ENCOUNTER — Other Ambulatory Visit: Payer: Self-pay

## 2020-05-12 ENCOUNTER — Encounter: Payer: Self-pay | Admitting: Family Medicine

## 2020-05-12 VITALS — BP 136/72 | HR 62 | Temp 97.7°F | Ht 66.33 in | Wt 162.0 lb

## 2020-05-12 DIAGNOSIS — Z0000 Encounter for general adult medical examination without abnormal findings: Secondary | ICD-10-CM | POA: Diagnosis not present

## 2020-05-12 DIAGNOSIS — K219 Gastro-esophageal reflux disease without esophagitis: Secondary | ICD-10-CM

## 2020-05-12 DIAGNOSIS — K649 Unspecified hemorrhoids: Secondary | ICD-10-CM

## 2020-05-12 DIAGNOSIS — Z7189 Other specified counseling: Secondary | ICD-10-CM

## 2020-05-12 MED ORDER — OMEPRAZOLE 40 MG PO CPDR
40.0000 mg | DELAYED_RELEASE_CAPSULE | Freq: Two times a day (BID) | ORAL | Status: DC | PRN
Start: 2020-05-12 — End: 2020-08-08

## 2020-05-12 NOTE — Patient Instructions (Addendum)
Try going back to twice a day omeprazole for about 2 weeks and see if that helps.  I would keep taking vitamin D but you could stop all other vitamins to see if that would help.   I would get the booster shot and then get a flu shot.  It is reasonable to consider getting the banding procedure done.   Take care.  Glad to see you.

## 2020-05-12 NOTE — Progress Notes (Signed)
This visit occurred during the SARS-CoV-2 public health emergency.  Safety protocols were in place, including screening questions prior to the visit, additional usage of staff PPE, and extensive cleaning of exam room while observing appropriate contact time as indicated for disinfecting solutions.  I have personally reviewed the Medicare Annual Wellness questionnaire and have noted 1. The patient's medical and social history 2. Their use of alcohol, tobacco or illicit drugs 3. Their current medications and supplements 4. The patient's functional ability including ADL's, fall risks, home safety risks and hearing or visual             impairment. 5. Diet and physical activities 6. Evidence for depression or mood disorders  The patients weight, height, BMI have been recorded in the chart and visual acuity is per eye clinic.  I have made referrals, counseling and provided education to the patient based review of the above and I have provided the pt with a written personalized care plan for preventive services.  Provider list updated- see scanned forms.  Routine anticipatory guidance given to patient.  See health maintenance. The possibility exists that previously documented standard health maintenance information may have been brought forward from a previous encounter into this note.  If needed, that same information has been updated to reflect the current situation based on today's encounter.    Flu defer per patient request with follow-up Covid shot pending. Shingles previously done per patient report 2021 PNA up-to-date Tetanus 2006, discussed with patient. Covid vaccine previously done with booster pending. Colon cancer screening 21 with colonoscopy Prostate cancer screening 2021 Advance directive-wife designated patient were incapacitated. Cognitive function addressed- see scanned forms- and if abnormal then additional documentation follows.   He is still exercising at baseline.     Hemorrhoids d/w pt.  Burning locally.  He hasn't had banding yet.  Colonoscopy 2021.  He is considering intervention/banding.  Still with GERD.  He tried cutting PPI back to QD.  Some days he has more sx than others.  He hasn't had upper intervention yet.  Unclear how much extra benefit he had with BID dosing of PPI.    PMH and SH reviewed  Meds, vitals, and allergies reviewed.   ROS: Per HPI.  Unless specifically indicated otherwise in HPI, the patient denies:  General: fever. Eyes: acute vision changes ENT: sore throat Cardiovascular: chest pain Respiratory: SOB GI: vomiting GU: dysuria Musculoskeletal: acute back pain Derm: acute rash Neuro: acute motor dysfunction Psych: worsening mood Endocrine: polydipsia Heme: bleeding Allergy: hayfever  GEN: nad, alert and oriented HEENT: ncat NECK: supple w/o LA CV: rrr. PULM: ctab, no inc wob ABD: soft, +bs EXT: no edema SKIN: no acute rash

## 2020-05-14 NOTE — Assessment & Plan Note (Signed)
Flu defer per patient request with follow-up Covid shot pending. Shingles previously done per patient report 2021 PNA up-to-date Tetanus 2006, discussed with patient. Covid vaccine previously done with booster pending. Colon cancer screening 21 with colonoscopy Prostate cancer screening 2021 Advance directive-wife designated patient were incapacitated. Cognitive function addressed- see scanned forms- and if abnormal then additional documentation follows.   He is still exercising at baseline.

## 2020-05-14 NOTE — Assessment & Plan Note (Signed)
Advance directive-wife designated patient were incapacitated. 

## 2020-05-14 NOTE — Assessment & Plan Note (Signed)
Still with GERD.  He tried cutting PPI back to QD.  Some days he has more sx than others.  He hasn't had upper intervention yet.  Unclear how much extra benefit he had with BID dosing of PPI.  He will restart twice daily PPI dosing in the meantime and see if that helps.  He is considering follow-up with GI.

## 2020-05-14 NOTE — Assessment & Plan Note (Signed)
Hemorrhoids d/w pt.  Burning locally.  He hasn't had banding yet.  Colonoscopy 2021.  He is considering intervention/banding.  I will defer.  He agrees.

## 2020-05-16 ENCOUNTER — Ambulatory Visit (INDEPENDENT_AMBULATORY_CARE_PROVIDER_SITE_OTHER): Payer: PPO | Admitting: Gastroenterology

## 2020-05-16 ENCOUNTER — Other Ambulatory Visit: Payer: Self-pay

## 2020-05-16 ENCOUNTER — Encounter: Payer: Self-pay | Admitting: Gastroenterology

## 2020-05-16 VITALS — BP 130/78 | HR 77 | Ht 66.0 in | Wt 164.5 lb

## 2020-05-16 DIAGNOSIS — R1013 Epigastric pain: Secondary | ICD-10-CM | POA: Diagnosis not present

## 2020-05-16 DIAGNOSIS — K449 Diaphragmatic hernia without obstruction or gangrene: Secondary | ICD-10-CM

## 2020-05-16 DIAGNOSIS — K641 Second degree hemorrhoids: Secondary | ICD-10-CM | POA: Diagnosis not present

## 2020-05-16 DIAGNOSIS — K219 Gastro-esophageal reflux disease without esophagitis: Secondary | ICD-10-CM

## 2020-05-16 DIAGNOSIS — N5082 Scrotal pain: Secondary | ICD-10-CM | POA: Diagnosis not present

## 2020-05-16 DIAGNOSIS — R208 Other disturbances of skin sensation: Secondary | ICD-10-CM | POA: Diagnosis not present

## 2020-05-16 MED ORDER — NORTRIPTYLINE HCL 25 MG PO CAPS: 25.0000 mg | ORAL_CAPSULE | Freq: Every day | ORAL | 0 refills | Status: DC

## 2020-05-16 NOTE — Patient Instructions (Signed)
If you are age 75 or older, your body mass index should be between 23-30. Your Body mass index is 26.55 kg/m. If this is out of the aforementioned range listed, please consider follow up with your Primary Care Provider.  If you are age 55 or younger, your body mass index should be between 19-25. Your Body mass index is 26.55 kg/m. If this is out of the aformentioned range listed, please consider follow up with your Primary Care Provider.   You have been scheduled for a CT scan of the abdomen and pelvis at [facility] ([address]---this is in the same building as Press [facility]).   You are scheduled on 05/27/2020 at 3:30pm. You should arrive 15 minutes prior to your appointment time for registration. Please follow the written instructions below on the day of your exam:  WARNING: IF YOU ARE ALLERGIC TO IODINE/X-RAY DYE, PLEASE NOTIFY RADIOLOGY IMMEDIATELY AT [phone]! YOU WILL BE GIVEN A 13 HOUR PREMEDICATION PREP.  1) Do not eat or drink anything after 11:30am (4 hours prior to your test) 2) You have been given 2 bottles of oral contrast to drink. The solution may taste better if refrigerated, but do NOT add ice or any other liquid to this solution. Shake well before drinking.    Drink 1 bottle of contrast @ 1:30 pm(2 hours prior to your exam)  Drink 1 bottle of contrast @ 2:30pm (1 hour prior to your exam)  You may take any medications as prescribed with a small amount of water, if necessary. If you take any of the following medications: METFORMIN, GLUCOPHAGE, GLUCOVANCE, AVANDAMET, RIOMET, FORTAMET, [place] MET, JANUMET, GLUMETZA or METAGLIP, you MAY be asked to HOLD this medication 48 hours AFTER the exam.  The purpose of you drinking the oral contrast is to aid in the visualization of your intestinal tract. The contrast solution may cause some diarrhea. Depending on your individual set of symptoms, you may also receive an intravenous injection of x-ray contrast/dye.  Plan on being at [facility] for 30 minutes or longer, depending on the type of exam you are having performed.  This test typically takes 30-45 minutes to complete.  If you have any questions regarding your exam or if you need to reschedule, you may call the CT department at [phone] between the hours of 8:00 am and 5:00 pm, Monday-Friday.  ________________________________________________________________________ We have placed a referral to Urology. They will contact you to schedule and appointment. If you do not hear from them within a week please contact our office at [phone].  We have sent the following medications to your pharmacy for you to pick up at your convenience:  nortriptine 25 mg take 1 tablet at bedtime.  Due to recent changes in healthcare laws, you may see the results of your imaging and laboratory studies on MyChart before your provider has had a chance to review them.  We understand that in some cases there may be results that are confusing or concerning to you. Not all laboratory results come back in the same time frame and the provider may be waiting for multiple results in order to interpret others.  Please give [place] 48 hours in order for your provider to thoroughly review all the results before contacting the office for clarification of your results.

## 2020-05-16 NOTE — Progress Notes (Signed)
P  Chief Complaint:    GERD   GI History: 74 year old male follows in the GI clinic for multiple issues as below:  Longstanding history of reflux symptomsfor 50+ years, with index symptoms of water brash, regurgitation, and heartburn.Has followed with multiple GI providers at [facility] and [facility]. Has previously trialed AcipHex, omeprazole, Reglan, Carafate, Dexilant, Protonix.Currentlyprescribedomeprazole 40 mg BID, which controls symptoms generally well.  Breakthrough symptoms with any missed doses, particularly in the evening/early a.m.   Not reliably a/w certain foods. No dysphagia.Is concerned about continuing to take PPI due to fear of ADR profile.  Previously discussed TIF, but has opted for medical management so far.  Separately, history of nonulcer dyspepsia, which is just as bothersome as typical reflux sxs. Can be soreness, discomfort, and pressurethroughout abdomen, but not frank pain. No hematochezia or melena. Maintians healthy diet and active lifestylewith regular exercise. Drinks plenty of water. Was previously prescribed Pamelor, with improvement in sxs, but he stopped taking and has not resumed.  Previously followed with Dr. Alice Reichert at [facility], last seen in 3/2038forapparent functional dyspepsia and reflux symptoms. At that time, symptoms responded welltonortriptyline 25 mg nightly andcontinued omeprazole. Added Pepcid qhsfor continued symptoms, but patient did not take for long.  He had previouslydiscussed referral forsurgical intervention for reflux with Dr.[facility] in 2016.  History ofopencholecystectomy at age 41.  Separately, hec/ochronic constipation. Currently well controlled withMiraLAX.   Endoscopic history: -EGD (07/2018, Dr. Alice Reichert, [place] [facility]): 1 cm HH, Hill grade 2 valve. Biopsies negative for EOE -Colonoscopy (07/2018, Dr. Alice Reichert, [facility] [facility]): 1 tubular adenoma -Colonoscopy (10/22/2019, Dr.  Bryan Lemma): 2 polyps in sigmoid/descending (path: HP), grade 2 internal hemorrhoids.  Repeat 7 years   -Hydrogen breath test (05/2018, [facility]): Negative/normal -GES (06/2013): Diminished emptying in the initial 3 hours (0% at 1 hour, 23% at 2 hours, 65% at 3 hours) but 97% emptying at 4 hoursc/wslowed emptying but not frank gastroparesis. -Bravo 48-hour study (12/2011): Significant reflux with134refluxate events, pH<410.3% (normal 2.42%). DeMeester score 46.6(normal <14.7).  -If planning on antireflux surgery, recommend repeat GES  HPI:     Patient is a 74 y.o. male presenting to the Gastroenterology Clinic for follow-up.  Last seen by me on 10/01/2019.  Since then, completed colonoscopy on 10/22/2019 as outlined above, notable for internal hemorrhoids and hyperplastic polyps.  Today, his main issue is intermittent scrotal pain and burning in the suprapubic area.  This has occurred intermittently for a long time.  For his reflux, he had been missing second dose of Prilosec frequently.  Was recently seen by Dr. Damita Dunnings who instructed him to continue taking  BID x2 weeks to assess if high dose was an effective.  After that, was instructed to stop taking his multiple vitamin supplements to again assess for clinical improvement in his myriad of symptoms.  He is otherwise still not interested in antireflux surgical options.  Separately having hemorrhoidal sxs of rectal burning. Still with the intermittent numbness when wiping, lasting <1 minute.     Review of systems:     No chest pain, no SOB, no fevers, no urinary sx   Past Medical History:  Diagnosis Date  . Allergic rhinitis   . Anxiety   . Arthritis   . Asthma   . Bell's palsy   . Cataract   . Depression   . Fibromyalgia   . GERD (gastroesophageal reflux disease)    neg path on EGD 06/14/13  . Heart murmur   . Hemorrhoids   .  Hiatal hernia    1 cm hill grade 2   . History of migraines   . Hx of adenomatous colonic polyps  07/2002  . Hyperlipidemia   . Hypertension   . IBS (irritable bowel syndrome)   . Osteoarthritis     Patient's surgical history, family medical history, social history, medications and allergies were all reviewed in Epic    Current Outpatient Medications  Medication Sig Dispense Refill  . aspirin EC 81 MG tablet Take 81 mg by mouth daily.    . Cholecalciferol (D3-1000) 1000 units capsule Take 1,000 Units by mouth daily. Take 2 capsules daily.    . Omega-3 Fatty Acids (RA FISH OIL) 1400 MG CPDR Take by mouth daily.    [name] Kitchen omeprazole (PRILOSEC) 40 MG capsule Take 1 capsule (40 mg total) by mouth 2 (two) times daily as needed.    . polyethylene glycol (MIRALAX / GLYCOLAX) 17 g packet Take 17 g by mouth daily.    . SUPER B COMPLEX/C PO Take by mouth daily.    [name] Kitchen Ubiquinol (QUNOL COQ10/UBIQUINOL/MEGA) 100 MG CAPS Take 100 mg by mouth daily.    . vitamin E 400 UNIT capsule Take 400 Units by mouth daily.     No current facility-administered medications for this visit.    Physical Exam:     BP 130/78 (BP Location: Right Arm, Patient Position: Sitting, Cuff Size: Normal)   Pulse 77   Ht $R'5\' 6"'Lr$  (1.676 m)   Wt 164 lb 8 oz (74.6 kg)   BMI 26.55 kg/m   GENERAL:  Pleasant male in NAD PSYCH: : Cooperative, normal affect NEURO: Alert and oriented x 3, no focal neurologic deficits   IMPRESSION and PLAN:    1) GERD 2) Hiatal hernia -Currently retrialing course of high-dose PPI bid.  Following that, will stop his multiple vitamin supplements and assess for clinical improvement with either/both of these scenarios -Has clear objective evidence of reflux, but also seems to have a multitude of superimposed hypersensitivity type symptoms  3) Nonulcer Dyspepsia -Previous good response to both nortriptyline.  Will restart that now at 25 mg qhs  4) Scrotal pain 5) Suprapubic burning sensation -Referral to Urology -CT abdomen/pelvis to evaluate for extraluminal pathology given significant patient  worry  6) Internal hemorrhoids -Again discussed hemorrhoid banding.  He is not ready for that at this juncture, and strongly prefers cross-sectional imaging first -CT unrevealing and pending Urology referral, can again discuss role of banding  I spent 35 minutes of time, including in depth chart review, independent review of results as outlined above, communicating results with the patient directly, face-to-face time with the patient, coordinating care, and ordering studies and medications as appropriate, and documentation.       Lavena Bullion ,DO, FACG 05/16/2020, 3:43 PM

## 2020-05-22 DIAGNOSIS — Z961 Presence of intraocular lens: Secondary | ICD-10-CM | POA: Diagnosis not present

## 2020-05-22 DIAGNOSIS — H01021 Squamous blepharitis right upper eyelid: Secondary | ICD-10-CM | POA: Diagnosis not present

## 2020-05-22 DIAGNOSIS — H353131 Nonexudative age-related macular degeneration, bilateral, early dry stage: Secondary | ICD-10-CM | POA: Diagnosis not present

## 2020-05-22 DIAGNOSIS — H52221 Regular astigmatism, right eye: Secondary | ICD-10-CM | POA: Diagnosis not present

## 2020-05-22 DIAGNOSIS — H01024 Squamous blepharitis left upper eyelid: Secondary | ICD-10-CM | POA: Diagnosis not present

## 2020-05-22 DIAGNOSIS — H5201 Hypermetropia, right eye: Secondary | ICD-10-CM | POA: Diagnosis not present

## 2020-05-27 ENCOUNTER — Ambulatory Visit (HOSPITAL_COMMUNITY)
Admission: RE | Admit: 2020-05-27 | Discharge: 2020-05-27 | Disposition: A | Discharge status: Home / Self Care | Payer: PPO | Source: Ambulatory Visit | Attending: Gastroenterology | Admitting: Gastroenterology

## 2020-05-27 ENCOUNTER — Encounter (HOSPITAL_COMMUNITY): Payer: Self-pay

## 2020-05-27 ENCOUNTER — Other Ambulatory Visit: Payer: Self-pay

## 2020-05-27 DIAGNOSIS — K219 Gastro-esophageal reflux disease without esophagitis: Secondary | ICD-10-CM | POA: Diagnosis not present

## 2020-05-27 DIAGNOSIS — N5082 Scrotal pain: Secondary | ICD-10-CM | POA: Diagnosis not present

## 2020-05-27 DIAGNOSIS — K649 Unspecified hemorrhoids: Secondary | ICD-10-CM | POA: Diagnosis not present

## 2020-05-27 DIAGNOSIS — R1013 Epigastric pain: Secondary | ICD-10-CM | POA: Insufficient documentation

## 2020-05-27 DIAGNOSIS — I700 Atherosclerosis of aorta: Secondary | ICD-10-CM | POA: Diagnosis not present

## 2020-05-27 DIAGNOSIS — K641 Second degree hemorrhoids: Secondary | ICD-10-CM | POA: Insufficient documentation

## 2020-05-27 LAB — POCT I-STAT CREATININE: Creatinine, Ser: 1 mg/dL (ref 0.61–1.24)

## 2020-05-27 MED ORDER — IOHEXOL 300 MG/ML  SOLN
100.0000 mL | Freq: Once | INTRAMUSCULAR | Status: AC | PRN
  Administered 2020-05-27: 100 mL via INTRAVENOUS

## 2020-06-11 ENCOUNTER — Other Ambulatory Visit: Payer: Self-pay

## 2020-06-11 ENCOUNTER — Ambulatory Visit (INDEPENDENT_AMBULATORY_CARE_PROVIDER_SITE_OTHER): Payer: PPO

## 2020-06-11 DIAGNOSIS — Z23 Encounter for immunization: Secondary | ICD-10-CM

## 2020-08-01 ENCOUNTER — Telehealth: Payer: PPO | Admitting: Family Medicine

## 2020-08-08 ENCOUNTER — Other Ambulatory Visit: Payer: Self-pay

## 2020-08-08 ENCOUNTER — Encounter: Payer: Self-pay | Admitting: Family Medicine

## 2020-08-08 ENCOUNTER — Ambulatory Visit (INDEPENDENT_AMBULATORY_CARE_PROVIDER_SITE_OTHER): Payer: PPO | Admitting: Family Medicine

## 2020-08-08 DIAGNOSIS — K219 Gastro-esophageal reflux disease without esophagitis: Secondary | ICD-10-CM | POA: Diagnosis not present

## 2020-08-08 MED ORDER — OMEPRAZOLE 20 MG PO CPDR: DELAYED_RELEASE_CAPSULE | ORAL | 0 refills | Status: DC

## 2020-08-08 MED ORDER — FAMOTIDINE 10 MG PO TABS
10.0000 mg | ORAL_TABLET | Freq: Every day | ORAL | Status: DC | PRN
Start: 2020-08-08 — End: 2020-10-03

## 2020-08-08 NOTE — Progress Notes (Signed)
This visit occurred during the SARS-CoV-2 public health emergency.  Safety protocols were in place, including screening questions prior to the visit, additional usage of staff PPE, and extensive cleaning of exam room while observing appropriate contact time as indicated for disinfecting solutions.  He had to stop nortriptyline, he was drowsy, in spite of taking it at night.  Discussed.  He had CT re: suprapubic burning pain.  Benign CT findings d/w pt.  No rectal or backside pain.    He had stopped omeprazole for about 2 weeks.  He was worried about side effects from PPI use.  His brother had Lewy body dementia.  Patient doesn't have memory loss.  He is taking apple cider in the AM as a substitute and trying to put up with GERD sx. in spite of that, he still bothered significantly by GERD symptoms.  He had prev tapered down to 40mg  prilosec a day.  More belching in the meantime with med cessation.  He does not have black or bloody stools.  No hemoptysis.  Not vomiting.  Meds, vitals, and allergies reviewed.   ROS: Per HPI unless specifically indicated in ROS section   GEN: nad, alert and oriented HEENT: ncat NECK: supple w/o LA CV: rrr. PULM: ctab, no inc wob ABD: soft, +bs EXT: no edema SKIN: Well-perfused  30 minutes were devoted to patient care in this encounter (this includes time spent reviewing the patient's file/history, interviewing and examining the patient, counseling/reviewing plan with patient).

## 2020-08-08 NOTE — Patient Instructions (Addendum)
Taper prilosec by 1 pill per week.  Okay to use maalox or pepcid in the meantime.  Update me as needed.  Take care.  Glad to see you.

## 2020-08-10 NOTE — Assessment & Plan Note (Signed)
We talked about options. We talked about slow taper of 20mg  and then he'll update me.  He can try tapering by 1 dose per week.  Can use maalox or pepcid in the meantime if needed.  He was asking about possible SIBO diagnosis.  I would need GI input on that after trial of PPI taper.   I will route this to GI for input and as FYI.  I appreciate the help of all involved.

## 2020-08-28 ENCOUNTER — Ambulatory Visit: Payer: PPO | Admitting: Gastroenterology

## 2020-08-28 ENCOUNTER — Other Ambulatory Visit (INDEPENDENT_AMBULATORY_CARE_PROVIDER_SITE_OTHER): Payer: PPO

## 2020-08-28 ENCOUNTER — Encounter: Payer: Self-pay | Admitting: Gastroenterology

## 2020-08-28 VITALS — BP 140/82 | HR 70 | Ht 66.0 in | Wt 163.0 lb

## 2020-08-28 DIAGNOSIS — K649 Unspecified hemorrhoids: Secondary | ICD-10-CM

## 2020-08-28 DIAGNOSIS — K449 Diaphragmatic hernia without obstruction or gangrene: Secondary | ICD-10-CM | POA: Diagnosis not present

## 2020-08-28 DIAGNOSIS — K219 Gastro-esophageal reflux disease without esophagitis: Secondary | ICD-10-CM

## 2020-08-28 MED ORDER — OMEPRAZOLE 20 MG PO CPDR: 20.0000 mg | DELAYED_RELEASE_CAPSULE | Freq: Two times a day (BID) | ORAL | 5 refills | Status: DC

## 2020-08-28 NOTE — Patient Instructions (Addendum)
If you are age 76 or older, your body mass index should be between 23-30. Your Body mass index is 26.31 kg/m. If this is out of the aforementioned range listed, please consider follow up with your Primary Care Provider.  If you are age 68 or younger, your body mass index should be between 19-25. Your Body mass index is 26.31 kg/m. If this is out of the aformentioned range listed, please consider follow up with your Primary Care Provider.   Please purchase the following medications over the counter and take as directed: Witch Hazel Wipes  We have sent the following medications to your pharmacy for you to pick up at your convenience: Prilosec   Please go to the lab on the 2nd floor [address] before you leave the office today.   Follow up as needed. Next step would be banding.  It was a pleasure to see you today!  Vito Cirigliano, D.O.

## 2020-08-28 NOTE — Progress Notes (Signed)
P  Chief Complaint:    Symptomatic Internal Hemorrhoids, GERD  GI History: 75 year old male follows in the GI clinic for multiple issues as below:  Longstanding history of reflux symptomsfor 50+years, with index symptoms of water brash, regurgitation, and heartburn.Has followed with multiple GI providers at [facility] and [facility]. Has previously trialed AcipHex, omeprazole, Pepcid, Reglan, Carafate, Dexilant, Protonix.Currentlyprescribedomeprazole 40 mg BID,which controls symptoms generally well. Breakthrough symptoms with any missed doses, particularly in the evening/early a.m. Not reliably a/w certain foods. No dysphagia.Is concerned about continuing to take PPI due to fear of ADR profile.Previously discussed antireflux surgical options, but has opted for medical management so far.  Separately, history of nonulcer dyspepsia,which is just as bothersome as typical reflux sxs. Can be soreness, discomfort, and pressurethroughout abdomen, but not frank pain. No hematochezia or melena. Maintians healthy diet and active lifestylewith regular exercise. Drinks plenty of water. Was previously prescribed Pamelor, with improvement in sxs,but he stopped taking and has not resumed.  Previously followed with Dr. Alice Reichert at [facility], last seen in 3/2050forapparent functional dyspepsia and reflux symptoms. At that time, symptoms responded welltonortriptyline 25 mg nightly andcontinued omeprazole.   History ofopencholecystectomy at age 4.  Separately, hec/ochronic constipation. Currently well controlled withMiraLAX.   Endoscopic history: -EGD (07/2018, Dr. Alice Reichert, [place] [facility]): 1 cm HH, Hill grade 2 valve. Biopsies negative for EOE -Colonoscopy (07/2018, Dr. Alice Reichert, [place] [facility]): 1 tubular adenoma -Colonoscopy (10/22/2019, Dr. Bryan Lemma): 2 polyps in sigmoid/descending (path: HP), grade 2 internal hemorrhoids.  Repeat 7 years   -Hydrogen  breath test (05/2018, [facility]): Negative/normal -GES (06/2013): Diminished emptying in the initial 3 hours (0% at 1 hour, 23% at 2 hours, 65% at 3 hours) but 97% emptying at 4 hoursc/wslowed emptying but not frank gastroparesis. -Bravo 48-hour study (12/2011): Significant reflux with134refluxate events, pH<410.3% (normal 2.42%). DeMeester score 46.6(normal <14.7).  -If planning on antireflux surgery, recommend repeat GES  HPI:     Patient is a 75 y.o. male presenting for follow-up.  Last seen by me on 05/16/2020.  CT abdomen/pelvis 05/2020 was unremarkable.  He continues to have intermittent hemorrhoidal symptoms, mainly of rectal burning and itching.  No hematochezia.  He would like to discuss hemorrhoid band ligation today for therapy symptomatic grade 2 hemorrhoids.   For his reflux, he again tried coming off PPI with significant breakthrough despite trialing apple cider vinegar.  Increased heartburn, belching.  Was seen by his PCM on 07/31/2020.  Started Prilosec 20 mg/day again with plan to taper by 1 dose/week and using Pepcid prn breakthrough, but he has had increasing breakthrough with taper. He is again concerned because older brother recently diagnosed with Lewy Body Dementia.   No change in medical or surgical history, medications, allergies, social history since last appointment with me.   Review of systems:     No chest pain, no SOB, no fevers, no urinary sx   Past Medical History:  Diagnosis Date  . Allergic rhinitis   . Anxiety   . Arthritis   . Asthma   . Bell's palsy   . Cataract   . Depression   . Fibromyalgia   . GERD (gastroesophageal reflux disease)    neg path on EGD 06/14/13  . Heart murmur   . Hemorrhoids   . Hiatal hernia    1 cm hill grade 2   . History of migraines   . Hx of adenomatous colonic polyps 07/2002  . Hyperlipidemia   . Hypertension   . IBS (irritable bowel  syndrome)   . Osteoarthritis     Patient's surgical history, family medical  history, social history, medications and allergies were all reviewed in Epic    Current Outpatient Medications  Medication Sig Dispense Refill  . aspirin EC 81 MG tablet Take 81 mg by mouth daily.    . Cholecalciferol 25 MCG (1000 UT) capsule Take 1,000 Units by mouth daily. Take 2 capsules daily.    . famotidine (PEPCID) 10 MG tablet Take 1-2 tablets (10-20 mg total) by mouth daily as needed for heartburn or indigestion.    . Omega-3 Fatty Acids (RA FISH OIL) 1400 MG CPDR Take by mouth daily.    [name] Kitchen omeprazole (PRILOSEC) 20 MG capsule Take 1 pill a day initially.  Then taper by 1 pill per week. 30 capsule 0  . polyethylene glycol (MIRALAX / GLYCOLAX) 17 g packet Take 17 g by mouth daily.    . SUPER B COMPLEX/C PO Take by mouth daily.    [name] Kitchen Ubiquinol 100 MG CAPS Take 100 mg by mouth daily.    . vitamin E 400 UNIT capsule Take 400 Units by mouth daily.     No current facility-administered medications for this visit.    Physical Exam:     There were no vitals taken for this visit.  GENERAL:  Pleasant male in NAD PSYCH: : Cooperative, normal affect NEURO: Alert and oriented x 3, no focal neurologic deficits    IMPRESSION and PLAN:    1) Symptomatic internal hemorrhoids: Discussed potential hemorrhoid banding.  He would like to think about this more and schedule hemorrhoid banding session after he is able to be outlined a little.  In the meantime, continue appropriate conservative management as currently doing. -Patient will call to schedule hemorrhoid banding session  2) GERD 3) Hiatal hernia -We again discussed reflux management at length today.  Discussed use of PPI to include the CT data of this class of medications.  Discussed multiple medication ADRs.  At the end of the discussion, he would like to again resume taking PPI -Restart omeprazole 20 mg PO BID with plan to titrate down as tolerated -While he has clear objective evidence of reflux on prior studies, it seems clinically  to have overlapping hypersensitivity type symptoms, which we have previously discussed at length. -Check annual B12, iron, CBC. BMP is up to date  I spent 35 minutes of time, including in depth chart review, independent review of results as outlined above, communicating results with the patient directly, face-to-face time with the patient, coordinating care, and ordering studies and medications as appropriate, and documentation.       Lavena Bullion ,DO, FACG 08/28/2020, 2:53 PM

## 2020-08-29 LAB — IRON AND TIBC
Iron Saturation: 28 % (ref 15–55)
Iron: 99 ug/dL (ref 38–169)
Total Iron Binding Capacity: 349 ug/dL (ref 250–450)
UIBC: 250 ug/dL (ref 111–343)

## 2020-08-29 LAB — FERRITIN: Ferritin: 74 ng/mL (ref 30–400)

## 2020-08-29 LAB — CBC
Hematocrit: 45.2 % (ref 37.5–51.0)
Hemoglobin: 15.3 g/dL (ref 13.0–17.7)
MCH: 29.7 pg (ref 26.6–33.0)
MCHC: 33.8 g/dL (ref 31.5–35.7)
MCV: 88 fL (ref 79–97)
Platelets: 230 10*3/uL (ref 150–450)
RBC: 5.15 x10E6/uL (ref 4.14–5.80)
RDW: 13.9 % (ref 11.6–15.4)
WBC: 7.2 10*3/uL (ref 3.4–10.8)

## 2020-08-29 LAB — VITAMIN B12: Vitamin B-12: 1099 pg/mL (ref 232–1245)

## 2020-10-03 ENCOUNTER — Encounter: Payer: Self-pay | Admitting: Family Medicine

## 2020-10-03 ENCOUNTER — Other Ambulatory Visit: Payer: Self-pay

## 2020-10-03 ENCOUNTER — Ambulatory Visit (INDEPENDENT_AMBULATORY_CARE_PROVIDER_SITE_OTHER): Payer: PPO | Admitting: Family Medicine

## 2020-10-03 DIAGNOSIS — K219 Gastro-esophageal reflux disease without esophagitis: Secondary | ICD-10-CM | POA: Diagnosis not present

## 2020-10-03 DIAGNOSIS — R21 Rash and other nonspecific skin eruption: Secondary | ICD-10-CM

## 2020-10-03 DIAGNOSIS — K641 Second degree hemorrhoids: Secondary | ICD-10-CM | POA: Diagnosis not present

## 2020-10-03 MED ORDER — CLOTRIMAZOLE-BETAMETHASONE 1-0.05 % EX CREA: 1.0000 "application " | TOPICAL_CREAM | Freq: Two times a day (BID) | CUTANEOUS | 1 refills | Status: DC

## 2020-10-03 MED ORDER — HYDROCORTISONE ACETATE 25 MG RE SUPP: 25.0000 mg | Freq: Two times a day (BID) | RECTAL | 1 refills | Status: DC | PRN

## 2020-10-03 NOTE — Patient Instructions (Signed)
Use lotrisone on the rash and the suppositories for the hemorrhoids.  Update me as needed.  Take care.  Glad to see you.

## 2020-10-03 NOTE — Progress Notes (Signed)
This visit occurred during the SARS-CoV-2 public health emergency.  Safety protocols were in place, including screening questions prior to the visit, additional usage of staff PPE, and extensive cleaning of exam room while observing appropriate contact time as indicated for disinfecting solutions.  GERD.  He tried but couldn't get off PPI, d/w pt.  Sx increased off med.  He restarted PPI in the meantime.  He was going to go to [place] casino for his [place].  The day prior to travel he thought his hemorrhoids were burning/symptomatic.  He decided to go anyway but didn't feel like staying.  Not bulging but burning locally.    He had a rash on the L wrist, looks like small superficial fungal infection.  No blood in stool.  Soft stools.  He has similar rash on his buttocks, per patient report.  Meds, vitals, and allergies reviewed.   ROS: Per HPI unless specifically indicated in ROS section   GEN: nad, alert and oriented HEENT: ncat NECK: supple w/o LA CV: rrr PULM: ctab, no inc wob ABD: soft, +bs EXT: no edema SKIN: rash on the L wrist, looks like small superficial fungal infection. Similar on buttocks B . Rectal inspection w/o gross blood but int hemorrhoid noted at anal verge  Discussed with patient about previous colonoscopy (10/22/2019, Dr. Bryan Lemma): 2 polyps in sigmoid/descending (path: HP), grade 2 internal hemorrhoids.

## 2020-10-05 NOTE — Assessment & Plan Note (Signed)
Discussed options.  Symptomatic burning.  He can try hydrocortisone suppositories and update me as needed.

## 2020-10-05 NOTE — Assessment & Plan Note (Signed)
Reasonable to try Lotrisone on the rash and update me as needed.  He agrees to plan.

## 2020-10-05 NOTE — Assessment & Plan Note (Signed)
He felt worse off PPI and restarted in the meantime.  I think it makes sense to continue for now.  Discussed.

## 2020-12-23 ENCOUNTER — Other Ambulatory Visit: Payer: Self-pay

## 2020-12-23 ENCOUNTER — Ambulatory Visit (INDEPENDENT_AMBULATORY_CARE_PROVIDER_SITE_OTHER): Payer: PPO | Admitting: Family Medicine

## 2020-12-23 ENCOUNTER — Ambulatory Visit (INDEPENDENT_AMBULATORY_CARE_PROVIDER_SITE_OTHER)
Admission: RE | Admit: 2020-12-23 | Discharge: 2020-12-23 | Disposition: A | Discharge status: Home / Self Care | Payer: PPO | Source: Ambulatory Visit | Attending: Family Medicine | Admitting: Family Medicine

## 2020-12-23 ENCOUNTER — Encounter: Payer: Self-pay | Admitting: Family Medicine

## 2020-12-23 VITALS — BP 122/78 | HR 66 | Temp 97.7°F | Ht 66.0 in | Wt 162.0 lb

## 2020-12-23 DIAGNOSIS — R0789 Other chest pain: Secondary | ICD-10-CM

## 2020-12-23 NOTE — Progress Notes (Signed)
This visit occurred during the SARS-CoV-2 public health emergency.  Safety protocols were in place, including screening questions prior to the visit, additional usage of staff PPE, and extensive cleaning of exam room while observing appropriate contact time as indicated for disinfecting solutions.  He is redoing his deck at home.  Fell 3 days ago.  No LOC.  He slipped and went down, hit L elbow and L chest wall.  Was able to get up, said a prayer.  His wife was at home.  He iced it.  L chest wall pain, pain with laugh.  He has h/o GERD/reflux.  He used a TENS type unit on the painful areas.  Taking tylenol and leftover gabapentin.   Meds, vitals, and allergies reviewed.   ROS: Per HPI unless specifically indicated in ROS section   GEN: nad, alert and oriented HEENT: ncat NECK: supple w/o LA CV: rrr.  PULM: ctab, no inc wob, left chest wall tender to palpation without bruising.  Equal breath sounds bilaterally. ABD: soft, +bs EXT: no edema SKIN: Left extensor elbow bruised but not tender to palpation.  Normal left elbow range of motion.

## 2020-12-23 NOTE — Patient Instructions (Signed)
Take gabapentin and tylenol for pain.  Use ice and brace when you take a deep breath.  Take care.  Glad to see you.

## 2020-12-24 NOTE — Assessment & Plan Note (Signed)
See notes on imaging.  Okay for outpatient follow-up. He can take gabapentin and tylenol for pain.  Use ice and brace when taking a deep breath.  He will update me as needed.

## 2021-02-14 IMAGING — CT CT ABD-PELV W/ CM
2 of 5 series · 16 of 46 positions shown, 18 images · IV contrast (OMNIPAQUE)
Comparison: 12/21/2013

CLINICAL DATA: Epigastric pain, scrotal pain, GERD, dyspepsia,
hemorrhoids

EXAM:
CT ABDOMEN AND PELVIS WITH CONTRAST
TECHNIQUE: Multidetector CT imaging of the abdomen and pelvis was performed
using the standard protocol following bolus administration of
intravenous contrast.
CONTRAST:  100mL OMNIPAQUE IOHEXOL 300 MG/ML SOLN, additional oral
enteric contrast

[Series 2: axial st · axial · 0.74mm/px · z∈[-540,-150]mm · 13 of 92 slices shown, 15 images]
[im 7/92  soft-tissue]
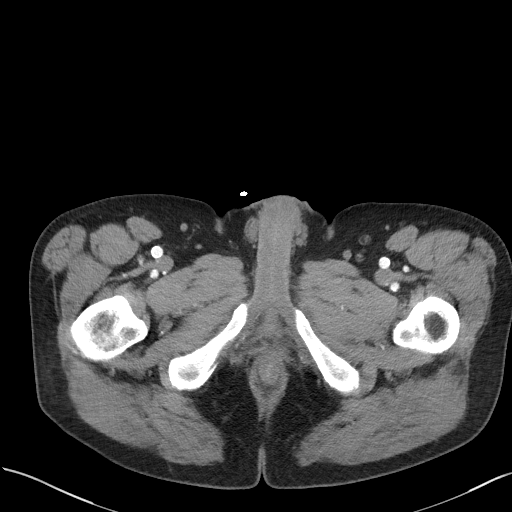
[im 7/92  bone]
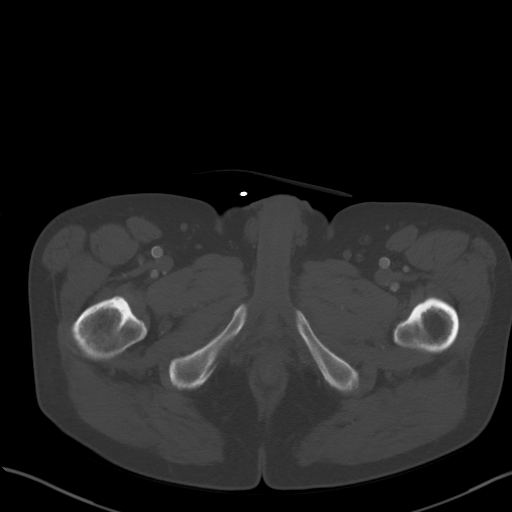
[im 14/92  soft-tissue]
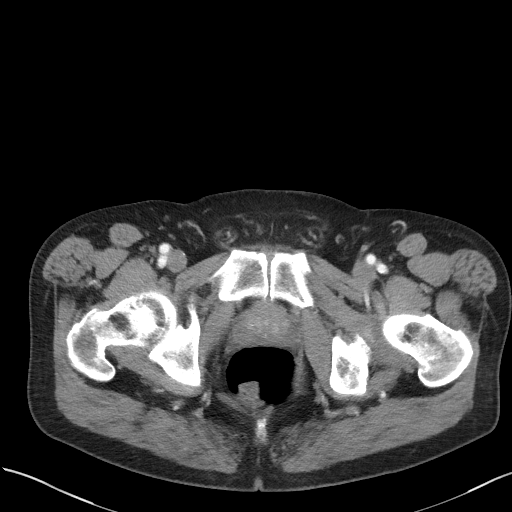
[im 20/92  soft-tissue]
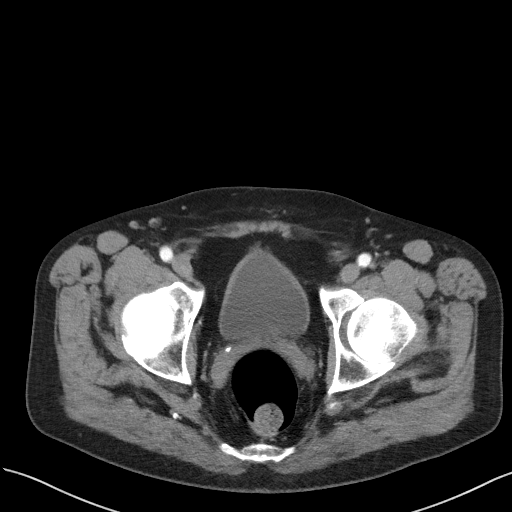
[im 27/92  soft-tissue]
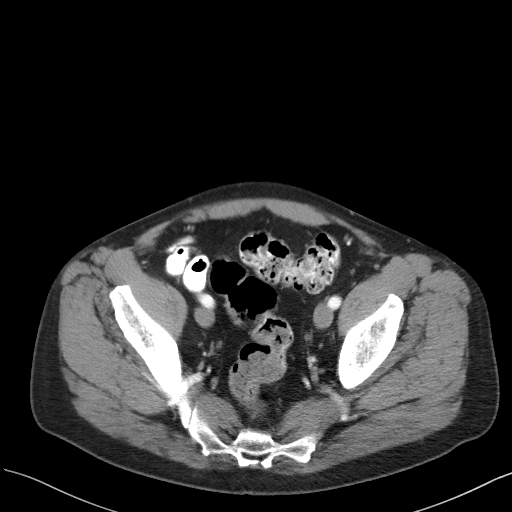
[im 33/92  soft-tissue]
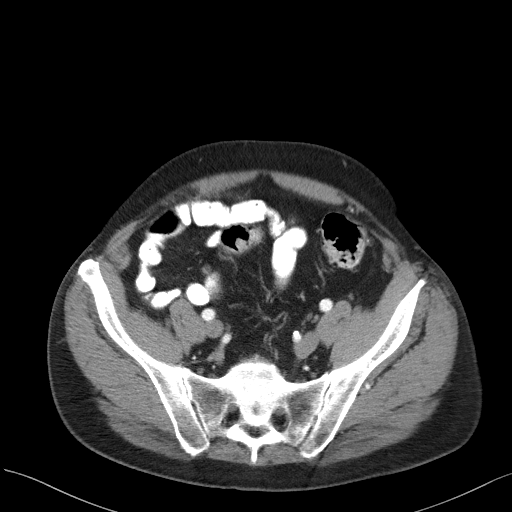
[im 40/92  soft-tissue]
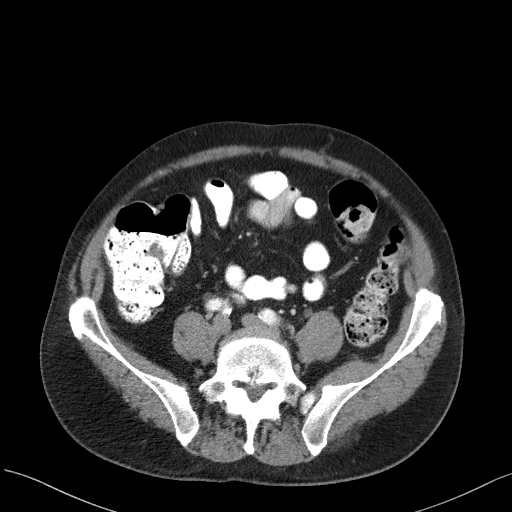
[im 46/92  soft-tissue]
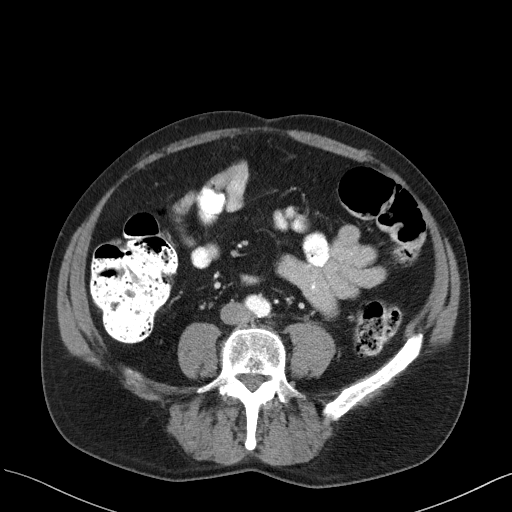
[im 53/92  soft-tissue]
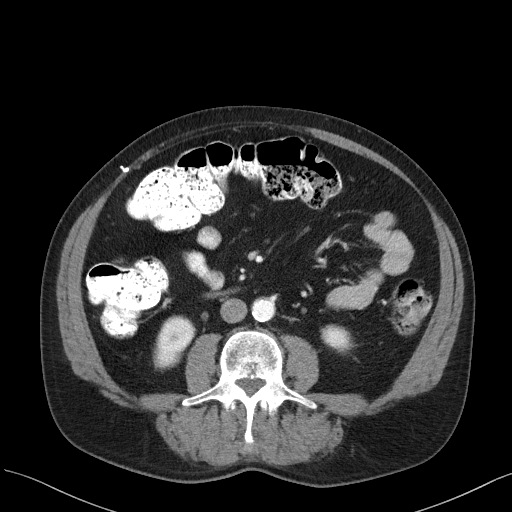
[im 59/92  soft-tissue]
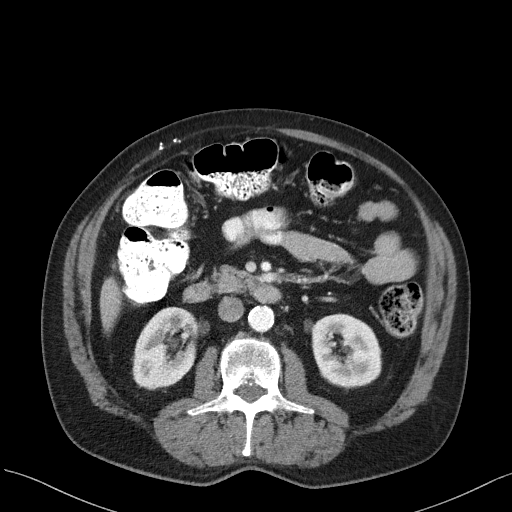
[im 59/92  bone]
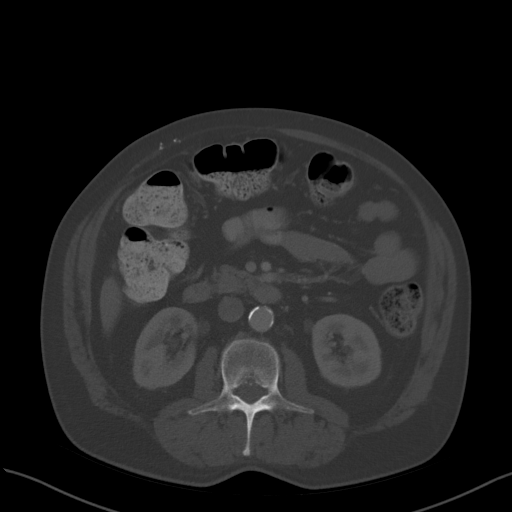
[im 66/92  soft-tissue]
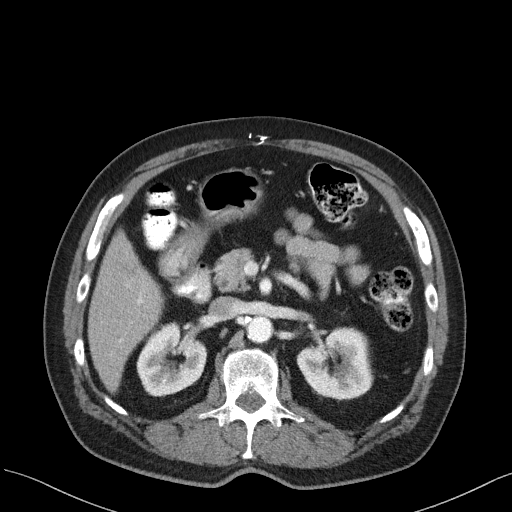
[im 72/92  soft-tissue]
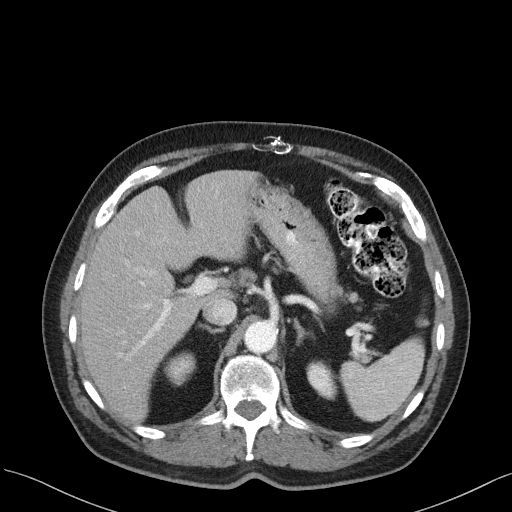
[im 79/92  soft-tissue]
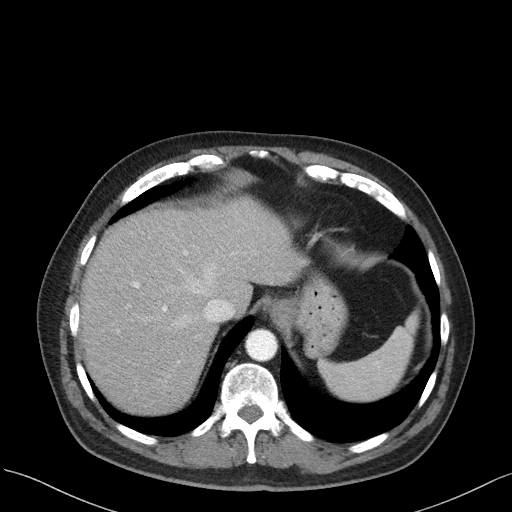
[im 85/92  soft-tissue]
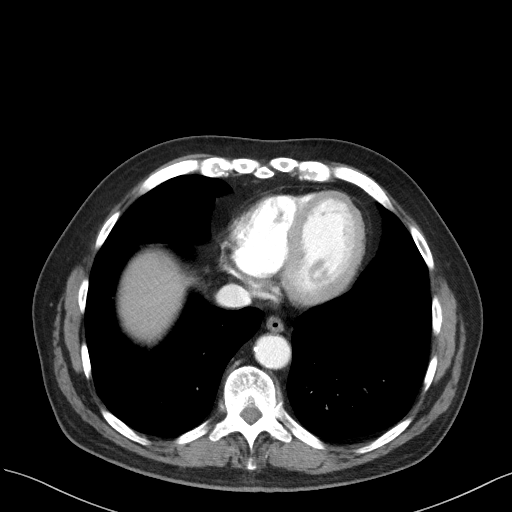

[Series 4: coronal st · coronal · 0.96mm/px · 3 of 88 slices shown]
[im 30/88  soft-tissue]
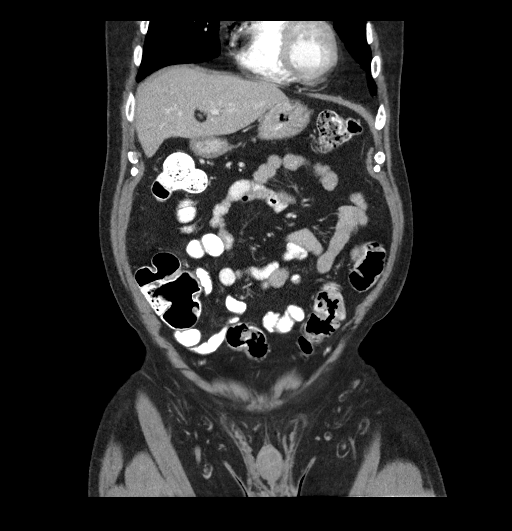
[im 39/88  soft-tissue]
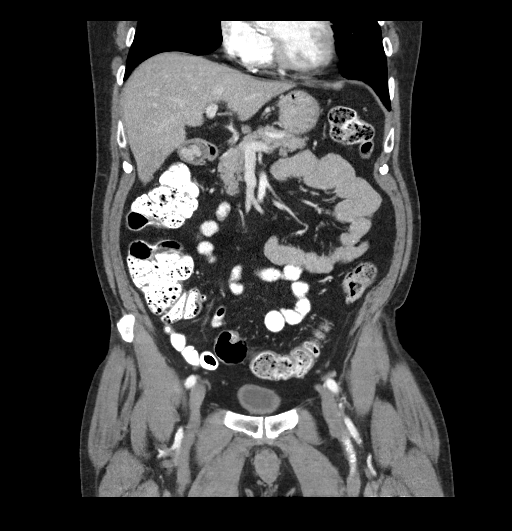
[im 49/88  soft-tissue]
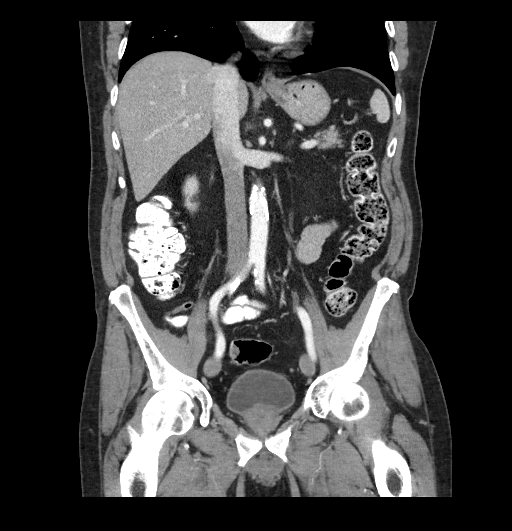

[16 of 46 positions shown; findings below may reference images not displayed]

FINDINGS: Lower chest: No acute abnormality. Stable, definitively benign small
pulmonary nodule of the subpleural left lower lobe (series 6, image
5).

Hepatobiliary: No focal liver abnormality is seen. Status post
cholecystectomy. No biliary dilatation.

Pancreas: Unremarkable. No pancreatic ductal dilatation or
surrounding inflammatory changes.

Spleen: Normal in size without significant abnormality.

Adrenals/Urinary Tract: Adrenal glands are unremarkable. Kidneys are
normal, without renal calculi, solid lesion, or hydronephrosis.
Bladder is unremarkable.

Stomach/Bowel: Stomach is within normal limits. Appendix appears
normal. No evidence of bowel wall thickening, distention, or
inflammatory changes.

Vascular/Lymphatic: Aortic atherosclerosis. No enlarged abdominal or
pelvic lymph nodes.

Reproductive: No mass or other significant abnormality.

Other: No abdominal wall hernia or abnormality. No abdominopelvic
ascites.

Musculoskeletal: No acute or significant osseous findings.
IMPRESSION: 1. No acute CT findings of the abdomen or pelvis to explain pain.
2. Status post cholecystectomy.

Aortic Atherosclerosis ([id number]).

## 2021-03-26 DIAGNOSIS — H353131 Nonexudative age-related macular degeneration, bilateral, early dry stage: Secondary | ICD-10-CM | POA: Diagnosis not present

## 2021-03-26 DIAGNOSIS — H5201 Hypermetropia, right eye: Secondary | ICD-10-CM | POA: Diagnosis not present

## 2021-03-26 DIAGNOSIS — H01024 Squamous blepharitis left upper eyelid: Secondary | ICD-10-CM | POA: Diagnosis not present

## 2021-03-26 DIAGNOSIS — H01021 Squamous blepharitis right upper eyelid: Secondary | ICD-10-CM | POA: Diagnosis not present

## 2021-03-26 DIAGNOSIS — Z961 Presence of intraocular lens: Secondary | ICD-10-CM | POA: Diagnosis not present

## 2021-03-26 DIAGNOSIS — H52221 Regular astigmatism, right eye: Secondary | ICD-10-CM | POA: Diagnosis not present

## 2021-05-07 ENCOUNTER — Encounter: Payer: Self-pay | Admitting: Family Medicine

## 2021-05-07 ENCOUNTER — Ambulatory Visit (INDEPENDENT_AMBULATORY_CARE_PROVIDER_SITE_OTHER): Payer: PPO | Admitting: Family Medicine

## 2021-05-07 ENCOUNTER — Other Ambulatory Visit: Payer: Self-pay

## 2021-05-07 VITALS — BP 122/80 | HR 68 | Temp 97.8°F | Ht 66.0 in | Wt 162.0 lb

## 2021-05-07 DIAGNOSIS — E785 Hyperlipidemia, unspecified: Secondary | ICD-10-CM | POA: Diagnosis not present

## 2021-05-07 DIAGNOSIS — Z125 Encounter for screening for malignant neoplasm of prostate: Secondary | ICD-10-CM

## 2021-05-07 DIAGNOSIS — Z23 Encounter for immunization: Secondary | ICD-10-CM

## 2021-05-07 DIAGNOSIS — Z0000 Encounter for general adult medical examination without abnormal findings: Secondary | ICD-10-CM

## 2021-05-07 DIAGNOSIS — R21 Rash and other nonspecific skin eruption: Secondary | ICD-10-CM

## 2021-05-07 DIAGNOSIS — K219 Gastro-esophageal reflux disease without esophagitis: Secondary | ICD-10-CM | POA: Diagnosis not present

## 2021-05-07 DIAGNOSIS — Z7189 Other specified counseling: Secondary | ICD-10-CM

## 2021-05-07 LAB — COMPREHENSIVE METABOLIC PANEL
ALT: 23 U/L (ref 0–53)
AST: 25 U/L (ref 0–37)
Albumin: 4.5 g/dL (ref 3.5–5.2)
Alkaline Phosphatase: 72 U/L (ref 39–117)
BUN: 18 mg/dL (ref 6–23)
CO2: 33 mEq/L — ABNORMAL HIGH (ref 19–32)
Calcium: 10 mg/dL (ref 8.4–10.5)
Chloride: 103 mEq/L (ref 96–112)
Creatinine, Ser: 0.81 mg/dL (ref 0.40–1.50)
GFR: 85.59 mL/min (ref >60.00)
Glucose, Bld: 96 mg/dL (ref 70–99)
Potassium: 5.2 mEq/L — ABNORMAL HIGH (ref 3.5–5.1)
Sodium: 141 mEq/L (ref 135–145)
Total Bilirubin: 0.5 mg/dL (ref 0.2–1.2)
Total Protein: 7.3 g/dL (ref 6.0–8.3)

## 2021-05-07 LAB — CBC WITH DIFFERENTIAL/PLATELET
Basophils Absolute: 0.1 10*3/uL (ref 0.0–0.1)
Basophils Relative: 2 % (ref 0.0–3.0)
Eosinophils Absolute: 0.5 10*3/uL (ref 0.0–0.7)
Eosinophils Relative: 7.7 % — ABNORMAL HIGH (ref 0.0–5.0)
HCT: 44.8 % (ref 39.0–52.0)
Hemoglobin: 14.7 g/dL (ref 13.0–17.0)
Lymphocytes Relative: 24.8 % (ref 12.0–46.0)
Lymphs Abs: 1.6 10*3/uL (ref 0.7–4.0)
MCHC: 32.8 g/dL (ref 30.0–36.0)
MCV: 89 fl (ref 78.0–100.0)
Monocytes Absolute: 0.7 10*3/uL (ref 0.1–1.0)
Monocytes Relative: 11.1 % (ref 3.0–12.0)
Neutro Abs: 3.5 10*3/uL (ref 1.4–7.7)
Neutrophils Relative %: 54.4 % (ref 43.0–77.0)
Platelets: 217 10*3/uL (ref 150.0–400.0)
RBC: 5.03 Mil/uL (ref 4.22–5.81)
RDW: 15.4 % (ref 11.5–15.5)
WBC: 6.4 10*3/uL (ref 4.0–10.5)

## 2021-05-07 LAB — LIPID PANEL
Cholesterol: 223 mg/dL — ABNORMAL HIGH (ref 0–200)
HDL: 53 mg/dL (ref >39.00)
LDL Cholesterol: 144 mg/dL — ABNORMAL HIGH (ref 0–99)
NonHDL: 170.31
Total CHOL/HDL Ratio: 4
Triglycerides: 133 mg/dL (ref 0.0–149.0)
VLDL: 26.6 mg/dL (ref 0.0–40.0)

## 2021-05-07 LAB — PSA, MEDICARE: PSA: 1.89 ng/ml (ref 0.10–4.00)

## 2021-05-07 MED ORDER — CLOBETASOL PROPIONATE 0.05 % EX CREA: 1.0000 "application " | TOPICAL_CREAM | Freq: Two times a day (BID) | CUTANEOUS | 1 refills | Status: AC | PRN

## 2021-05-07 NOTE — Patient Instructions (Signed)
Go to the lab on the way out.   If you have mychart we'll likely use that to update you.    Take care.  Glad to see you. Use the cream and see if that helps. Either way, let me know.

## 2021-05-07 NOTE — Progress Notes (Signed)
This visit occurred during the SARS-CoV-2 public health emergency.  Safety protocols were in place, including screening questions prior to the visit, additional usage of staff PPE, and extensive cleaning of exam room while observing appropriate contact time as indicated for disinfecting solutions.  I have personally reviewed the Medicare Annual Wellness questionnaire and have noted 1. The patient's medical and social history 2. Their use of alcohol, tobacco or illicit drugs 3. Their current medications and supplements 4. The patient's functional ability including ADL's, fall risks, home safety risks and hearing or visual             impairment. 5. Diet and physical activities 6. Evidence for depression or mood disorders  The patients weight, height, BMI have been recorded in the chart and visual acuity is per eye clinic.  I have made referrals, counseling and provided education to the patient based review of the above and I have provided the pt with a written personalized care plan for preventive services.  Provider list updated- see scanned forms.  Routine anticipatory guidance given to patient.  See health maintenance. The possibility exists that previously documented standard health maintenance information may have been brought forward from a previous encounter into this note.  If needed, that same information has been updated to reflect the current situation based on today's encounter.    Flu up-to-date Shingles up-to-date PNA up-to-date Tetanus 2006, discussed with patient.  It may be cheaper the pharmacy. COVID-vaccine previously done Colonoscopy 2021 Prostate cancer screening pending 2022 Advance directive-wife designated if patient were incapacitated. Cognitive function addressed- see scanned forms- and if abnormal then additional documentation follows.   In addition to [facility], follow up visit for the below conditions:  GERD.  He slowly tapered off prilosec.  He is  taking apple cider vinegar in the meantime, prior to meals.  He is going well with that.  He has gone 7 weeks off PPI.    He felt better off statin.  He is taking RYR and tolerating that.  F/u labs pending.    Itchy rash on the R ankle, L forearm.  Lotrisone didn't help.  Lesions tend to wax and wane.  No diet changes to explain the sx.    PMH and SH reviewed  Meds, vitals, and allergies reviewed.   ROS: Per HPI.  Unless specifically indicated otherwise in HPI, the patient denies:  General: fever. Eyes: acute vision changes ENT: sore throat Cardiovascular: chest pain Respiratory: SOB GI: vomiting GU: dysuria Musculoskeletal: acute back pain Derm: acute rash Neuro: acute motor dysfunction Psych: worsening mood Endocrine: polydipsia Heme: bleeding Allergy: hayfever  GEN: nad, alert and oriented HEENT: ncat NECK: supple w/o LA CV: rrr. PULM: ctab, no inc wob ABD: soft, +bs EXT: no edema SKIN: Faint rash that blanches noted on the right ankle.  No ulceration.

## 2021-05-08 NOTE — Telephone Encounter (Signed)
Please check with his pharmacy and see if they have any high potency topical steroid cream that is less expensive.  Please let me know.  Thanks.

## 2021-05-11 NOTE — Assessment & Plan Note (Signed)
He slowly tapered off prilosec.  He is taking apple cider vinegar in the meantime, prior to meals.  He is going well with that.  He has gone 7 weeks off PPI.  He will continue off PPI for now and update me as needed.

## 2021-05-11 NOTE — Assessment & Plan Note (Addendum)
Flu up-to-date Shingles up-to-date PNA up-to-date Tetanus 2006, discussed with patient.  It may be cheaper the pharmacy. COVID-vaccine previously done Colonoscopy 2021 Prostate cancer screening pending 2022 Advance directive-wife designated if patient were incapacitated. Cognitive function addressed- see scanned forms- and if abnormal then additional documentation follows.

## 2021-05-11 NOTE — Assessment & Plan Note (Signed)
Discussed options.  Reasonable to try clobetasol.  This could be an eczema variant.  Does not appear infectious.  I asked him to let me know if clobetasol was prohibitively expensive.  Routine steroid cautions discussed with patient.

## 2021-05-11 NOTE — Assessment & Plan Note (Signed)
He felt better off statin.  He is taking RYR and tolerating that.  F/u labs pending.  See notes on labs.  Continue work on diet and exercise.

## 2021-05-11 NOTE — Assessment & Plan Note (Addendum)
Advance directive- wife designated if patient were incapacitated.  

## 2021-05-15 ENCOUNTER — Ambulatory Visit: Payer: PPO

## 2021-09-09 ENCOUNTER — Other Ambulatory Visit: Payer: Self-pay | Admitting: Gastroenterology

## 2021-10-13 ENCOUNTER — Ambulatory Visit (INDEPENDENT_AMBULATORY_CARE_PROVIDER_SITE_OTHER): Payer: PPO | Admitting: Nurse Practitioner

## 2021-10-13 ENCOUNTER — Encounter: Payer: Self-pay | Admitting: Nurse Practitioner

## 2021-10-13 VITALS — BP 148/98 | HR 66 | Temp 97.6°F | Resp 14 | Ht 66.0 in | Wt 162.1 lb

## 2021-10-13 DIAGNOSIS — F419 Anxiety disorder, unspecified: Secondary | ICD-10-CM

## 2021-10-13 DIAGNOSIS — H6192 Disorder of left external ear, unspecified: Secondary | ICD-10-CM | POA: Insufficient documentation

## 2021-10-13 LAB — COMPREHENSIVE METABOLIC PANEL
ALT: 20 U/L (ref 0–53)
AST: 22 U/L (ref 0–37)
Albumin: 4.6 g/dL (ref 3.5–5.2)
Alkaline Phosphatase: 64 U/L (ref 39–117)
BUN: 15 mg/dL (ref 6–23)
CO2: 32 mEq/L (ref 19–32)
Calcium: 9.8 mg/dL (ref 8.4–10.5)
Chloride: 103 mEq/L (ref 96–112)
Creatinine, Ser: 0.84 mg/dL (ref 0.40–1.50)
GFR: 84.39 mL/min (ref >60.00)
Glucose, Bld: 94 mg/dL (ref 70–99)
Potassium: 4.5 mEq/L (ref 3.5–5.1)
Sodium: 140 mEq/L (ref 135–145)
Total Bilirubin: 0.6 mg/dL (ref 0.2–1.2)
Total Protein: 7 g/dL (ref 6.0–8.3)

## 2021-10-13 LAB — CBC
HCT: 43.3 % (ref 39.0–52.0)
Hemoglobin: 14.7 g/dL (ref 13.0–17.0)
MCHC: 34.1 g/dL (ref 30.0–36.0)
MCV: 89.2 fl (ref 78.0–100.0)
Platelets: 212 10*3/uL (ref 150.0–400.0)
RBC: 4.85 Mil/uL (ref 4.22–5.81)
RDW: 14.2 % (ref 11.5–15.5)
WBC: 5.1 10*3/uL (ref 4.0–10.5)

## 2021-10-13 LAB — TSH: TSH: 2.63 u[IU]/mL (ref 0.35–5.50)

## 2021-10-13 NOTE — Assessment & Plan Note (Signed)
Patient has what appears to be skin tag-like skin lesion to left external ear.  Feels more rigid and immobile and facial skin tag.  Has been going on for 2 months getting bigger and size per patient report.  We will refer the to dermatology.  Ambulatory referral to dermatology placed ?

## 2021-10-13 NOTE — Progress Notes (Signed)
? ?Acute Office Visit ? ?Subjective:  ? ? Patient ID: Kyle Sanchez, male    DOB: [birth year], 76 y.o.   MRN: [id number] ? ?Chief Complaint  ?Patient presents with  ? spot in the left ear  ?  Has a spot like a "boil" on the inside of the left ear that is tender/sore. X 2 months. Has gotten bigger.  ? ? ?HPI ?Patient is in today for left ear pain/growth ? ?States that he noticed it over the past two months ?Started out small and has been growing. Hurts to touch. ?No drainage out of the ear. States that in the morning and a night they will use a vitamin E oil. If they do not do that it will have a white curst. ? ?Depression: states that over the past 4 months he has been stressed and feeling down. States they normal go to bed around 12 and he will get up aroun130 to pee but takes longer to go to sleep again. He will wake up around 4-5 oclock and he has the  ? ? ?  10/13/2021  ?  9:43 AM 05/12/2020  ? 10:35 AM 04/21/2018  ? 11:06 AM  ?PHQ9 SCORE ONLY  ?PHQ-9 Total Score 5 0 0  ?  ? ?  10/13/2021  ?  9:43 AM  ?GAD 7 : Generalized Anxiety Score  ?Nervous, Anxious, on Edge 0  ?Control/stop worrying 1  ?Worry too much - different things 1  ?Trouble relaxing 1  ?Restless 0  ?Easily annoyed or irritable 0  ?Afraid - awful might happen 0  ?Total GAD 7 Score 3  ?Anxiety Difficulty Not difficult at all  ? ?  ? ?Past Medical History:  ?Diagnosis Date  ? Allergic rhinitis   ? Anxiety   ? Arthritis   ? Asthma   ? Bell's palsy   ? Cataract   ? Depression   ? GERD (gastroesophageal reflux disease)   ? neg path on EGD 06/14/13  ? Heart murmur   ? Hemorrhoids   ? Hiatal hernia   ? 1 cm hill grade 2   ? History of migraines   ? Hx of adenomatous colonic polyps 07/2002  ? Hyperlipidemia   ? Hypertension   ? IBS (irritable bowel syndrome)   ? Osteoarthritis   ? ? ?Past Surgical History:  ?Procedure Laterality Date  ? CATARACT EXTRACTION, BILATERAL  2021  ? CHOLECYSTECTOMY    ? colonoscopy and endoscopy    ? COLONOSCOPY WITH PROPOFOL N/A 07/26/2018   ? Procedure: COLONOSCOPY WITH PROPOFOL;  Surgeon: Toledo, Benay Pike, MD;  Location: [facility] ENDOSCOPY;  Service: Gastroenterology;  Laterality: N/A;  ? ESOPHAGOGASTRODUODENOSCOPY (EGD) WITH PROPOFOL N/A 07/26/2018  ? Procedure: ESOPHAGOGASTRODUODENOSCOPY (EGD) WITH PROPOFOL;  Surgeon: Toledo, Benay Pike, MD;  Location: [facility] ENDOSCOPY;  Service: Gastroenterology;  Laterality: N/A;  ? ? ?Family History  ?Problem Relation Age of Onset  ? Nephrolithiasis Mother   ? Arthritis Mother   ? Stroke Mother   ? Alcohol abuse Father   ? Mental illness Sister   ? Prostate cancer Brother   ? Colon polyps Brother   ? Colon cancer Neg Hx   ? Esophageal cancer Neg Hx   ? ? ?Social History  ? ?Socioeconomic History  ? Marital status: Married  ?  Spouse name: Not on file  ? Number of children: 2  ? Years of education: Not on file  ? Highest education level: Not on file  ?Occupational History  ? 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  Employer: retired  ?Tobacco Use  ? Smoking status: Former  ? Smokeless tobacco: Never  ? Tobacco comments:  ?  Stopped 30 years ago   ?Vaping Use  ? Vaping Use: Never used  ?Substance and Sexual Activity  ? Alcohol use: Never  ?  Alcohol/week: 0.0 standard drinks  ? Drug use: No  ? Sexual activity: Yes  ?  Partners: Female  ?Other Topics Concern  ? Not on file  ?Social History Narrative  ? From [place], [place] since 1995  ? Married 1966  ? ?Social Determinants of Health  ? ?Financial Resource Strain: Not on file  ?Food Insecurity: Not on file  ?Transportation Needs: Not on file  ?Physical Activity: Not on file  ?Stress: Not on file  ?Social Connections: Not on file  ?Intimate Partner Violence: Not on file  ? ? ?Outpatient Medications Prior to Visit  ?Medication Sig Dispense Refill  ? aspirin EC 81 MG tablet Take 81 mg by mouth daily.    ? Cholecalciferol 25 MCG (1000 UT) capsule Take 1,000 Units by mouth daily. Take 2 capsules daily.    ? clobetasol cream (TEMOVATE) 8.41 % Apply 1 application  topically 2 (two) times daily as needed. 30 g 1  ? Omega-3 Fatty Acids (RA FISH OIL) 1400 MG CPDR Take by mouth daily.    ? omeprazole (PRILOSEC) 20 MG capsule Take 1 capsule by mouth twice daily 60 capsule 0  ? polyethylene glycol (MIRALAX / GLYCOLAX) 17 g packet Take 17 g by mouth daily.    ? Red Yeast Rice Extract (RED YEAST RICE PO) Take 1 tablet by mouth daily.    ? SUPER B COMPLEX/C PO Take by mouth daily.    ? Ubiquinol 100 MG CAPS Take 100 mg by mouth daily.    ? vitamin E 400 UNIT capsule Take 400 Units by mouth daily.    ? ?No facility-administered medications prior to visit.  ? ? ?Allergies  ?Allergen Reactions  ? Nortriptyline   ?  sedation  ? Simvastatin   ?  REACTION: myalgias  ? Sucralfate   ?  Dry mouth, aches  ? ? ?Review of Systems  ?Constitutional:  Negative for chills and fever.  ?HENT:  Positive for ear pain. Negative for ear discharge.   ?Respiratory:  Negative for cough and shortness of breath.   ?Cardiovascular:  Negative for chest pain.  ?Neurological:  Positive for headaches.  ?Psychiatric/Behavioral:  Negative for hallucinations and suicidal ideas.   ? ?   ?Objective:  ?  ?Physical Exam ?Vitals and nursing note reviewed.  ?Constitutional:   ?   Appearance: Normal appearance.  ?HENT:  ?   Right Ear: Tympanic membrane, ear canal and external ear normal.  ?   Left Ear: Tympanic membrane and ear canal normal.  ?Cardiovascular:  ?   Rate and Rhythm: Normal rate and regular rhythm.  ?   Heart sounds: Normal heart sounds.  ?Pulmonary:  ?   Breath sounds: Normal breath sounds.  ?Lymphadenopathy:  ?   Cervical: No cervical adenopathy.  ?Skin: ?   General: Skin is warm.  ?   Findings: Lesion present.  ?Neurological:  ?   Mental Status: He is alert.  ?Psychiatric:     ?   Mood and Affect: Mood normal.     ?   Behavior: Behavior normal.     ?   Thought Content: Thought content normal.     ?   Judgment: Judgment  normal.  ? ? ? ?BP (!) 148/98   Pulse 66   Temp 97.6 ?F (36.4 ?C)   Resp 14   Ht '5\' 6"'$   (1.676 m)   Wt 162 lb 2 oz (73.5 kg)   SpO2 98%   BMI 26.17 kg/m?  ?Wt Readings from Last 3 Encounters:  ?10/13/21 162 lb 2 oz (73.5 kg)  ?05/07/21 162 lb (73.5 kg)  ?12/23/20 162 lb (73.5 kg)  ? ? ?Health Maintenance Due  ?Topic Date Due  ? TETANUS/TDAP  06/12/2015  ? COVID-19 Vaccine (4 - Booster for Pfizer series) 07/15/2020  ? ? ?There are no preventive care reminders to display for this patient. ? ? ?Lab Results  ?Component Value Date  ? TSH 1.27 03/04/2009  ? ?Lab Results  ?Component Value Date  ? WBC 6.4 05/07/2021  ? HGB 14.7 05/07/2021  ? HCT 44.8 05/07/2021  ? MCV 89.0 05/07/2021  ? PLT 217.0 05/07/2021  ? ?Lab Results  ?Component Value Date  ? NA 141 05/07/2021  ? K 5.2 No hemolysis seen (H) 05/07/2021  ? CO2 33 (H) 05/07/2021  ? GLUCOSE 96 05/07/2021  ? BUN 18 05/07/2021  ? CREATININE 0.81 05/07/2021  ? BILITOT 0.5 05/07/2021  ? ALKPHOS 72 05/07/2021  ? AST 25 05/07/2021  ? ALT 23 05/07/2021  ? PROT 7.3 05/07/2021  ? ALBUMIN 4.5 05/07/2021  ? CALCIUM 10.0 05/07/2021  ? GFR 85.59 05/07/2021  ? ?Lab Results  ?Component Value Date  ? CHOL 223 (H) 05/07/2021  ? ?Lab Results  ?Component Value Date  ? HDL 53.00 05/07/2021  ? ?Lab Results  ?Component Value Date  ? LDLCALC 144 (H) 05/07/2021  ? ?Lab Results  ?Component Value Date  ? TRIG 133.0 05/07/2021  ? ?Lab Results  ?Component Value Date  ? CHOLHDL 4 05/07/2021  ? ?No results found for: HGBA1C ? ?   ?Assessment & Plan:  ? ?Problem List Items Addressed This Visit   ? ?  ? Nervous and Auditory  ? Skin lesion of left external ear - Primary  ?  Patient has what appears to be skin tag-like skin lesion to left external ear.  Feels more rigid and immobile and facial skin tag.  Has been going on for 2 months getting bigger and size per patient report.  We will refer the to dermatology.  Ambulatory referral to dermatology placed ?  ?  ? Relevant Orders  ? Ambulatory referral to Dermatology  ?  ? Other  ? Anxiety  ?  pHQ 9 GAD-7 administered in office.  Offered  patient therapy referral, he declined.  He is not interested in medication at this current time.  Follow-up if no improvement ?  ?  ? Relevant Orders  ? CBC  ? TSH  ? Comprehensive metabolic panel  ? ? ? ?No orders

## 2021-10-13 NOTE — Assessment & Plan Note (Signed)
pHQ 9 GAD-7 administered in office.  Offered patient therapy referral, he declined.  He is not interested in medication at this current time.  Follow-up if no improvement ?

## 2021-10-13 NOTE — Patient Instructions (Addendum)
Nice to see you today ?I will be in touch with the lab results ?I referred you to dermatology in [place]  ?Follow up with Dr. Damita Dunnings if symptoms do not improve ?

## 2021-11-06 ENCOUNTER — Encounter: Payer: Self-pay | Admitting: Family Medicine

## 2021-11-06 ENCOUNTER — Ambulatory Visit: Payer: PPO | Admitting: Family Medicine

## 2021-11-06 DIAGNOSIS — H6192 Disorder of left external ear, unspecified: Secondary | ICD-10-CM

## 2021-11-06 NOTE — Progress Notes (Signed)
His brother has Lewy body dementia, d/w pt.   ? ?1cm by 1cm lesion on the R temple.  More irritated and enlarging recently.  It is more prominent.  No bleeding.   ? ?Irritated lesion L pinna, <1cm.   ? ?He has dermatology appointment with Dr. Nehemiah Massed but not until 04/2022.   ? ?Meds, vitals, and allergies reviewed.  ? ?ROS: Per HPI unless specifically indicated in ROS section  ? ?Nad ?Ncat ?He has a benign appearing superior keratosis noted on the right temple, 1 cm by 1 cm.  No ulceration. ?He has an irritated lesion in the left pinna. ?

## 2021-11-06 NOTE — Patient Instructions (Signed)
No charge for visit.  ?Let me see about getting the dermatology appointment moved sooner.  ?The spot on your temple is likely a benign seborrheic keratosis.  I want them to check that spot and the spot in your ear.  ?Take care.  Glad to see you. ?

## 2021-11-08 ENCOUNTER — Telehealth: Payer: Self-pay | Admitting: Family Medicine

## 2021-11-08 NOTE — Assessment & Plan Note (Addendum)
Along with what appears to be a seborrheic keratosis near the right temple.  We will see about getting his dermatology appointment moved sooner, as I am concerned that he could potentially have a squamous cell cancer of the left pinna.  No intervention thus no charge for visit. ?

## 2021-11-08 NOTE — Telephone Encounter (Signed)
He has an appointment with Dr. Nehemiah Massed with dermatology but it is not until October. He had seen Dr. Montez Hageman.  I am concerned mainly about the lesion on his left ear, please see my most recent office visit note.  I routed this to both [place] and  [place].  Please see about getting his appointment moved sooner.  Thanks.  ?

## 2021-11-10 NOTE — Telephone Encounter (Signed)
Many thanks. 

## 2021-11-10 NOTE — Telephone Encounter (Addendum)
Dr Damita Dunnings,  ? ?Soonest appt available is 12/23/21 ?Pt rescheduled to sooner appt.  ? ?Date: 12/23/2021 Status: Sch  ?Time: 12:00 PM Length: 15  ?Visit Type: NEW PATIENT [[id number]]    ?Provider: Ralene Bathe, MD Department: [place]  ?Referring Provider: Tonia Ghent CSN: [id number]  ?Notes: skin lesion  ? ? ?Pt has been added to their cancellation list for sooner appt. I have sent a mychart message to the patient with the new appt information. He will also be notified via mychart of the new appt.  ?

## 2021-11-10 NOTE — Telephone Encounter (Signed)
Msg sent to [facility] to see if able to work patient in sooner with Dr Laurence Ferrari or another Provider.  ? ?

## 2021-12-23 ENCOUNTER — Ambulatory Visit: Payer: PPO | Admitting: Dermatology

## 2022-04-06 ENCOUNTER — Other Ambulatory Visit: Payer: Self-pay | Admitting: Gastroenterology

## 2022-04-14 ENCOUNTER — Ambulatory Visit: Payer: PPO | Admitting: Dermatology

## 2022-04-19 ENCOUNTER — Ambulatory Visit: Payer: PPO | Admitting: Dermatology

## 2022-04-20 DIAGNOSIS — H353131 Nonexudative age-related macular degeneration, bilateral, early dry stage: Secondary | ICD-10-CM | POA: Diagnosis not present

## 2022-04-20 DIAGNOSIS — H01021 Squamous blepharitis right upper eyelid: Secondary | ICD-10-CM | POA: Diagnosis not present

## 2022-04-20 DIAGNOSIS — H5201 Hypermetropia, right eye: Secondary | ICD-10-CM | POA: Diagnosis not present

## 2022-04-20 DIAGNOSIS — H52221 Regular astigmatism, right eye: Secondary | ICD-10-CM | POA: Diagnosis not present

## 2022-04-20 DIAGNOSIS — H01024 Squamous blepharitis left upper eyelid: Secondary | ICD-10-CM | POA: Diagnosis not present

## 2022-04-25 ENCOUNTER — Other Ambulatory Visit: Payer: Self-pay | Admitting: Family Medicine

## 2022-04-25 DIAGNOSIS — Z125 Encounter for screening for malignant neoplasm of prostate: Secondary | ICD-10-CM

## 2022-04-25 DIAGNOSIS — E785 Hyperlipidemia, unspecified: Secondary | ICD-10-CM

## 2022-05-04 ENCOUNTER — Other Ambulatory Visit (INDEPENDENT_AMBULATORY_CARE_PROVIDER_SITE_OTHER): Payer: PPO

## 2022-05-04 DIAGNOSIS — Z125 Encounter for screening for malignant neoplasm of prostate: Secondary | ICD-10-CM

## 2022-05-04 DIAGNOSIS — E785 Hyperlipidemia, unspecified: Secondary | ICD-10-CM | POA: Diagnosis not present

## 2022-05-04 LAB — PSA, MEDICARE: PSA: 2.35 ng/ml (ref 0.10–4.00)

## 2022-05-04 LAB — CBC WITH DIFFERENTIAL/PLATELET
Basophils Absolute: 0.1 10*3/uL (ref 0.0–0.1)
Basophils Relative: 1.1 % (ref 0.0–3.0)
Eosinophils Absolute: 0.6 10*3/uL (ref 0.0–0.7)
Eosinophils Relative: 8.6 % — ABNORMAL HIGH (ref 0.0–5.0)
HCT: 44.2 % (ref 39.0–52.0)
Hemoglobin: 14.6 g/dL (ref 13.0–17.0)
Lymphocytes Relative: 30.2 % (ref 12.0–46.0)
Lymphs Abs: 2 10*3/uL (ref 0.7–4.0)
MCHC: 32.9 g/dL (ref 30.0–36.0)
MCV: 90 fl (ref 78.0–100.0)
Monocytes Absolute: 0.7 10*3/uL (ref 0.1–1.0)
Monocytes Relative: 11 % (ref 3.0–12.0)
Neutro Abs: 3.3 10*3/uL (ref 1.4–7.7)
Neutrophils Relative %: 49.1 % (ref 43.0–77.0)
Platelets: 210 10*3/uL (ref 150.0–400.0)
RBC: 4.92 Mil/uL (ref 4.22–5.81)
RDW: 15.1 % (ref 11.5–15.5)
WBC: 6.7 10*3/uL (ref 4.0–10.5)

## 2022-05-04 LAB — COMPREHENSIVE METABOLIC PANEL
ALT: 19 U/L (ref 0–53)
AST: 23 U/L (ref 0–37)
Albumin: 4.4 g/dL (ref 3.5–5.2)
Alkaline Phosphatase: 72 U/L (ref 39–117)
BUN: 17 mg/dL (ref 6–23)
CO2: 30 mEq/L (ref 19–32)
Calcium: 9.9 mg/dL (ref 8.4–10.5)
Chloride: 103 mEq/L (ref 96–112)
Creatinine, Ser: 0.88 mg/dL (ref 0.40–1.50)
GFR: 82.89 mL/min (ref >60.00)
Glucose, Bld: 90 mg/dL (ref 70–99)
Potassium: 4.5 mEq/L (ref 3.5–5.1)
Sodium: 141 mEq/L (ref 135–145)
Total Bilirubin: 0.6 mg/dL (ref 0.2–1.2)
Total Protein: 7.1 g/dL (ref 6.0–8.3)

## 2022-05-04 LAB — LIPID PANEL
Cholesterol: 173 mg/dL (ref 0–200)
HDL: 50.5 mg/dL (ref >39.00)
LDL Cholesterol: 103 mg/dL — ABNORMAL HIGH (ref 0–99)
NonHDL: 122.08
Total CHOL/HDL Ratio: 3
Triglycerides: 94 mg/dL (ref 0.0–149.0)
VLDL: 18.8 mg/dL (ref 0.0–40.0)

## 2022-05-10 ENCOUNTER — Ambulatory Visit (INDEPENDENT_AMBULATORY_CARE_PROVIDER_SITE_OTHER): Payer: PPO

## 2022-05-10 ENCOUNTER — Encounter: Payer: Self-pay | Admitting: Family Medicine

## 2022-05-10 ENCOUNTER — Ambulatory Visit (INDEPENDENT_AMBULATORY_CARE_PROVIDER_SITE_OTHER): Payer: PPO | Admitting: Family Medicine

## 2022-05-10 VITALS — BP 112/64 | HR 55 | Temp 97.2°F | Ht 66.0 in | Wt 159.0 lb

## 2022-05-10 VITALS — Ht 66.0 in | Wt 154.0 lb

## 2022-05-10 DIAGNOSIS — M2669 Other specified disorders of temporomandibular joint: Secondary | ICD-10-CM | POA: Diagnosis not present

## 2022-05-10 DIAGNOSIS — Z0000 Encounter for general adult medical examination without abnormal findings: Secondary | ICD-10-CM

## 2022-05-10 DIAGNOSIS — R7989 Other specified abnormal findings of blood chemistry: Secondary | ICD-10-CM

## 2022-05-10 DIAGNOSIS — Z23 Encounter for immunization: Secondary | ICD-10-CM

## 2022-05-10 DIAGNOSIS — K219 Gastro-esophageal reflux disease without esophagitis: Secondary | ICD-10-CM

## 2022-05-10 DIAGNOSIS — R21 Rash and other nonspecific skin eruption: Secondary | ICD-10-CM

## 2022-05-10 DIAGNOSIS — Z7189 Other specified counseling: Secondary | ICD-10-CM

## 2022-05-10 MED ORDER — FLUCONAZOLE 150 MG PO TABS: 150.0000 mg | ORAL_TABLET | ORAL | 0 refills | Status: DC

## 2022-05-10 MED ORDER — OMEPRAZOLE 20 MG PO CPDR: 20.0000 mg | DELAYED_RELEASE_CAPSULE | Freq: Every day | ORAL | Status: DC

## 2022-05-10 NOTE — Patient Instructions (Signed)
Use diflucan weekly for 2 doses and let me know if that isn't helping.  Take care.  Glad to see you.

## 2022-05-10 NOTE — Progress Notes (Signed)
Subjective:   Kyle Sanchez is a 76 y.o. male who presents for Medicare Annual/Subsequent preventive examination.  Review of Systems    No ROS.  Medicare Wellness Virtual Visit.  Visual/audio telehealth visit, UTA vital signs.   See social history for additional risk factors.   Cardiac Risk Factors include: advanced age (>75mn, >>54women);male gender     Objective:    Today's Vitals   05/10/22 0817  Weight: 154 lb (69.9 kg)  Height: '5\' 6"'$  (1.676 m)   Body mass index is 24.86 kg/m.     05/10/2022    8:20 AM 10/22/2019    1:57 PM 07/26/2018    8:39 AM  Advanced Directives  Does Patient Have a Medical Advance Directive? Yes Yes Yes  Type of AParamedicof AWhite Island ShoresLiving will HFent[place]Living will [place]Sh[place] ShoresLiving will  Does patient want to make changes to medical advance directive? No - Patient declined No - Patient declined   Copy of HM[place] Chart? No - copy requested No - copy requested No - copy requested    Current Medications (verified) Outpatient Encounter Medications as of 05/10/2022  Medication Sig   aspirin EC 81 MG tablet Take 81 mg by mouth daily.   Cholecalciferol 25 MCG (1000 UT) capsule Take 1,000 Units by mouth daily. Take 2 capsules daily.   clobetasol cream (TEMOVATE) 07.61% Apply 1 application topically 2 (two) times daily as needed.   Omega-3 Fatty Acids (RA FISH OIL) 1400 MG CPDR Take by mouth daily.   omeprazole (PRILOSEC) 20 MG capsule Take 1 capsule by mouth twice daily   polyethylene glycol (MIRALAX / GLYCOLAX) 17 g packet Take 17 g by mouth daily.   Red Yeast Rice Extract (RED YEAST RICE PO) Take 1 tablet by mouth daily.   SUPER B COMPLEX/C PO Take by mouth daily.   Ubiquinol 100 MG CAPS Take 100 mg by mouth daily.   vitamin E 400 UNIT capsule Take 400 Units by mouth daily.   No facility-administered encounter medications on file as of 05/10/2022.     Allergies (verified) Nortriptyline, Simvastatin, and Sucralfate   History: Past Medical History:  Diagnosis Date   Allergic rhinitis    Anxiety    Arthritis    Asthma    Bell's palsy    Cataract    Depression    GERD (gastroesophageal reflux disease)    neg path on EGD 06/14/13   Heart murmur    Hemorrhoids    Hiatal hernia    1 cm hill grade 2    History of migraines    Hx of adenomatous colonic polyps 07/2002   Hyperlipidemia    Hypertension    IBS (irritable bowel syndrome)    Osteoarthritis    Past Surgical History:  Procedure Laterality Date   CATARACT EXTRACTION, BILATERAL  2021   CHOLECYSTECTOMY     colonoscopy and endoscopy     COLONOSCOPY WITH PROPOFOL N/A 07/26/2018   Procedure: COLONOSCOPY WITH PROPOFOL;  Surgeon: Toledo, TBenay Pike MD;  Location: [facility] ENDOSCOPY;  Service: Gastroenterology;  Laterality: N/A;   ESOPHAGOGASTRODUODENOSCOPY (EGD) WITH PROPOFOL N/A 07/26/2018   Procedure: ESOPHAGOGASTRODUODENOSCOPY (EGD) WITH PROPOFOL;  Surgeon: Toledo, TBenay Pike MD;  Location: [facility] ENDOSCOPY;  Service: Gastroenterology;  Laterality: N/A;   Family History  Problem Relation Age of Onset   Nephrolithiasis Mother    Arthritis Mother    Stroke Mother    Alcohol abuse Father  Mental illness Sister    Prostate cancer Brother    Colon polyps Brother    Colon cancer Neg Hx    Esophageal cancer Neg Hx    Social History   Socioeconomic History   Marital status: Married    Spouse name: Not on file   Number of children: 2   Years of education: Not on file   Highest education level: Not on file  Occupational History   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    Fish farm manager: retired  Tobacco Use   Smoking status: Former   Smokeless tobacco: Never   Tobacco comments:    Stopped 30 years ago   Scientific laboratory technician Use: Never used  Substance and Sexual Activity   Alcohol use: Never    Alcohol/week: 0.0 standard drinks of alcohol   Drug use: No   Sexual  activity: Yes    Partners: Female  Other Topics Concern   Not on file  Social History Narrative   From [place], [place] since 1995   Married 1966   Social Determinants of Health   Financial Resource Strain: Low Risk  (05/10/2022)   Overall Financial Resource Strain (CARDIA)    Difficulty of Paying Living Expenses: Not hard at all  Food Insecurity: No Food Insecurity (05/10/2022)   Hunger Vital Sign    Worried About Running Out of Food in the Last Year: Never true    [place] in the Last Year: Never true  Transportation Needs: No Transportation Needs (05/10/2022)   PRAPARE - Hydrologist (Medical): No    Lack of Transportation (Non-Medical): No  Physical Activity: Sufficiently Active (05/10/2022)   Exercise Vital Sign    Days of Exercise per Week: 5 days    Minutes of Exercise per Session: 30 min  Stress: No Stress Concern Present (05/10/2022)   [place]    Feeling of Stress : Not at all  Social Connections: Unknown (05/10/2022)   Social Connection and Isolation Panel [NHANES]    Frequency of Communication with Friends and Family: More than three times a week    Frequency of Social Gatherings with Friends and Family: More than three times a week    Attends Religious Services: Not on Advertising copywriter or Organizations: Not on file    Attends Archivist Meetings: Not on file    Marital Status: Married    Tobacco Counseling Counseling given: Not Answered Tobacco comments: Stopped 30 years ago    Clinical Intake:  Pre-visit preparation completed: Yes        Diabetes: No  How often do you need to have someone help you when you read instructions, pamphlets, or other written materials from your doctor or pharmacy?: 1 - Never    Interpreter Needed?: No      Activities of Daily Living    05/10/2022    8:19 AM  In your present state  of health, do you have any difficulty performing the following activities:  Hearing? 0  Vision? 0  Difficulty concentrating or making decisions? 0  Walking or climbing stairs? 0  Dressing or bathing? 0  Doing errands, shopping? 0  Preparing Food and eating ? N  Using the Toilet? N  In the past six months, have you accidently leaked urine? N  Do you have problems with loss of bowel control? N  Managing your Medications? N  Managing your Finances? N  Housekeeping or managing your Housekeeping? N    Patient Care Team: Tonia Ghent, MD as PCP - General  Indicate any recent Medical Services you may have received from other than [facility] providers in the past year (date may be approximate).     Assessment:   This is a routine wellness examination for [place].  I connected with  Hulan Fess on 05/10/22 by a audio enabled telemedicine application and verified that I am speaking with the correct person using two identifiers.  Patient Location: Home  Provider Location: Office/Clinic  I discussed the limitations of evaluation and management by telemedicine. The patient expressed understanding and agreed to proceed.   Hearing/Vision screen Hearing Screening - Comments:: Patient is able to hear conversational tones without difficulty.  No issues reported.   Vision Screening - Comments:: Followed by Dr. Matilde Sprang Wears corrective lenses Cataract extraction, bilateral They have seen their ophthalmologist in the last 12 months.    Dietary issues and exercise activities discussed: Current Exercise Habits: Home exercise routine, Type of exercise: walking;strength training/weights (golf), Time (Minutes): 30, Frequency (Times/Week): 5, Weekly Exercise (Minutes/Week): 150, Intensity: Mild Healthy diet Good water intake   Goals Addressed               This Visit's Progress     Patient Stated     Maintain weight 150-160lb (pt-stated)         Depression Screen    05/10/2022     8:20 AM 10/13/2021    9:43 AM 05/12/2020   10:35 AM 04/21/2018   11:06 AM 04/12/2017   11:15 AM 04/02/2016    2:03 PM 02/26/2013   11:56 AM  PHQ 2/9 Scores  PHQ - 2 Score 0 2 0 0 0 0 0  PHQ- 9 Score  5         Fall Risk    05/10/2022    8:20 AM 05/07/2021   12:27 PM 05/12/2020   10:36 AM 02/05/2020    3:55 PM 04/23/2019    9:52 AM  [place] in the past year? 0 0 0 0 0  Comment    Emmi Telephone Survey: data to providers prior to load   Number falls in past yr: 0 0     Injury with Fall? 0 0     Risk for fall due to : No Fall Risks No Fall Risks     Follow up Falls evaluation completed Falls evaluation completed   Falls evaluation completed    [place]: Home free of loose throw rugs in walkways, pet beds, electrical cords, etc? Yes  Adequate lighting in your home to reduce risk of falls? Yes   ASSISTIVE DEVICES UTILIZED TO PREVENT FALLS: Life alert? No  Use of a cane, walker or w/c? No   TIMED UP AND GO: Was the test performed? No .   Cognitive Function:        05/10/2022    8:31 AM  6CIT Screen  What Year? 0 points  What month? 0 points  What time? 0 points  Count back from 20 0 points  Months in reverse 0 points  Repeat phrase 0 points  Total Score 0 points    Immunizations Immunization History  Administered Date(s) Administered   Fluad Quad(high Dose 65+) 03/21/2019, 06/11/2020, 05/07/2021   Influenza Split 04/08/2011, 05/01/2012   Influenza Whole 03/29/2008, 04/16/2009, 04/22/2010   Influenza,inj,Quad PF,6+ Mos 05/23/2013, 03/26/2014, 03/28/2015, 04/02/2016,  04/12/2017, 04/21/2018   Influenza-Unspecified 05/23/2013, 03/26/2014, 03/28/2015, 04/02/2016, 04/12/2017, 04/21/2018   PFIZER(Purple Top)SARS-COV-2 Vaccination 09/01/2019, 09/26/2019, 05/20/2020   Pneumococcal Conjugate-13 03/28/2015   Pneumococcal Polysaccharide-23 06/11/2005, 01/30/2010   Td 07/12/1989, 06/11/2005   Zoster Recombinat (Shingrix) 01/10/2020,  03/12/2020   TDAP status: Due, Education has been provided regarding the importance of this vaccine. Advised may receive this vaccine at local pharmacy or Health Dept. Aware to provide a copy of the vaccination record if obtained from local pharmacy or Health Dept. Verbalized acceptance and understanding.  Flu Vaccine status: Due, Education has been provided regarding the importance of this vaccine. Advised may receive this vaccine at local pharmacy or Health Dept. Aware to provide a copy of the vaccination record if obtained from local pharmacy or Health Dept. Verbalized acceptance and understanding.  Covid-19 vaccine status: Completed vaccines x3.  Screening Tests Health Maintenance  Topic Date Due   COVID-19 Vaccine (4 - Pfizer series) 05/26/2022 (Originally 07/15/2020)   INFLUENZA VACCINE  10/10/2022 (Originally 02/09/2022)   TETANUS/TDAP  05/11/2023 (Originally 06/12/2015)   Medicare Annual Wellness (AWV)  05/11/2023   COLONOSCOPY (Pts 45-36yr Insurance coverage will need to be confirmed)  10/22/2026   Pneumonia Vaccine 76 Years old  Completed   Hepatitis C Screening  Completed   Zoster Vaccines- Shingrix  Completed   HPV VACCINES  Aged Out    Health Maintenance There are no preventive care reminders to display for this patient.  Lung Cancer Screening: (Low Dose CT Chest recommended if Age 77-80years, 30 pack-year currently smoking OR have quit w/in 15years.) does not qualify.   Hepatitis C Screening: Completed 2017  Vision Screening: Recommended annual ophthalmology exams for early detection of glaucoma and other disorders of the eye.  Dental Screening: Recommended annual dental exams for proper oral hygiene  Community Resource Referral / Chronic Care Management: CRR required this visit?  No   CCM required this visit?  No      Plan:     I have personally reviewed and noted the following in the patient's chart:   Medical and social history Use of alcohol, tobacco or  illicit drugs  Current medications and supplements including opioid prescriptions. Patient is not currently taking opioid prescriptions. Functional ability and status Nutritional status Physical activity Advanced directives List of other physicians Hospitalizations, surgeries, and ER visits in previous 12 months Vitals Screenings to include cognitive, depression, and falls Referrals and appointments  In addition, I have reviewed and discussed with patient certain preventive protocols, quality metrics, and best practice recommendations. A written personalized care plan for preventive services as well as general preventive health recommendations were provided to patient.     DLeta Jungling LPN   [id number]

## 2022-05-10 NOTE — Patient Instructions (Addendum)
[name] , Thank you for taking time to come for your Medicare Wellness Visit. I appreciate your ongoing commitment to your health goals. Please review the following plan we discussed and let me know if I can assist you in the future.   These are the goals we discussed:  Goals       Patient Stated     Maintain weight 150-160lb (pt-stated)        This is a list of the screening recommended for you and due dates:  Health Maintenance  Topic Date Due   COVID-19 Vaccine (4 - Pfizer series) 05/26/2022*   Flu Shot  10/10/2022*   Tetanus Vaccine  05/11/2023*   Medicare Annual Wellness Visit  05/11/2023   Colon Cancer Screening  10/22/2026   Pneumonia Vaccine  Completed   Hepatitis C Screening: USPSTF Recommendation to screen - Ages 18-79 yo.  Completed   Zoster (Shingles) Vaccine  Completed   HPV Vaccine  Aged Out  *Topic was postponed. The date shown is not the original due date.   Advanced directives: End of life planning; Advance aging; Advanced directives discussed.  Copy of current HCPOA/Living Will requested.    Conditions/risks identified: none new  Next appointment: Follow up in one year for your annual wellness visit.   Preventive Care 77 Years and Older, Male  Preventive care refers to lifestyle choices and visits with your health care provider that can promote health and wellness. What does preventive care include? A yearly physical exam. This is also called an annual well check. Dental exams once or twice a year. Routine eye exams. Ask your health care provider how often you should have your eyes checked. Personal lifestyle choices, including: Daily care of your teeth and gums. Regular physical activity. Eating a healthy diet. Avoiding tobacco and drug use. Limiting alcohol use. Practicing safe sex. Taking low doses of aspirin every day. Taking vitamin and mineral supplements as recommended by your health care provider. What happens during an annual well  check? The services and screenings done by your health care provider during your annual well check will depend on your age, overall health, lifestyle risk factors, and family history of disease. Counseling  Your health care provider may ask you questions about your: Alcohol use. Tobacco use. Drug use. Emotional well-being. Home and relationship well-being. Sexual activity. Eating habits. History of falls. Memory and ability to understand (cognition). Work and work Statistician. Screening  You may have the following tests or measurements: Height, weight, and BMI. Blood pressure. Lipid and cholesterol levels. These may be checked every 5 years, or more frequently if you are over 71 years old. Skin check. Lung cancer screening. You may have this screening every year starting at age 38 if you have a 30-pack-year history of smoking and currently smoke or have quit within the past 15 years. Fecal occult blood test (FOBT) of the stool. You may have this test every year starting at age 39. Flexible sigmoidoscopy or colonoscopy. You may have a sigmoidoscopy every 5 years or a colonoscopy every 10 years starting at age 82. Prostate cancer screening. Recommendations will vary depending on your family history and other risks. Hepatitis C blood test. Hepatitis B blood test. Sexually transmitted disease (STD) testing. Diabetes screening. This is done by checking your blood sugar (glucose) after you have not eaten for a while (fasting). You may have this done every 1-3 years. Abdominal aortic aneurysm (AAA) screening. You may need this if you are a current or former  smoker. Osteoporosis. You may be screened starting at age 13 if you are at high risk. Talk with your health care provider about your test results, treatment options, and if necessary, the need for more tests. Vaccines  Your health care provider may recommend certain vaccines, such as: Influenza vaccine. This is recommended every  year. Tetanus, diphtheria, and acellular pertussis (Tdap, Td) vaccine. You may need a Td booster every 10 years. Zoster vaccine. You may need this after age 20. Pneumococcal 13-valent conjugate (PCV13) vaccine. One dose is recommended after age 54. Pneumococcal polysaccharide (PPSV23) vaccine. One dose is recommended after age 74. Talk to your health care provider about which screenings and vaccines you need and how often you need them. This information is not intended to replace advice given to you by your health care provider. Make sure you discuss any questions you have with your health care provider. Document Released: 07/25/2015 Document Revised: 03/17/2016 Document Reviewed: 04/29/2015 Elsevier Interactive Patient Education  2017 [place] Prevention in the Home Falls can cause injuries. They can happen to people of all ages. There are many things you can do to make your home safe and to help prevent falls. What can I do on the outside of my home? Regularly fix the edges of walkways and driveways and fix any cracks. Remove anything that might make you trip as you walk through a door, such as a raised step or threshold. Trim any bushes or trees on the path to your home. Use bright outdoor lighting. Clear any walking paths of anything that might make someone trip, such as rocks or tools. Regularly check to see if handrails are loose or broken. Make sure that both sides of any steps have handrails. Any raised decks and porches should have guardrails on the edges. Have any leaves, snow, or ice cleared regularly. Use sand or salt on walking paths during winter. Clean up any spills in your garage right away. This includes oil or grease spills. What can I do in the bathroom? Use night lights. Install grab bars by the toilet and in the tub and shower. Do not use towel bars as grab bars. Use non-skid mats or decals in the tub or shower. If you need to sit down in the shower, use a  plastic, non-slip stool. Keep the floor dry. Clean up any water that spills on the floor as soon as it happens. Remove soap buildup in the tub or shower regularly. Attach bath mats securely with double-sided non-slip rug tape. Do not have throw rugs and other things on the floor that can make you trip. What can I do in the bedroom? Use night lights. Make sure that you have a light by your bed that is easy to reach. Do not use any sheets or blankets that are too big for your bed. They should not hang down onto the floor. Have a firm chair that has side arms. You can use this for support while you get dressed. Do not have throw rugs and other things on the floor that can make you trip. What can I do in the kitchen? Clean up any spills right away. Avoid walking on wet floors. Keep items that you use a lot in easy-to-reach places. If you need to reach something above you, use a strong step stool that has a grab bar. Keep electrical cords out of the way. Do not use floor polish or wax that makes floors slippery. If you must use wax, use non-skid  floor wax. Do not have throw rugs and other things on the floor that can make you trip. What can I do with my stairs? Do not leave any items on the stairs. Make sure that there are handrails on both sides of the stairs and use them. Fix handrails that are broken or loose. Make sure that handrails are as long as the stairways. Check any carpeting to make sure that it is firmly attached to the stairs. Fix any carpet that is loose or worn. Avoid having throw rugs at the top or bottom of the stairs. If you do have throw rugs, attach them to the floor with carpet tape. Make sure that you have a light switch at the top of the stairs and the bottom of the stairs. If you do not have them, ask someone to add them for you. What else can I do to help prevent falls? Wear shoes that: Do not have high heels. Have rubber bottoms. Are comfortable and fit you  well. Are closed at the toe. Do not wear sandals. If you use a stepladder: Make sure that it is fully opened. Do not climb a closed stepladder. Make sure that both sides of the stepladder are locked into place. Ask someone to hold it for you, if possible. Clearly mark and make sure that you can see: Any grab bars or handrails. First and last steps. Where the edge of each step is. Use tools that help you move around (mobility aids) if they are needed. These include: Canes. Walkers. Scooters. Crutches. Turn on the lights when you go into a dark area. Replace any light bulbs as soon as they burn out. Set up your furniture so you have a clear path. Avoid moving your furniture around. If any of your floors are uneven, fix them. If there are any pets around you, be aware of where they are. Review your medicines with your doctor. Some medicines can make you feel dizzy. This can increase your chance of falling. Ask your doctor what other things that you can do to help prevent falls. This information is not intended to replace advice given to you by your health care provider. Make sure you discuss any questions you have with your health care provider. Document Released: 04/24/2009 Document Revised: 12/04/2015 Document Reviewed: 08/02/2014 Elsevier Interactive Patient Education  2017 [employer].

## 2022-05-10 NOTE — Progress Notes (Unsigned)
Rash L forearm.  Present for about 1 week, wax and wane.  Lotrisone didn't help.  He had leftover rx.    GERD.  S/p cholecystectomy.  He cut back to '20mg'$  omeprazole. It helped some but he is still putting up with sx at baseline.    Flu up-to-date Shingles up-to-date PNA up-to-date Tetanus 2006, discussed with patient.  It may be cheaper the pharmacy. COVID-vaccine previously done Colonoscopy 2021 Prostate cancer screening 2023 Advance directive-wife designated if patient were incapacitated.  CBC d/w pt.    His daughter in law needed a kidney transplant.    Meds, vitals, and allergies reviewed.   ROS: Per HPI unless specifically indicated in ROS section   GEN: nad, alert and oriented HEENT: mucous membranes moist NECK: supple w/o LA CV: rrr.   PULM: ctab, no inc wob ABD: soft, +bs EXT: no edema SKIN: blanching fungal rash on the L>R forearm and on the B posterior thighs.  No ulceration.

## 2022-05-11 DIAGNOSIS — Z0000 Encounter for general adult medical examination without abnormal findings: Secondary | ICD-10-CM | POA: Insufficient documentation

## 2022-05-11 DIAGNOSIS — M2669 Other specified disorders of temporomandibular joint: Secondary | ICD-10-CM

## 2022-05-11 DIAGNOSIS — R7989 Other specified abnormal findings of blood chemistry: Secondary | ICD-10-CM | POA: Insufficient documentation

## 2022-05-11 HISTORY — DX: Other specified disorders of temporomandibular joint: M26.69

## 2022-05-11 NOTE — Assessment & Plan Note (Signed)
Flu up-to-date Shingles up-to-date PNA up-to-date Tetanus 2006, discussed with patient.  It may be cheaper the pharmacy. COVID-vaccine previously done Colonoscopy 2021 Prostate cancer screening 2023 Advance directive-wife designated if patient were incapacitated.

## 2022-05-11 NOTE — Assessment & Plan Note (Signed)
CBC d/w pt. minimal increase in relative eosinophil count that is stable.  His absolute eosinophil count his white count is normal.  This appears to be unremarkable.  Discussed.

## 2022-05-11 NOTE — Assessment & Plan Note (Signed)
Would avoid chewing tough meats.  He is not having much pain.  If he is more symptomatic he can update me.

## 2022-05-11 NOTE — Assessment & Plan Note (Signed)
I think it makes sense to continue omeprazole as is for now.  He has a significant family history of GERD, with multiple family members with similar symptoms.  He has seen GI in the past.

## 2022-05-11 NOTE — Assessment & Plan Note (Addendum)
Discussed taking Diflucan weekly for 2 weeks and then let me know how he is feeling.  Okay for outpatient follow-up.  He agrees to plan.  I do not suspect that this is related to his GERD symptoms, d/w pt.  If he continues to have this rash we can refer him to dermatology.

## 2022-05-11 NOTE — Assessment & Plan Note (Signed)
Advance directive- wife designated if patient were incapacitated.  

## 2022-06-21 ENCOUNTER — Other Ambulatory Visit: Payer: Self-pay | Admitting: Gastroenterology

## 2022-07-27 ENCOUNTER — Ambulatory Visit (INDEPENDENT_AMBULATORY_CARE_PROVIDER_SITE_OTHER)
Admission: RE | Admit: 2022-07-27 | Discharge: 2022-07-27 | Disposition: A | Discharge status: Home / Self Care | Payer: Medicare HMO | Source: Ambulatory Visit | Attending: Family Medicine | Admitting: Family Medicine

## 2022-07-27 ENCOUNTER — Ambulatory Visit (INDEPENDENT_AMBULATORY_CARE_PROVIDER_SITE_OTHER): Payer: Medicare HMO | Admitting: Family Medicine

## 2022-07-27 ENCOUNTER — Encounter: Payer: Self-pay | Admitting: Family Medicine

## 2022-07-27 VITALS — BP 116/58 | HR 55 | Temp 97.7°F | Ht 66.0 in | Wt 162.0 lb

## 2022-07-27 DIAGNOSIS — R21 Rash and other nonspecific skin eruption: Secondary | ICD-10-CM

## 2022-07-27 DIAGNOSIS — M2550 Pain in unspecified joint: Secondary | ICD-10-CM | POA: Diagnosis not present

## 2022-07-27 DIAGNOSIS — M79642 Pain in left hand: Secondary | ICD-10-CM | POA: Diagnosis not present

## 2022-07-27 MED ORDER — CHOLECALCIFEROL 25 MCG (1000 UT) PO CAPS: 4000.0000 [IU] | ORAL_CAPSULE | Freq: Every day | ORAL | Status: AC

## 2022-07-27 MED ORDER — TRAMADOL HCL 50 MG PO TABS: 50.0000 mg | ORAL_TABLET | Freq: Three times a day (TID) | ORAL | 0 refills | Status: AC | PRN

## 2022-07-27 NOTE — Progress Notes (Signed)
Lower BP noted.  Not fatigued.  He is still exercising.  Not lightheaded on standing.   He noted that he could get more easily distracted now, compared to when he was working.  No red flag events with memory.  Discussed that when he was working he was on more of a schedule that would prevent/limit distraction.  Joint pain.  Chronic DIP and PIP joint changes.  Limited L DIP flexion on the 2-5th fingers.  Neck pain, posterior.  Not much knee pain.  Some lower back pain.  Tylenol isn't helping.  More pain with weather changes.    His brother was needing extra care in [place], d/w pt.  Patient has been checking on him frequently by phone.    He said previous fluctuating rash.  No change with antifungal previously.  We agreed that if it returned he can update me and we could refer him to dermatology if needed.  Meds, vitals, and allergies reviewed.   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

## 2022-07-27 NOTE — Patient Instructions (Signed)
Go to the lab on the way out.   If you have mychart we'll likely use that to update you.    Try tramadol for pain.  Sedation caution.  Let me know how that goes.   You could try a 1/2 tab initially.

## 2022-07-28 DIAGNOSIS — M2550 Pain in unspecified joint: Secondary | ICD-10-CM | POA: Insufficient documentation

## 2022-07-28 HISTORY — DX: Pain in unspecified joint: M25.50

## 2022-07-28 NOTE — Assessment & Plan Note (Signed)
Likely osteoarthritis but reasonable to check plain films to evaluate.  Use tramadol as needed.  Routine cautions given to patient.  He agrees to plan.  See notes on imaging.

## 2022-07-28 NOTE — Assessment & Plan Note (Signed)
He said previous fluctuating rash.  No change with antifungal previously.  We agreed that if it returned he can update me and we could refer him to dermatology if needed.

## 2022-08-16 DIAGNOSIS — R69 Illness, unspecified: Secondary | ICD-10-CM | POA: Diagnosis not present

## 2022-08-18 ENCOUNTER — Other Ambulatory Visit: Payer: Self-pay | Admitting: Gastroenterology

## 2022-08-18 DIAGNOSIS — R69 Illness, unspecified: Secondary | ICD-10-CM | POA: Diagnosis not present

## 2022-08-20 ENCOUNTER — Other Ambulatory Visit: Payer: Self-pay | Admitting: Gastroenterology

## 2022-08-22 ENCOUNTER — Other Ambulatory Visit: Payer: Self-pay | Admitting: Gastroenterology

## 2022-09-01 ENCOUNTER — Other Ambulatory Visit: Payer: Self-pay | Admitting: Gastroenterology

## 2022-09-22 ENCOUNTER — Ambulatory Visit (INDEPENDENT_AMBULATORY_CARE_PROVIDER_SITE_OTHER): Payer: Medicare HMO | Admitting: Family Medicine

## 2022-09-22 ENCOUNTER — Encounter: Payer: Self-pay | Admitting: Family Medicine

## 2022-09-22 VITALS — BP 130/70 | HR 70 | Temp 98.5°F | Ht 66.0 in | Wt 159.1 lb

## 2022-09-22 DIAGNOSIS — K219 Gastro-esophageal reflux disease without esophagitis: Secondary | ICD-10-CM

## 2022-09-22 DIAGNOSIS — M19049 Primary osteoarthritis, unspecified hand: Secondary | ICD-10-CM | POA: Diagnosis not present

## 2022-09-22 MED ORDER — OMEPRAZOLE 20 MG PO CPDR: 20.0000 mg | DELAYED_RELEASE_CAPSULE | Freq: Two times a day (BID) | ORAL | 1 refills | Status: DC

## 2022-09-22 NOTE — Progress Notes (Signed)
Kyle Sanchez T. Labrina Lines, MD, [place] [facility] [place], [address]  Phone: [phone]  FAX: [phone]  Kyle Sanchez - 77 y.o. male  MRN [id number]  Date of Birth: 25-Oct-1945  Date: 09/22/2022  PCP: Tonia Ghent, MD  Referral: Tonia Ghent, MD  Chief Complaint  Patient presents with   Gastroesophageal Reflux   Subjective:   Kyle Sanchez is a 77 y.o. very pleasant male patient with Body mass index is 25.68 kg/m. who presents with the following:  He has had acid reflux for many years.  Tired of his reflux.  He reports having had a history of reflux for roughly 60 years.  Tried to stop his PPI.  Tapered off, and he stopped his PPI entirely.   Does not eat fried food, onions, garlic.  Was feeling food, and then he went back to his diet.  Uses an air fryer.  After he stopped his PPI, his reflux came back with a vengeance.  Going to go to [place].    He also has diffuse hand osteoarthritis, and I saw him for this about 5 years ago.  Review of Systems is noted in the HPI, as appropriate  Objective:   BP 130/70   Pulse 70   Temp 98.5 F (36.9 C) (Temporal)   Ht '5\' 6"'$  (1.676 m)   Wt 159 lb 2 oz (72.2 kg)   SpO2 95%   BMI 25.68 kg/m   GEN: No acute distress; alert,appropriate. PULM: Breathing comfortably in no respiratory distress PSYCH: Normally interactive.   Extensive handout there is arthritis throughout both hands.  Extensive Heberden's nodes as well as some deformity of his PIP and DIP joints.  He is unable to make a full fist bilaterally.  Laboratory and Imaging Data:  Assessment and Plan:     ICD-10-CM   1. Gastroesophageal reflux disease, unspecified whether esophagitis present  K21.9     2. Hand arthritis  M19.049      I will write him for omeprazole twice daily, and also recommended he get some topical Voltaren gel for his arthritis.  Patient Instructions  Voltaren 1%  gel, over the counter You can apply up to 4 times a day  This can be applied to any joint: knee, wrist, fingers, elbows, shoulders, feet and ankles. Can apply to any tendon: tennis elbow, achilles, tendon, rotator cuff or any other tendon.  Minimal is absorbed in the bloodstream: ok with oral anti-inflammatory or a blood thinner.  Cost is about 9 dollars    Medication Management during today's office visit: Meds ordered this encounter  Medications   omeprazole (PRILOSEC) 20 MG capsule    Sig: Take 1 capsule (20 mg total) by mouth 2 (two) times daily before a meal.    Dispense:  180 capsule    Refill:  1   Medications Discontinued During This Encounter  Medication Reason   omeprazole (PRILOSEC) 20 MG capsule Reorder    Orders placed today for conditions managed today: No orders of the defined types were placed in this encounter.   Disposition: No follow-ups on file.  [employer] One speech-to-text software was used for transcription in this dictation.  Possible transcriptional errors can occur using Editor, commissioning.   Signed,  Maud Deed. Izela Altier, MD   Outpatient Encounter Medications as of 09/22/2022  Medication Sig   aspirin EC 81 MG tablet Take 81 mg by mouth daily.   Cholecalciferol 25 MCG (  1000 UT) capsule Take 4 capsules (4,000 Units total) by mouth daily.   clobetasol cream (TEMOVATE) [id number] % Apply 1 application topically 2 (two) times daily as needed.   Omega-3 Fatty Acids (RA FISH OIL) 1400 MG CPDR Take by mouth daily.   polyethylene glycol (MIRALAX / GLYCOLAX) 17 g packet Take 17 g by mouth daily.   Red Yeast Rice Extract (RED YEAST RICE PO) Take 1 tablet by mouth daily.   SUPER B COMPLEX/C PO Take by mouth daily.   Ubiquinol 100 MG CAPS Take 100 mg by mouth daily.   vitamin E 400 UNIT capsule Take 400 Units by mouth daily.   omeprazole (PRILOSEC) 20 MG capsule Take 1 capsule (20 mg total) by mouth 2 (two) times daily before a meal.   [DISCONTINUED] omeprazole  (PRILOSEC) 20 MG capsule Take 1 capsule by mouth twice daily (Patient not taking: Reported on 09/22/2022)   No facility-administered encounter medications on file as of 09/22/2022.

## 2022-09-22 NOTE — Patient Instructions (Signed)
Voltaren 1% gel, over the counter ?You can apply up to 4 times a day ? ?This can be applied to any joint: knee, wrist, fingers, elbows, shoulders, feet and ankles. ?Can apply to any tendon: tennis elbow, achilles, tendon, rotator cuff or any other tendon. ? ?Minimal is absorbed in the bloodstream: ok with oral anti-inflammatory or a blood thinner. ? ?Cost is about 9 dollars  ?

## 2022-11-02 ENCOUNTER — Ambulatory Visit (INDEPENDENT_AMBULATORY_CARE_PROVIDER_SITE_OTHER): Payer: Medicare HMO | Admitting: Family Medicine

## 2022-11-02 ENCOUNTER — Telehealth: Payer: Self-pay | Admitting: Family Medicine

## 2022-11-02 ENCOUNTER — Encounter: Payer: Self-pay | Admitting: Family Medicine

## 2022-11-02 VITALS — BP 118/80 | HR 53 | Temp 97.5°F | Ht 66.0 in | Wt 158.0 lb

## 2022-11-02 DIAGNOSIS — M19049 Primary osteoarthritis, unspecified hand: Secondary | ICD-10-CM | POA: Diagnosis not present

## 2022-11-02 DIAGNOSIS — M79643 Pain in unspecified hand: Secondary | ICD-10-CM

## 2022-11-02 MED ORDER — TRAMADOL HCL 50 MG PO TABS: 50.0000 mg | ORAL_TABLET | Freq: Two times a day (BID) | ORAL | 1 refills | Status: DC | PRN

## 2022-11-02 NOTE — Progress Notes (Unsigned)
B hand pain.  Tylenol doesn't help much.  Chronic IP changes.  Pain with ROM.  Voltaren gel didn't help.    His brother in [place] has chronic health concerns.  He had family on his mind in general, with some sleep disruption from that.    Meds, vitals, and allergies reviewed.   ROS: Per HPI unless specifically indicated in ROS section   Nad Ncat He has bilateral diffuse IP joint changes that appear chronic.  No active erythema.  He has less pronounced degenerative changes of the MCPs.  This matches up with previous x-rays, which were reviewed with the patient at the office visit.  Neurovascularly intact with pain with grip.

## 2022-11-02 NOTE — Telephone Encounter (Signed)
Pain of hand, unspecified laterality M79.643 He had rx for tramadol earlier this year.  Thanks.

## 2022-11-02 NOTE — Telephone Encounter (Signed)
Called [facility] pharmacy back and gave pharmacist dx code for rx.

## 2022-11-02 NOTE — Patient Instructions (Signed)
Try tramadol if needed for pain.  Sedation caution.  Update me as needed.  Take care.  Glad to see you.

## 2022-11-02 NOTE — Telephone Encounter (Signed)
Pharmacy contacted the office in regards to medication tramadol sent in by Dr. Para March, wants to know diagnosis code for patient as related to medication. If there is no diagnosis code pharmacy states they would only be able to fill this medication for 7 days. Please advise whenever possible, thank you.   traMADol (ULTRAM) 50 MG tablet

## 2022-11-03 NOTE — Assessment & Plan Note (Signed)
It looks like he has this not rheumatoid arthritis.  Discussed.  Would limit NSAIDs given his history of GERD.  Discussed.  He agrees.  He had used tramadol previously without adverse effect.  Would retry that in the meantime with routine tramadol cautions discussed.  He understood.  He can update me as needed.

## 2023-01-03 ENCOUNTER — Telehealth: Payer: Self-pay | Admitting: Family Medicine

## 2023-01-03 NOTE — Telephone Encounter (Signed)
Please see what details you can et and let me know.  Thanks.

## 2023-01-03 NOTE — Telephone Encounter (Signed)
Spoke with patient and he states he has had a dry cough x 2 weeks; no other sx other then the pain he gets after coughing too much. He's been taking nyquil and dayquil.

## 2023-01-03 NOTE — Telephone Encounter (Signed)
Pt called requesting an appt with Dr. Para March, for a cold he has. Pt states he feels bad, has a cough & chest pain from coughing. Pt stated he only wanted to see Dr. Para March only, I offered sooner appts. Scheduled ov on Fri, 6/28, per pt's request. Pt also declined triage. Call back # [phone]

## 2023-01-04 MED ORDER — BENZONATATE 200 MG PO CAPS: 200.0000 mg | ORAL_CAPSULE | Freq: Three times a day (TID) | ORAL | 1 refills | Status: DC | PRN

## 2023-01-04 NOTE — Telephone Encounter (Signed)
Called patient reviewed all information and repeated back to me. Will call if any questions.  He will pick up meds and try. He will call if any changes or new symptoms.

## 2023-01-04 NOTE — Telephone Encounter (Signed)
I would try taking Tessalon in the meantime.  See if that helps with cough.  I sent the prescription.  If he is worse in the meantime or has new symptoms then please let us know.  I would keep the office visit as scheduled for now.  If he is clearly better he can cancel.  Thanks.

## 2023-01-04 NOTE — Addendum Note (Signed)
Addended by: Joaquim Nam on: 01/04/2023 07:02 AM   Modules accepted: Orders

## 2023-01-07 ENCOUNTER — Encounter: Payer: Self-pay | Admitting: Family Medicine

## 2023-01-07 ENCOUNTER — Ambulatory Visit (INDEPENDENT_AMBULATORY_CARE_PROVIDER_SITE_OTHER): Payer: Medicare HMO | Admitting: Family Medicine

## 2023-01-07 VITALS — BP 124/72 | HR 63 | Temp 97.9°F | Ht 66.0 in | Wt 156.0 lb

## 2023-01-07 DIAGNOSIS — R5383 Other fatigue: Secondary | ICD-10-CM

## 2023-01-07 DIAGNOSIS — E785 Hyperlipidemia, unspecified: Secondary | ICD-10-CM

## 2023-01-07 DIAGNOSIS — R059 Cough, unspecified: Secondary | ICD-10-CM | POA: Diagnosis not present

## 2023-01-07 DIAGNOSIS — M19049 Primary osteoarthritis, unspecified hand: Secondary | ICD-10-CM

## 2023-01-07 DIAGNOSIS — M542 Cervicalgia: Secondary | ICD-10-CM | POA: Diagnosis not present

## 2023-01-07 LAB — CBC WITH DIFFERENTIAL/PLATELET
Basophils Absolute: 0.2 10*3/uL — ABNORMAL HIGH (ref 0.0–0.1)
Basophils Relative: 2.7 % (ref 0.0–3.0)
Eosinophils Absolute: 0.4 10*3/uL (ref 0.0–0.7)
Eosinophils Relative: 5.1 % — ABNORMAL HIGH (ref 0.0–5.0)
HCT: 44.4 % (ref 39.0–52.0)
Hemoglobin: 14.7 g/dL (ref 13.0–17.0)
Lymphocytes Relative: 19.6 % (ref 12.0–46.0)
Lymphs Abs: 1.4 10*3/uL (ref 0.7–4.0)
MCHC: 33 g/dL (ref 30.0–36.0)
MCV: 89.4 fl (ref 78.0–100.0)
Monocytes Absolute: 0.8 10*3/uL (ref 0.1–1.0)
Monocytes Relative: 10.9 % (ref 3.0–12.0)
Neutro Abs: 4.4 10*3/uL (ref 1.4–7.7)
Neutrophils Relative %: 61.7 % (ref 43.0–77.0)
Platelets: 281 10*3/uL (ref 150.0–400.0)
RBC: 4.97 Mil/uL (ref 4.22–5.81)
RDW: 14.3 % (ref 11.5–15.5)
WBC: 7.1 10*3/uL (ref 4.0–10.5)

## 2023-01-07 LAB — COMPREHENSIVE METABOLIC PANEL
ALT: 16 U/L (ref 0–53)
AST: 21 U/L (ref 0–37)
Albumin: 4.3 g/dL (ref 3.5–5.2)
Alkaline Phosphatase: 72 U/L (ref 39–117)
BUN: 13 mg/dL (ref 6–23)
CO2: 33 mEq/L — ABNORMAL HIGH (ref 19–32)
Calcium: 10.3 mg/dL (ref 8.4–10.5)
Chloride: 100 mEq/L (ref 96–112)
Creatinine, Ser: 0.8 mg/dL (ref 0.40–1.50)
GFR: 84.91 mL/min (ref >60.00)
Glucose, Bld: 93 mg/dL (ref 70–99)
Potassium: 4.8 mEq/L (ref 3.5–5.1)
Sodium: 139 mEq/L (ref 135–145)
Total Bilirubin: 0.5 mg/dL (ref 0.2–1.2)
Total Protein: 7.2 g/dL (ref 6.0–8.3)

## 2023-01-07 LAB — TSH: TSH: 1.03 u[IU]/mL (ref 0.35–5.50)

## 2023-01-07 LAB — VITAMIN B12: Vitamin B-12: >1500 pg/mL — ABNORMAL HIGH (ref 211–911)

## 2023-01-07 NOTE — Progress Notes (Signed)
Cough going on for about 2 weeks.  Tried nyquil and dayquil.  Didn't take tessalon in the meantime.  Cough is better in the meantime.  No sputum.  Had runny nose, discolored nasal discharge.  No fevers.    He has/had fatigue and feeling of episodic diffuse weakness/aches that predates the cough.  He had a cold when he was in [facility] about 2 months.    He has some occ R sided occipital and lateral pain on the scalp.  It doesn't cross the midline.  H/o neck pain noted.    Less hand pain with tramadol.  He had abd pain after recurrent use- but it was unclear if the medication was the causative issue.  He could tolerate episodic use.  D/w pt.    Mild eczema changes on the L forearm- that is a recent change.  Restarted clobetasol in the meantime.  That helped.    Meds, vitals, and allergies reviewed.   ROS: Per HPI unless specifically indicated in ROS section   NAD NCAT TM wnl B OP and nasal exam wnl Neck supple, no LA Rrr Ctab Abd soft, not ttp Ext w/o edema.  Mild eczema changes on the L forearm

## 2023-01-07 NOTE — Patient Instructions (Addendum)
Stop the red yeast rice for 2 weeks and see if the aches get better.  Take care.  Glad to see you. Go to the lab on the way out.   If you have mychart we'll likely use that to update you.    Let me know if the cough doesn't continue to get better.   You should get a call about PT.  You can go ahead and call in the meantime.

## 2023-01-09 ENCOUNTER — Other Ambulatory Visit: Payer: Self-pay | Admitting: Family Medicine

## 2023-01-09 DIAGNOSIS — R059 Cough, unspecified: Secondary | ICD-10-CM | POA: Insufficient documentation

## 2023-01-09 DIAGNOSIS — M542 Cervicalgia: Secondary | ICD-10-CM

## 2023-01-09 DIAGNOSIS — R5383 Other fatigue: Secondary | ICD-10-CM | POA: Insufficient documentation

## 2023-01-09 HISTORY — DX: Cervicalgia: M54.2

## 2023-01-09 HISTORY — DX: Other fatigue: R53.83

## 2023-01-09 NOTE — Assessment & Plan Note (Signed)
Improving, lungs clear.  He could have a postinfectious cough that should gradually resolve.  Update me as needed.

## 2023-01-09 NOTE — Assessment & Plan Note (Signed)
He can stop red yeast rice and see if his symptoms improved.  See after visit summary.

## 2023-01-09 NOTE — Assessment & Plan Note (Signed)
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 Referral placed.

## 2023-01-09 NOTE — Assessment & Plan Note (Signed)
Unclear if abdominal pain was related to tramadol use.  He can at least use it episodically.  He can update me as needed.

## 2023-01-09 NOTE — Assessment & Plan Note (Signed)
See notes on labs. 

## 2023-03-15 ENCOUNTER — Ambulatory Visit (INDEPENDENT_AMBULATORY_CARE_PROVIDER_SITE_OTHER): Payer: Medicare HMO | Admitting: Family Medicine

## 2023-03-15 ENCOUNTER — Encounter: Payer: Self-pay | Admitting: Family Medicine

## 2023-03-15 VITALS — BP 122/74 | HR 73 | Temp 98.1°F | Ht 66.0 in | Wt 158.0 lb

## 2023-03-15 DIAGNOSIS — L989 Disorder of the skin and subcutaneous tissue, unspecified: Secondary | ICD-10-CM | POA: Diagnosis not present

## 2023-03-15 DIAGNOSIS — M19049 Primary osteoarthritis, unspecified hand: Secondary | ICD-10-CM

## 2023-03-15 DIAGNOSIS — M79643 Pain in unspecified hand: Secondary | ICD-10-CM | POA: Diagnosis not present

## 2023-03-15 MED ORDER — DOXYCYCLINE HYCLATE 100 MG PO TABS: 100.0000 mg | ORAL_TABLET | Freq: Two times a day (BID) | ORAL | 0 refills | Status: DC

## 2023-03-15 NOTE — Patient Instructions (Signed)
Warm compresses during the day and gently squeeze the area.  It may still drain some.   Use a bandaid with neosporin at night.  Remove in the mornings.   If more pain/swelling/redness, then start doxy and let us know.   Take care.  Glad to see you.

## 2023-03-15 NOTE — Progress Notes (Signed)
Lesion on back of right leg x 2 weeks. Patient states it was sore and draining but now it is getting better.  R posterior upper thigh.  No pain now.  No fevers.  Some local itching now.    L 4th PIP joint pain with OA.  D/w pt about ortho eval/hand clinic for possible injection.  Chronic IP joint changes across the hands.  Previous imaging discussed with patient.  Meds, vitals, and allergies reviewed.   ROS: Per HPI unless specifically indicated in ROS section   Nad Ncat Diffuse IP joint changes noted across the hands. Right upper thigh with well-demarcated area of pinkish skin changes.  No purulent discharge.  There is a central pore/opening where it looks like he had drainage prior.  Area is not tender.  No fluctuant mass.

## 2023-03-16 DIAGNOSIS — B880 Other acariasis: Secondary | ICD-10-CM | POA: Diagnosis not present

## 2023-03-16 DIAGNOSIS — H35372 Puckering of macula, left eye: Secondary | ICD-10-CM | POA: Diagnosis not present

## 2023-03-16 DIAGNOSIS — L989 Disorder of the skin and subcutaneous tissue, unspecified: Secondary | ICD-10-CM | POA: Insufficient documentation

## 2023-03-16 DIAGNOSIS — H26493 Other secondary cataract, bilateral: Secondary | ICD-10-CM | POA: Diagnosis not present

## 2023-03-16 DIAGNOSIS — H21233 Degeneration of iris (pigmentary), bilateral: Secondary | ICD-10-CM | POA: Diagnosis not present

## 2023-03-16 DIAGNOSIS — H353132 Nonexudative age-related macular degeneration, bilateral, intermediate dry stage: Secondary | ICD-10-CM | POA: Diagnosis not present

## 2023-03-16 DIAGNOSIS — H524 Presbyopia: Secondary | ICD-10-CM | POA: Diagnosis not present

## 2023-03-16 DIAGNOSIS — H01009 Unspecified blepharitis unspecified eye, unspecified eyelid: Secondary | ICD-10-CM | POA: Diagnosis not present

## 2023-03-16 HISTORY — DX: Disorder of the skin and subcutaneous tissue, unspecified: L98.9

## 2023-03-16 NOTE — Assessment & Plan Note (Signed)
Discussed options.  No need for I&D at this point. Warm compresses during the day and gently squeeze the area.  It may still drain some.   Use a bandaid with neosporin at night.  Remove in the mornings.   If more pain/swelling/redness, then start doxy and let us know.  He agrees to plan.  Okay for outpatient follow-up.

## 2023-03-16 NOTE — Assessment & Plan Note (Signed)
I think it makes sense to see orthopedics for consideration of injection.  Discussed.

## 2023-03-17 ENCOUNTER — Encounter: Payer: Self-pay | Admitting: *Deleted

## 2023-03-22 DIAGNOSIS — M13842 Other specified arthritis, left hand: Secondary | ICD-10-CM | POA: Diagnosis not present

## 2023-03-22 DIAGNOSIS — Z0100 Encounter for examination of eyes and vision without abnormal findings: Secondary | ICD-10-CM | POA: Diagnosis not present

## 2023-03-23 ENCOUNTER — Encounter: Payer: Self-pay | Admitting: Family Medicine

## 2023-03-23 ENCOUNTER — Other Ambulatory Visit: Payer: Self-pay | Admitting: Family Medicine

## 2023-03-23 MED ORDER — DOXYCYCLINE HYCLATE 100 MG PO TABS: 100.0000 mg | ORAL_TABLET | Freq: Two times a day (BID) | ORAL | 0 refills | Status: DC

## 2023-03-23 NOTE — Telephone Encounter (Signed)
Looks like that script was printed ok for me to call into pharmacy as requested?

## 2023-03-30 ENCOUNTER — Ambulatory Visit (INDEPENDENT_AMBULATORY_CARE_PROVIDER_SITE_OTHER): Payer: Medicare HMO | Admitting: *Deleted

## 2023-03-30 DIAGNOSIS — Z0000 Encounter for general adult medical examination without abnormal findings: Secondary | ICD-10-CM

## 2023-03-30 NOTE — Progress Notes (Signed)
Subjective:   Kyle Sanchez is a 77 y.o. male who presents for Medicare Annual/Subsequent preventive examination.  Visit Complete: Virtual  I connected with  Kyle Sanchez on 03/30/23 by a audio enabled telemedicine application and verified that I am speaking with the correct person using two identifiers.  Patient Location: Home  Provider Location: Home Office  I discussed the limitations of evaluation and management by telemedicine. The patient expressed understanding and agreed to proceed.  Vital Signs: Unable to obtain new vitals due to this being a telehealth visit.   Cardiac Risk Factors include: advanced age (>18men, >7 women);male gender     Objective:    Today's Vitals   03/30/23 1127  PainSc: 2    There is no height or weight on file to calculate BMI.     03/30/2023   11:34 AM 05/10/2022    8:20 AM 10/22/2019    1:57 PM 07/26/2018    8:39 AM  Advanced Directives  Does Patient Have a Medical Advance Directive? Yes Yes Yes Yes  Type of Estate agent of [employer] Power of [place];Living will Healthcare Power of [place];Living will Healthcare Power of [place];Living will  Does patient want to make changes to medical advance directive?  No - Patient declined No - Patient declined   Copy of Healthcare Power of Attorney in Chart? Yes - validated most recent copy scanned in chart (See row information) No - copy requested No - copy requested No - copy requested    Current Medications (verified) Outpatient Encounter Medications as of 03/30/2023  Medication Sig   aspirin EC 81 MG tablet Take 81 mg by mouth daily.   Cholecalciferol 25 MCG (1000 UT) capsule Take 4 capsules (4,000 Units total) by mouth daily.   clobetasol cream (TEMOVATE) 0.05 % Apply 1 application topically 2 (two) times daily as needed.   doxycycline (VIBRA-TABS) 100 MG tablet Take 1 tablet (100 mg total) by mouth 2 (two) times daily.   Omega-3 Fatty Acids (RA FISH OIL) 1400  MG CPDR Take by mouth daily.   omeprazole (PRILOSEC) 20 MG capsule Take 1 capsule (20 mg total) by mouth 2 (two) times daily before a meal.   polyethylene glycol (MIRALAX / GLYCOLAX) 17 g packet Take 17 g by mouth daily.   Red Yeast Rice Extract (RED YEAST RICE PO) Take 1 tablet by mouth daily.   traMADol (ULTRAM) 50 MG tablet Take 1 tablet (50 mg total) by mouth every 12 (twelve) hours as needed (for hand pain).   Ubiquinol 100 MG CAPS Take 100 mg by mouth daily.   vitamin E 400 UNIT capsule Take 400 Units by mouth daily.   No facility-administered encounter medications on file as of 03/30/2023.    Allergies (verified) Nortriptyline, Simvastatin, and Sucralfate   History: Past Medical History:  Diagnosis Date   Allergic rhinitis    Anxiety    Arthritis    Asthma    Bell's palsy    Cataract    Depression    GERD (gastroesophageal reflux disease)    neg path on EGD 06/14/13   Heart murmur    Hemorrhoids    Hiatal hernia    1 cm hill grade 2    History of migraines    Hx of adenomatous colonic polyps 07/2002   Hyperlipidemia    Hypertension    IBS (irritable bowel syndrome)    Osteoarthritis    Past Surgical History:  Procedure Laterality Date   CATARACT EXTRACTION, BILATERAL  2021  CHOLECYSTECTOMY     colonoscopy and endoscopy     COLONOSCOPY WITH PROPOFOL N/A 07/26/2018   Procedure: COLONOSCOPY WITH PROPOFOL;  Surgeon: Toledo, Boykin Nearing, MD;  Location: [facility] ENDOSCOPY;  Service: Gastroenterology;  Laterality: N/A;   ESOPHAGOGASTRODUODENOSCOPY (EGD) WITH PROPOFOL N/A 07/26/2018   Procedure: ESOPHAGOGASTRODUODENOSCOPY (EGD) WITH PROPOFOL;  Surgeon: Toledo, Boykin Nearing, MD;  Location: [facility] ENDOSCOPY;  Service: Gastroenterology;  Laterality: N/A;   Family History  Problem Relation Age of Onset   Nephrolithiasis Mother    Arthritis Mother    Stroke Mother    Alcohol abuse Father    Mental illness Sister    Prostate cancer Brother    Colon polyps Brother    Colon cancer  Neg Hx    Esophageal cancer Neg Hx    Social History   Socioeconomic History   Marital status: Married    Spouse name: Not on file   Number of children: 2   Years of education: Not on file   Highest education level: Not on file  Occupational History   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    Associate Professor: retired  Tobacco Use   Smoking status: Former   Smokeless tobacco: Never   Tobacco comments:    Stopped 30 years ago   Psychologist, educational Use   Vaping status: Never Used  Substance and Sexual Activity   Alcohol use: Never    Alcohol/week: 0.0 standard drinks of alcohol   Drug use: No   Sexual activity: Yes    Partners: Female  Other Topics Concern   Not on file  Social History Narrative   From [place] since [place]   Married 1966   Speaks English and Spanish   Social Determinants of Health   Financial Resource Strain: Low Risk  (03/30/2023)   Overall Financial Resource Strain (CARDIA)    Difficulty of Paying Living Expenses: Not hard at all  Food Insecurity: No Food Insecurity (03/30/2023)   Hunger Vital Sign    Worried About Running Out of Food in the Last Year: Never true    Ran Out of Food in the Last Year: Never true  Transportation Needs: No Transportation Needs (03/30/2023)   PRAPARE - Administrator, Civil Service (Medical): No    Lack of Transportation (Non-Medical): No  Physical Activity: Sufficiently Active (03/30/2023)   Exercise Vital Sign    Days of Exercise per Week: 4 days    Minutes of Exercise per Session: 40 min  Stress: No Stress Concern Present (03/30/2023)   [employer] of Occupational Health - Occupational Stress Questionnaire    Feeling of Stress : Not at all  Social Connections: Moderately Integrated (03/30/2023)   Social Connection and Isolation Panel [NHANES]    Frequency of Communication with Friends and Family: More than three times a week    Frequency of Social Gatherings with Friends and Family: Twice a week    Attends  Religious Services: More than 4 times per year    Active Member of [employer] or Organizations: No    Attends Engineer, structural: Never    Marital Status: Married    Tobacco Counseling Counseling given: Not Answered Tobacco comments: Stopped 30 years ago    Clinical Intake:  Pre-visit preparation completed: Yes  Pain : 0-10 Pain Score: 2  Pain Location: Finger (Comment which one) Pain Orientation: Left Pain Onset: More than a month ago Pain Frequency: Intermittent     Diabetes: No  How often do you need to  have someone help you when you read instructions, pamphlets, or other written materials from your doctor or pharmacy?: 1 - Never  Interpreter Needed?: No  Information entered by :: Remi Haggard LPN   Activities of Daily Living    03/30/2023   11:30 AM 05/10/2022    8:19 AM  In your present state of health, do you have any difficulty performing the following activities:  Hearing? 0 0  Vision? 0 0  Difficulty concentrating or making decisions? 0 0  Walking or climbing stairs? 0 0  Dressing or bathing? 0 0  Doing errands, shopping? 0 0  Preparing Food and eating ? N N  Using the Toilet? N N  In the past six months, have you accidently leaked urine? N N  Do you have problems with loss of bowel control? N N  Managing your Medications? N N  Managing your Finances? N N  Housekeeping or managing your Housekeeping? N N    Patient Care Team: Joaquim Nam, MD as PCP - General Barron Alvine Verlin Dike, DO as Consulting Physician (Gastroenterology)  Indicate any recent Medical Services you may have received from other than [facility] providers in the past year (date may be approximate).     Assessment:   This is a routine wellness examination for Kyle Sanchez.  Hearing/Vision screen Hearing Screening - Comments:: No trouble hearing Vision Screening - Comments:: Up to date [facility] Doctor   Goals Addressed             This Visit's Progress    Patient Stated        Maintain current weight       Depression Screen    03/30/2023   11:32 AM 03/15/2023    4:02 PM 01/07/2023   12:01 PM 11/02/2022    1:50 PM 07/27/2022   10:04 AM 05/10/2022    8:20 AM 10/13/2021    9:43 AM  PHQ 2/9 Scores  PHQ - 2 Score 0 2 2 1  0 0 2  PHQ- 9 Score 2 5 5 4 1  5     Fall Risk    03/30/2023   11:28 AM 03/15/2023    4:02 PM 03/15/2023    3:03 PM 01/07/2023   12:01 PM 11/02/2022    1:49 PM  Fall Risk   Falls in the past year? 0 0 0 0 0  Number falls in past yr: 0 0 0 0 0  Injury with Fall? 0 0 0 0 0  Risk for fall due to :  No Fall Risks No Fall Risks No Fall Risks No Fall Risks  Follow up Falls evaluation completed;Education provided;Falls prevention discussed Falls evaluation completed Falls evaluation completed Falls evaluation completed Falls evaluation completed    MEDICARE RISK AT HOME: Medicare Risk at Home Any stairs in or around the home?: Yes If so, are there any without handrails?: No Home free of loose throw rugs in walkways, pet beds, electrical cords, etc?: Yes Adequate lighting in your home to reduce risk of falls?: Yes Life alert?: No Use of a cane, walker or w/c?: No Grab bars in the bathroom?: Yes Shower chair or bench in shower?: Yes Elevated toilet seat or a handicapped toilet?: No  TIMED UP AND GO:  Was the test performed?  No    Cognitive Function:        03/30/2023   11:33 AM 05/10/2022    8:31 AM  6CIT Screen  What Year? 0 points 0 points  What month? 0 points  0 points  What time? 0 points 0 points  Count back from 20 0 points 0 points  Months in reverse 0 points 0 points  Repeat phrase 0 points 0 points  Total Score 0 points 0 points    Immunizations Immunization History  Administered Date(s) Administered   Fluad Quad(high Dose 65+) 03/21/2019, 06/11/2020, 05/07/2021, 05/10/2022   Influenza Split 04/08/2011, 05/01/2012   Influenza Whole 03/29/2008, 04/16/2009, 04/22/2010   Influenza,inj,Quad PF,6+ Mos 05/23/2013,  03/26/2014, 03/28/2015, 04/02/2016, 04/12/2017, 04/21/2018   Influenza-Unspecified 05/23/2013, 03/26/2014, 03/28/2015, 04/02/2016, 04/12/2017, 04/21/2018   PFIZER(Purple Top)SARS-COV-2 Vaccination 09/01/2019, 09/26/2019, 05/20/2020   Pneumococcal Conjugate-13 03/28/2015   Pneumococcal Polysaccharide-23 06/11/2005, 01/30/2010   Td 07/12/1989, 06/11/2005   Zoster Recombinant(Shingrix) 11/23/2019, 01/31/2020    TDAP status: Due, Education has been provided regarding the importance of this vaccine. Advised may receive this vaccine at local pharmacy or Health Dept. Aware to provide a copy of the vaccination record if obtained from local pharmacy or Health Dept. Verbalized acceptance and understanding.  Flu Vaccine status: Due, Education has been provided regarding the importance of this vaccine. Advised may receive this vaccine at local pharmacy or Health Dept. Aware to provide a copy of the vaccination record if obtained from local pharmacy or Health Dept. Verbalized acceptance and understanding.  Pneumococcal vaccine status: Up to date  Covid-19 vaccine status: Declined, Education has been provided regarding the importance of this vaccine but patient still declined. Advised may receive this vaccine at local pharmacy or Health Dept.or vaccine clinic. Aware to provide a copy of the vaccination record if obtained from local pharmacy or Health Dept. Verbalized acceptance and understanding.  Qualifies for Shingles Vaccine? No   Zostavax completed Yes   Shingrix Completed?: Yes  Screening Tests Health Maintenance  Topic Date Due   INFLUENZA VACCINE  10/10/2023 (Originally 02/10/2023)   Medicare Annual Wellness (AWV)  03/29/2024   Colonoscopy  10/22/2026   Pneumonia Vaccine 77+ Years old  Completed   Hepatitis C Screening  Completed   Zoster Vaccines- Shingrix  Completed   HPV VACCINES  Aged Out   DTaP/Tdap/Td  Discontinued   COVID-19 Vaccine  Discontinued    Health Maintenance  There are no  preventive care reminders to display for this patient.   Colorectal cancer screening: No longer required.   Lung Cancer Screening: (Low Dose CT Chest recommended if Age 14-80 years, 20 pack-year currently smoking OR have quit w/in 15years.) does not qualify.   Lung Cancer Screening Referral:   Additional Screening:  Hepatitis C Screening: does not qualify; Completed 2017  Vision Screening: Recommended annual ophthalmology exams for early detection of glaucoma and other disorders of the eye. Is the patient up to date with their annual eye exam?  Yes  Who is the provider or what is the name of the office in which the patient attends annual eye exams? [facility] If pt is not established with a provider, would they like to be referred to a provider to establish care? No .   Dental Screening: Recommended annual dental exams for proper oral hygiene    Community Resource Referral / Chronic Care Management: CRR required this visit?  No   CCM required this visit?  No     Plan:     I have personally reviewed and noted the following in the patient's chart:   Medical and social history Use of alcohol, tobacco or illicit drugs  Current medications and supplements including opioid prescriptions. Patient is not currently taking opioid prescriptions. Functional  ability and status Nutritional status Physical activity Advanced directives List of other physicians Hospitalizations, surgeries, and ER visits in previous 12 months Vitals Screenings to include cognitive, depression, and falls Referrals and appointments  In addition, I have reviewed and discussed with patient certain preventive protocols, quality metrics, and best practice recommendations. A written personalized care plan for preventive services as well as general preventive health recommendations were provided to patient.     Remi Haggard, LPN   5/73/2202   After Visit Summary: (MyChart) Due to this being a  telephonic visit, the after visit summary with patients personalized plan was offered to patient via MyChart   Nurse Notes:

## 2023-03-30 NOTE — Patient Instructions (Signed)
[name] , Thank you for taking time to come for your Medicare Wellness Visit. I appreciate your ongoing commitment to your health goals. Please review the following plan we discussed and let me know if I can assist you in the future.   Screening recommendations/referrals: Colonoscopy: no longer required Recommended yearly ophthalmology/optometry visit for glaucoma screening and checkup Recommended yearly dental visit for hygiene and checkup  Vaccinations: Influenza vaccine: Education provided Pneumococcal vaccine: up to date Tdap vaccine: Education provided Shingles vaccine:   up to date  Advanced directives:     Preventive Care 65 Years and Older, Male Preventive care refers to lifestyle choices and visits with your health care provider that can promote health and wellness. What does preventive care include? A yearly physical exam. This is also called an annual well check. Dental exams once or twice a year. Routine eye exams. Ask your health care provider how often you should have your eyes checked. Personal lifestyle choices, including: Daily care of your teeth and gums. Regular physical activity. Eating a healthy diet. Avoiding tobacco and drug use. Limiting alcohol use. Practicing safe sex. Taking low doses of aspirin every day. Taking vitamin and mineral supplements as recommended by your health care provider. What happens during an annual well check? The services and screenings done by your health care provider during your annual well check will depend on your age, overall health, lifestyle risk factors, and family history of disease. Counseling  Your health care provider may ask you questions about your: Alcohol use. Tobacco use. Drug use. Emotional well-being. Home and relationship well-being. Sexual activity. Eating habits. History of falls. Memory and ability to understand (cognition). Work and work Astronomer. Screening  You may have the following tests or  measurements: Height, weight, and BMI. Blood pressure. Lipid and cholesterol levels. These may be checked every 5 years, or more frequently if you are over 67 years old. Skin check. Lung cancer screening. You may have this screening every year starting at age 29 if you have a 30-pack-year history of smoking and currently smoke or have quit within the past 15 years. Fecal occult blood test (FOBT) of the stool. You may have this test every year starting at age 78. Flexible sigmoidoscopy or colonoscopy. You may have a sigmoidoscopy every 5 years or a colonoscopy every 10 years starting at age 61. Prostate cancer screening. Recommendations will vary depending on your family history and other risks. Hepatitis C blood test. Hepatitis B blood test. Sexually transmitted disease (STD) testing. Diabetes screening. This is done by checking your blood sugar (glucose) after you have not eaten for a while (fasting). You may have this done every 1-3 years. Abdominal aortic aneurysm (AAA) screening. You may need this if you are a current or former smoker. Osteoporosis. You may be screened starting at age 62 if you are at high risk. Talk with your health care provider about your test results, treatment options, and if necessary, the need for more tests. Vaccines  Your health care provider may recommend certain vaccines, such as: Influenza vaccine. This is recommended every year. Tetanus, diphtheria, and acellular pertussis (Tdap, Td) vaccine. You may need a Td booster every 10 years. Zoster vaccine. You may need this after age 19. Pneumococcal 13-valent conjugate (PCV13) vaccine. One dose is recommended after age 62. Pneumococcal polysaccharide (PPSV23) vaccine. One dose is recommended after age 90. Talk to your health care provider about which screenings and vaccines you need and how often you need them. This information is  not intended to replace advice given to you by your health care provider. Make sure  you discuss any questions you have with your health care provider. Document Released: 07/25/2015 Document Revised: 03/17/2016 Document Reviewed: 04/29/2015 Elsevier Interactive Patient Education  2017 [employer].  Fall Prevention in the Home Falls can cause injuries. They can happen to people of all ages. There are many things you can do to make your home safe and to help prevent falls. What can I do on the outside of my home? Regularly fix the edges of walkways and driveways and fix any cracks. Remove anything that might make you trip as you walk through a door, such as a raised step or threshold. Trim any bushes or trees on the path to your home. Use bright outdoor lighting. Clear any walking paths of anything that might make someone trip, such as rocks or tools. Regularly check to see if handrails are loose or broken. Make sure that both sides of any steps have handrails. Any raised decks and porches should have guardrails on the edges. Have any leaves, snow, or ice cleared regularly. Use sand or salt on walking paths during winter. Clean up any spills in your garage right away. This includes oil or grease spills. What can I do in the bathroom? Use night lights. Install grab bars by the toilet and in the tub and shower. Do not use towel bars as grab bars. Use non-skid mats or decals in the tub or shower. If you need to sit down in the shower, use a plastic, non-slip stool. Keep the floor dry. Clean up any water that spills on the floor as soon as it happens. Remove soap buildup in the tub or shower regularly. Attach bath mats securely with double-sided non-slip rug tape. Do not have throw rugs and other things on the floor that can make you trip. What can I do in the bedroom? Use night lights. Make sure that you have a light by your bed that is easy to reach. Do not use any sheets or blankets that are too big for your bed. They should not hang down onto the floor. Have a firm  chair that has side arms. You can use this for support while you get dressed. Do not have throw rugs and other things on the floor that can make you trip. What can I do in the kitchen? Clean up any spills right away. Avoid walking on wet floors. Keep items that you use a lot in easy-to-reach places. If you need to reach something above you, use a strong step stool that has a grab bar. Keep electrical cords out of the way. Do not use floor polish or wax that makes floors slippery. If you must use wax, use non-skid floor wax. Do not have throw rugs and other things on the floor that can make you trip. What can I do with my stairs? Do not leave any items on the stairs. Make sure that there are handrails on both sides of the stairs and use them. Fix handrails that are broken or loose. Make sure that handrails are as long as the stairways. Check any carpeting to make sure that it is firmly attached to the stairs. Fix any carpet that is loose or worn. Avoid having throw rugs at the top or bottom of the stairs. If you do have throw rugs, attach them to the floor with carpet tape. Make sure that you have a light switch at the top of the  stairs and the bottom of the stairs. If you do not have them, ask someone to add them for you. What else can I do to help prevent falls? Wear shoes that: Do not have high heels. Have rubber bottoms. Are comfortable and fit you well. Are closed at the toe. Do not wear sandals. If you use a stepladder: Make sure that it is fully opened. Do not climb a closed stepladder. Make sure that both sides of the stepladder are locked into place. Ask someone to hold it for you, if possible. Clearly mark and make sure that you can see: Any grab bars or handrails. First and last steps. Where the edge of each step is. Use tools that help you move around (mobility aids) if they are needed. These include: Canes. Walkers. Scooters. Crutches. Turn on the lights when you go  into a dark area. Replace any light bulbs as soon as they burn out. Set up your furniture so you have a clear path. Avoid moving your furniture around. If any of your floors are uneven, fix them. If there are any pets around you, be aware of where they are. Review your medicines with your doctor. Some medicines can make you feel dizzy. This can increase your chance of falling. Ask your doctor what other things that you can do to help prevent falls. This information is not intended to replace advice given to you by your health care provider. Make sure you discuss any questions you have with your health care provider. Document Released: 04/24/2009 Document Revised: 12/04/2015 Document Reviewed: 08/02/2014 Elsevier Interactive Patient Education  2017 [employer].

## 2023-05-05 ENCOUNTER — Ambulatory Visit (INDEPENDENT_AMBULATORY_CARE_PROVIDER_SITE_OTHER): Payer: Medicare HMO | Admitting: Family Medicine

## 2023-05-05 ENCOUNTER — Encounter: Payer: Self-pay | Admitting: Family Medicine

## 2023-05-05 VITALS — BP 118/72 | HR 64 | Temp 98.7°F | Ht 66.0 in | Wt 159.8 lb

## 2023-05-05 DIAGNOSIS — M25569 Pain in unspecified knee: Secondary | ICD-10-CM | POA: Diagnosis not present

## 2023-05-05 MED ORDER — TRAMADOL HCL 50 MG PO TABS: 50.0000 mg | ORAL_TABLET | Freq: Two times a day (BID) | ORAL | 1 refills | Status: DC | PRN

## 2023-05-05 NOTE — Patient Instructions (Signed)
Likely 3 issues.   Trochanteric bursitis.   Try icing for 5 minutes at a time.   Weak hip/thigh muscles  Try straight leg raises, rotated leg raises and side leg lifts.    Likely knee arthritis.   Try icing for 5 minutes at a time. Keep using liniment.   Update me as needed.  We can send you to PT if needed.  Take care.  Glad to see you.

## 2023-05-05 NOTE — Progress Notes (Signed)
Pain going up stairs.  Anterior knee pain, L>R.  Pain can radiate up to the L lateral hip.  No pain down to the feet.  No bruising, not puffy.  No ankle pain.  No popping or clicking.  Knee doesn't get stuck.    He had hand pain, joint changes in the MCP/IP joints.  More joint pain with weather changes.    Meds, vitals, and allergies reviewed.   ROS: Per HPI unless specifically indicated in ROS section   Nad Ncat L knee with normal ROM but patellar click on ROM.  Not bruised or puffy.  Not tender to palpation on the medial joint line but chronic joint line changes noted.   He has tenderness to the left greater trochanteric bursa and he has weakness on left hip abduction.  Able to bear weight.

## 2023-05-08 DIAGNOSIS — M25569 Pain in unspecified knee: Secondary | ICD-10-CM

## 2023-05-08 HISTORY — DX: Pain in unspecified knee: M25.569

## 2023-05-08 NOTE — Assessment & Plan Note (Signed)
Likely 3 issues, all potentially overlapping/contributing to his situation by affecting his gait. Likely trochanteric bursitis, knee osteoarthritis, and weaker hip abductors.  Okay to defer imaging at this point.  Discussed options.  Trochanteric bursitis.   Try icing for 5 minutes at a time.   Weak hip/thigh muscles  Try straight leg raises, rotated leg raises and side leg lifts.    Likely knee arthritis.   Try icing for 5 minutes at a time. Keep using topical liniment.   Update me as needed.  We can refer to PT if needed.

## 2023-05-09 ENCOUNTER — Other Ambulatory Visit: Payer: Self-pay | Admitting: Family Medicine

## 2023-05-09 ENCOUNTER — Other Ambulatory Visit (INDEPENDENT_AMBULATORY_CARE_PROVIDER_SITE_OTHER): Payer: Medicare HMO

## 2023-05-09 DIAGNOSIS — Z125 Encounter for screening for malignant neoplasm of prostate: Secondary | ICD-10-CM

## 2023-05-09 DIAGNOSIS — E785 Hyperlipidemia, unspecified: Secondary | ICD-10-CM

## 2023-05-09 LAB — COMPREHENSIVE METABOLIC PANEL
ALT: 18 U/L (ref 0–53)
AST: 19 U/L (ref 0–37)
Albumin: 4.4 g/dL (ref 3.5–5.2)
Alkaline Phosphatase: 62 U/L (ref 39–117)
BUN: 14 mg/dL (ref 6–23)
CO2: 32 meq/L (ref 19–32)
Calcium: 9.7 mg/dL (ref 8.4–10.5)
Chloride: 103 meq/L (ref 96–112)
Creatinine, Ser: 0.92 mg/dL (ref 0.40–1.50)
GFR: 79.62 mL/min (ref >60.00)
Glucose, Bld: 99 mg/dL (ref 70–99)
Potassium: 4.2 meq/L (ref 3.5–5.1)
Sodium: 141 meq/L (ref 135–145)
Total Bilirubin: 0.5 mg/dL (ref 0.2–1.2)
Total Protein: 6.8 g/dL (ref 6.0–8.3)

## 2023-05-09 LAB — LIPID PANEL
Cholesterol: 234 mg/dL — ABNORMAL HIGH (ref 0–200)
HDL: 46.5 mg/dL (ref >39.00)
LDL Cholesterol: 161 mg/dL — ABNORMAL HIGH (ref 0–99)
NonHDL: 187.66
Total CHOL/HDL Ratio: 5
Triglycerides: 133 mg/dL (ref 0.0–149.0)
VLDL: 26.6 mg/dL (ref 0.0–40.0)

## 2023-05-09 LAB — PSA, MEDICARE: PSA: 1.87 ng/mL (ref 0.10–4.00)

## 2023-05-19 ENCOUNTER — Ambulatory Visit (INDEPENDENT_AMBULATORY_CARE_PROVIDER_SITE_OTHER): Payer: Medicare HMO | Admitting: Family Medicine

## 2023-05-19 ENCOUNTER — Encounter: Payer: Self-pay | Admitting: Family Medicine

## 2023-05-19 VITALS — BP 132/70 | HR 60 | Temp 98.5°F | Ht 67.0 in | Wt 158.0 lb

## 2023-05-19 DIAGNOSIS — E785 Hyperlipidemia, unspecified: Secondary | ICD-10-CM

## 2023-05-19 DIAGNOSIS — M19049 Primary osteoarthritis, unspecified hand: Secondary | ICD-10-CM

## 2023-05-19 DIAGNOSIS — Z23 Encounter for immunization: Secondary | ICD-10-CM

## 2023-05-19 DIAGNOSIS — Z0000 Encounter for general adult medical examination without abnormal findings: Secondary | ICD-10-CM

## 2023-05-19 DIAGNOSIS — Z7189 Other specified counseling: Secondary | ICD-10-CM

## 2023-05-19 NOTE — Progress Notes (Signed)
D/w pt about using tramadol prn for hand pain.    GERD.  Off PPI in the meantime.  He cut out red med.  He increased water intake and modified his diet.  He can update me as needed.  Discussed.  Hyperlipidemia.  Statin intolerant.  Taking RYR for lipids.  Labs discussed with patient.  Memory d/w pt.  Occ lapses.  D/w pt about information overload and multitasking, and trying to limit both. Year, month, day. Wnl 3/3 attention.   Can do math.  Can read a watch.   2/3 recall.  Unclear is attention is affecting his recall, d/w pt.   Not getting lost, not leaving the stove on.  Not having trouble at grocery store.  D/w pt about observation for now.    Flu 2024 Shingles up-to-date PNA up-to-date Tetanus 2006, discussed with patient.  It may be cheaper the pharmacy. COVID-vaccine previously done Colonoscopy 2021 Prostate cancer screening 2024 Advance directive-wife designated if patient were incapacitated.  Meds, vitals, and allergies reviewed.   PMH and SH reviewed  ROS: Per HPI unless specifically indicated in ROS section   GEN: nad, alert and oriented HEENT: mucous membranes moist NECK: supple w/o LA CV: rrr. PULM: ctab, no inc wob ABD: soft, +bs EXT: no edema SKIN: no acute rash Chronic IP joint changes.

## 2023-05-19 NOTE — Patient Instructions (Addendum)
Multivitamin likely has enough vit E and vit C.  Flu shot today.  Take care.  Glad to see you. If you notice other memory changes, then let me know.

## 2023-05-22 NOTE — Assessment & Plan Note (Signed)
  Flu 2024 Shingles up-to-date PNA up-to-date Tetanus 2006, discussed with patient.  It may be cheaper the pharmacy. COVID-vaccine previously done Colonoscopy 2021 Prostate cancer screening 2024 Advance directive-wife designated if patient were incapacitated.  Memory d/w pt.  Occ lapses.  D/w pt about information overload and multitasking, and trying to limit both. Year, month, day. Wnl 3/3 attention.   Can do math.  Can read a watch.   2/3 recall.  Unclear is attention is affecting his recall, d/w pt.   Not getting lost, not leaving the stove on.  Not having trouble at grocery store.  D/w pt about observation for now.

## 2023-05-22 NOTE — Assessment & Plan Note (Signed)
Advance directive- wife designated if patient were incapacitated.  

## 2023-05-22 NOTE — Assessment & Plan Note (Signed)
Statin intolerant.  Taking RYR for lipids.   Continue as is.

## 2023-05-22 NOTE — Assessment & Plan Note (Signed)
Discussed using tramadol as needed.  He can update me as needed.

## 2023-05-22 NOTE — Assessment & Plan Note (Signed)
He is managing his symptoms with increased water intake and dietary modification.  Update me as needed.

## 2023-05-23 DIAGNOSIS — H01009 Unspecified blepharitis unspecified eye, unspecified eyelid: Secondary | ICD-10-CM | POA: Diagnosis not present

## 2023-05-23 DIAGNOSIS — B880 Other acariasis: Secondary | ICD-10-CM | POA: Diagnosis not present

## 2023-08-02 DIAGNOSIS — H01009 Unspecified blepharitis unspecified eye, unspecified eyelid: Secondary | ICD-10-CM | POA: Diagnosis not present

## 2023-08-02 DIAGNOSIS — B880 Other acariasis: Secondary | ICD-10-CM | POA: Diagnosis not present

## 2023-08-23 ENCOUNTER — Ambulatory Visit (INDEPENDENT_AMBULATORY_CARE_PROVIDER_SITE_OTHER): Payer: PPO | Admitting: Family Medicine

## 2023-08-23 ENCOUNTER — Encounter: Payer: Self-pay | Admitting: Family Medicine

## 2023-08-23 ENCOUNTER — Ambulatory Visit: Payer: Self-pay | Admitting: Family Medicine

## 2023-08-23 VITALS — BP 152/72 | HR 91 | Temp 99.8°F | Ht 67.0 in | Wt 165.0 lb

## 2023-08-23 DIAGNOSIS — R051 Acute cough: Secondary | ICD-10-CM | POA: Diagnosis not present

## 2023-08-23 DIAGNOSIS — J101 Influenza due to other identified influenza virus with other respiratory manifestations: Secondary | ICD-10-CM | POA: Diagnosis not present

## 2023-08-23 LAB — POC COVID19 BINAXNOW: SARS Coronavirus 2 Ag: NEGATIVE

## 2023-08-23 LAB — POC INFLUENZA A&B (BINAX/QUICKVUE)
Influenza A, POC: POSITIVE — AB
Influenza B, POC: NEGATIVE

## 2023-08-23 MED ORDER — OSELTAMIVIR PHOSPHATE 75 MG PO CAPS: 75.0000 mg | ORAL_CAPSULE | Freq: Two times a day (BID) | ORAL | 0 refills | Status: DC

## 2023-08-23 MED ORDER — BENZONATATE 200 MG PO CAPS: 200.0000 mg | ORAL_CAPSULE | Freq: Two times a day (BID) | ORAL | 0 refills | Status: DC | PRN

## 2023-08-23 NOTE — Telephone Encounter (Signed)
  Chief Complaint: cough Symptoms: productive cough, body aches, headache, chills Frequency: x 3 days, cough is intermittent Pertinent Negatives: Patient denies fever, runny  nose, wheezing, chest pain. Disposition: [] ED /[] Urgent Care (no appt availability in office) / [x] Appointment(In office/virtual)/ []  [facility]/ [] Home Care/ [] Refused Recommended Disposition /[] [facility]/ []  Follow-up with PCP Additional Notes: Wife states at night patient is taking Muccinex  during the day and Nyquil at night. Wife states they would like to come in for an office visit to rule out bronchitis and/or pneumonia and that they feel like OTC medications have not been helping.  Copied from CRM [id number]. Topic: Clinical - Red Word Triage >> Aug 23, 2023 11:26 AM Orinda Kenner C wrote: Red Word that prompted transfer to Nurse Triage: Patient's wife Doree Fudge [phone] states patient is coughing phelgm light green, trouble breathing, headaches, body aches and pain in shoulders and back. Patient denies fever or dizziness. Reason for Disposition  Cough  Answer Assessment - Initial Assessment Questions 1. ONSET: "When did the cough begin?"      X 3 days. 2. SEVERITY: "How bad is the cough today?"      Occasional cough, "every now and then". 3. SPUTUM: "Describe the color of your sputum" (none, dry cough; clear, white, yellow, green)     Light green. 4. HEMOPTYSIS: "Are you coughing up any blood?" If so ask: "How much?" (flecks, streaks, tablespoons, etc.)     Denies. 5. DIFFICULTY BREATHING: "Are you having difficulty breathing?" If Yes, ask: "How bad is it?" (e.g., mild, moderate, severe)    - MILD: No SOB at rest, mild SOB with walking, speaks normally in sentences, can lie down, no retractions, pulse < 100.    - MODERATE: SOB at rest, SOB with minimal exertion and prefers to sit, cannot lie down flat, speaks in phrases, mild retractions, audible wheezing, pulse 100-120.    - SEVERE: Very SOB at  rest, speaks in single words, struggling to breathe, sitting hunched forward, retractions, pulse > 120      Only when he coughs he feels SOB, wife states breathing is good right now at rest. 6. FEVER: "Do you have a fever?" If Yes, ask: "What is your temperature, how was it measured, and when did it start?"     Denies. 7. CARDIAC HISTORY: "Do you have any history of heart disease?" (e.g., heart attack, congestive heart failure)      Denies. 8. LUNG HISTORY: "Do you have any history of lung disease?"  (e.g., pulmonary embolus, asthma, emphysema)     Denies. 9. PE RISK FACTORS: "Do you have a history of blood clots?" (or: recent major surgery, recent prolonged travel, bedridden)     Denies. 10. OTHER SYMPTOMS: "Do you have any other symptoms?" (e.g., runny nose, wheezing, chest pain)       Body aches, headache, chills  11. TRAVEL: "Have you traveled out of the country in the last month?" (e.g., travel history, exposures)       Denies any travel or known exposure.  Protocols used: Cough - Acute Productive-A-AH

## 2023-08-23 NOTE — Progress Notes (Signed)
Patient ID: Kyle Sanchez, male    DOB: 06-26-1946, 78 y.o.   MRN: [id number]  This visit was conducted in person.  BP (!) 152/72 (BP Location: Left Arm, Patient Position: Sitting, Cuff Size: Large)   Pulse 91   Temp 99.8 F (37.7 C) (Temporal)   Ht 5\' 7"  (1.702 m)   Wt 165 lb (74.8 kg)   SpO2 98%   BMI 25.84 kg/m    CC:  Chief Complaint  Patient presents with   Cough    X 3 days   Headache   Generalized Body Aches   Chills    Subjective:   HPI: Kyle Sanchez is a 78 y.o. male patient of Dr. Lianne Bushy with history of smoking presenting on 08/23/2023 for Cough (X 3 days), Headache, Generalized Body Aches, and Chills   Date of onset:  3 days Initial symptoms included  ST Symptoms progressed to cough, shoulder pain  Cough keeping him up at night  Occ SOB with coughing fit.  No wheeze  Low grade temp. Chills   Drinking lots of fluids.    Sick contacts: none. COVID testing:   none     He has tried to treat with  OTC meds.     No history of chronic lung disease such as asthma or COPD.  Remote former smoker age [age]       Relevant past medical, surgical, family and social history reviewed and updated as indicated. Interim medical history since our last visit reviewed. Allergies and medications reviewed and updated. Outpatient Medications Prior to Visit  Medication Sig Dispense Refill   aspirin EC 81 MG tablet Take 81 mg by mouth daily.     calcium citrate (CALCITRATE - DOSED IN MG ELEMENTAL CALCIUM) 950 (200 Ca) MG tablet Take 200 mg of elemental calcium by mouth daily. With zinc and magnesium.     Cholecalciferol 25 MCG (1000 UT) capsule Take 4 capsules (4,000 Units total) by mouth daily.     clobetasol cream (TEMOVATE) 0.05 % Apply 1 application topically 2 (two) times daily as needed. 30 g 1   Ginkgo Biloba 120 MG TABS Take by mouth daily.     glucosamine-chondroitin 500-400 MG tablet Take 1 tablet by mouth daily.     lactobacillus acidophilus (BACID) TABS  tablet Take 1 tablet by mouth daily.     Multiple Vitamin (MULTIVITAMIN) capsule Take 1 capsule by mouth daily.     Omega-3 Fatty Acids (RA FISH OIL) 1400 MG CPDR Take by mouth daily.     polyethylene glycol (MIRALAX / GLYCOLAX) 17 g packet Take 17 g by mouth daily.     Red Yeast Rice Extract (RED YEAST RICE PO) Take 1 tablet by mouth daily.     traMADol (ULTRAM) 50 MG tablet Take 1 tablet (50 mg total) by mouth every 12 (twelve) hours as needed (for hand pain). 60 tablet 1   Turmeric 500 MG CAPS Take 500 mg by mouth in the morning and at bedtime.     No facility-administered medications prior to visit.     Per HPI unless specifically indicated in ROS section below Review of Systems  Constitutional:  Positive for fatigue. Negative for fever.  HENT:  Negative for ear pain.   Eyes:  Negative for pain.  Respiratory:  Positive for cough. Negative for shortness of breath.   Cardiovascular:  Negative for chest pain, palpitations and leg swelling.  Gastrointestinal:  Negative for abdominal pain.  Genitourinary:  Negative for dysuria.  Musculoskeletal:  Negative for arthralgias.  Neurological:  Negative for syncope, light-headedness and headaches.  Psychiatric/Behavioral:  Negative for dysphoric mood.    Objective:  BP (!) 152/72 (BP Location: Left Arm, Patient Position: Sitting, Cuff Size: Large)   Pulse 91   Temp 99.8 F (37.7 C) (Temporal)   Ht 5\' 7"  (1.702 m)   Wt 165 lb (74.8 kg)   SpO2 98%   BMI 25.84 kg/m   Wt Readings from Last 3 Encounters:  08/23/23 165 lb (74.8 kg)  05/19/23 158 lb (71.7 kg)  05/05/23 159 lb 12.8 oz (72.5 kg)      Physical Exam Constitutional:      Appearance: He is well-developed.  HENT:     Head: Normocephalic.     Right Ear: Hearing normal. No middle ear effusion.     Left Ear: Hearing normal.  No middle ear effusion.     Nose: Nose normal.  Neck:     Thyroid: No thyroid mass or thyromegaly.     Vascular: No carotid bruit.     Trachea: Trachea  normal.  Cardiovascular:     Rate and Rhythm: Normal rate and regular rhythm.     Pulses: Normal pulses.     Heart sounds: Heart sounds not distant. No murmur heard.    No friction rub. No gallop.     Comments: No peripheral edema Pulmonary:     Effort: Pulmonary effort is normal. No respiratory distress.     Breath sounds: Normal breath sounds.  Skin:    General: Skin is warm and dry.     Findings: No rash.  Psychiatric:        Speech: Speech normal.        Behavior: Behavior normal.        Thought Content: Thought content normal.       Results for orders placed or performed in visit on 08/23/23  POC COVID-19   Collection Time: 08/23/23  2:33 PM  Result Value Ref Range   SARS Coronavirus 2 Ag Negative Negative  POC Influenza A&B (Binax test)   Collection Time: 08/23/23  2:33 PM  Result Value Ref Range   Influenza A, POC Positive (A) Negative   Influenza B, POC Negative Negative    Assessment and Plan  Acute cough -     POC COVID-19 BinaxNow -     POC Influenza A&B(BINAX/QUICKVUE)  Influenza A Assessment & Plan: Discussed symptomatic care.  Hydration, rest. Call if SOB, cough worsening or prolongued fever. Reviewed flu timeline. S Age and smoking history put him  at risk for complications , so we decided to use of tamiflu. Pt agreed. Discussed family prophylaxis. He was advised to not return to work/be in public until 24 hour after fever resolved on no antipyretics.  Can use benzonatate  for cough.   Return and ER precautions provided.   Other orders -     Oseltamivir Phosphate; Take 1 capsule (75 mg total) by mouth 2 (two) times daily.  Dispense: 10 capsule; Refill: 0 -     Benzonatate; Take 1 capsule (200 mg total) by mouth 2 (two) times daily as needed for cough.  Dispense: 20 capsule; Refill: 0    No follow-ups on file.   Kerby Nora, MD

## 2023-08-23 NOTE — Telephone Encounter (Signed)
Has OV today.  Thanks.

## 2023-08-23 NOTE — Assessment & Plan Note (Signed)
Discussed symptomatic care.  Hydration, rest. Call if SOB, cough worsening or prolongued fever. Reviewed flu timeline. S Age and smoking history put him  at risk for complications , so we decided to use of tamiflu. Pt agreed. Discussed family prophylaxis. He was advised to not return to work/be in public until 24 hour after fever resolved on no antipyretics.  Can use benzonatate  for cough.   Return and ER precautions provided.

## 2023-10-20 ENCOUNTER — Ambulatory Visit: Admitting: Family Medicine

## 2023-10-20 ENCOUNTER — Encounter: Payer: Self-pay | Admitting: Family Medicine

## 2023-10-20 VITALS — BP 130/80 | HR 64 | Temp 98.6°F | Ht 67.0 in | Wt 161.8 lb

## 2023-10-20 DIAGNOSIS — M2550 Pain in unspecified joint: Secondary | ICD-10-CM | POA: Diagnosis not present

## 2023-10-20 DIAGNOSIS — R413 Other amnesia: Secondary | ICD-10-CM

## 2023-10-20 LAB — VITAMIN B12: Vitamin B-12: 1289 pg/mL — ABNORMAL HIGH (ref 211–911)

## 2023-10-20 LAB — COMPREHENSIVE METABOLIC PANEL WITH GFR
ALT: 18 U/L (ref 0–53)
AST: 20 U/L (ref 0–37)
Albumin: 4.7 g/dL (ref 3.5–5.2)
Alkaline Phosphatase: 71 U/L (ref 39–117)
BUN: 14 mg/dL (ref 6–23)
CO2: 31 meq/L (ref 19–32)
Calcium: 10 mg/dL (ref 8.4–10.5)
Chloride: 103 meq/L (ref 96–112)
Creatinine, Ser: 0.86 mg/dL (ref 0.40–1.50)
GFR: 82.62 mL/min (ref >60.00)
Glucose, Bld: 98 mg/dL (ref 70–99)
Potassium: 4.4 meq/L (ref 3.5–5.1)
Sodium: 140 meq/L (ref 135–145)
Total Bilirubin: 0.5 mg/dL (ref 0.2–1.2)
Total Protein: 7.5 g/dL (ref 6.0–8.3)

## 2023-10-20 LAB — CBC WITH DIFFERENTIAL/PLATELET
Basophils Absolute: 0.1 10*3/uL (ref 0.0–0.1)
Basophils Relative: 1.7 % (ref 0.0–3.0)
Eosinophils Absolute: 0.6 10*3/uL (ref 0.0–0.7)
Eosinophils Relative: 10 % — ABNORMAL HIGH (ref 0.0–5.0)
HCT: 45 % (ref 39.0–52.0)
Hemoglobin: 14.9 g/dL (ref 13.0–17.0)
Lymphocytes Relative: 27 % (ref 12.0–46.0)
Lymphs Abs: 1.7 10*3/uL (ref 0.7–4.0)
MCHC: 33 g/dL (ref 30.0–36.0)
MCV: 90.4 fl (ref 78.0–100.0)
Monocytes Absolute: 0.6 10*3/uL (ref 0.1–1.0)
Monocytes Relative: 9.4 % (ref 3.0–12.0)
Neutro Abs: 3.3 10*3/uL (ref 1.4–7.7)
Neutrophils Relative %: 51.9 % (ref 43.0–77.0)
Platelets: 237 10*3/uL (ref 150.0–400.0)
RBC: 4.98 Mil/uL (ref 4.22–5.81)
RDW: 15 % (ref 11.5–15.5)
WBC: 6.4 10*3/uL (ref 4.0–10.5)

## 2023-10-20 LAB — TSH: TSH: 1.88 u[IU]/mL (ref 0.35–5.50)

## 2023-10-20 NOTE — Progress Notes (Signed)
 L hand pain.  He has chronic IP joint changes.  He was asking about trying turmeric twice a day.  That should be reasonable to try.  Memory changes.  He noted some changes.  Occ lapses.  He is writing more down, depending on his wife. FH noted, brother.  Not getting lost, no red flag events.  Some slowing of recall with items on a list, ie details to take care of at home.  He has noted changes in the last year.  Not an acute change.  Meds, vitals, and allergies reviewed.   ROS: Per HPI unless specifically indicated in ROS section   GEN: nad, alert and oriented HEENT: ncat NECK: supple w/o LA CV: rrr.  PULM: ctab, no inc wob EXT: no edema SKIN: Well-perfused Chronic IP joint changes noted bilaterally. CN 2-12 wnl B, S/S wnl x4  MMSE 26 out of 30.  -3 for orientation. He was off by 1 on the month and the day of the month.  He also cannot recall the town where the clinic is located.  -1 for recall.  -4 overall.  30 minutes were devoted to patient care in this encounter (this includes time spent reviewing the patient's file/history, interviewing and examining the patient, counseling/reviewing plan with patient).

## 2023-10-20 NOTE — Patient Instructions (Signed)
 Go to the lab on the way out.   If you have mychart we'll likely use that to update you.    Take care.  Glad to see you.  If your labs are normal, we'll likely get the head CT set up.  We'll be in touch.

## 2023-10-23 ENCOUNTER — Encounter: Payer: Self-pay | Admitting: Family Medicine

## 2023-10-23 DIAGNOSIS — R413 Other amnesia: Secondary | ICD-10-CM | POA: Insufficient documentation

## 2023-10-23 DIAGNOSIS — R4189 Other symptoms and signs involving cognitive functions and awareness: Secondary | ICD-10-CM | POA: Insufficient documentation

## 2023-10-23 NOTE — Assessment & Plan Note (Signed)
 Reasonable to try turmeric twice a day and then update me as needed.

## 2023-10-23 NOTE — Assessment & Plan Note (Addendum)
  MMSE 26 out of 30.  -3 for orientation. He was off by 1 on the month and the day of the month.  He also cannot recall the town where the clinic is located.  -1 for recall.  -4 overall.  Discussed differential.  Check labs today for reversible causes and if labs are unremarkable then likely proceed with CT head.  Rationale discussed with patient and he agrees.

## 2023-10-28 ENCOUNTER — Ambulatory Visit
Admission: RE | Admit: 2023-10-28 | Discharge: 2023-10-28 | Disposition: A | Discharge status: Home / Self Care | Source: Ambulatory Visit | Attending: Family Medicine | Admitting: Family Medicine

## 2023-10-28 DIAGNOSIS — R413 Other amnesia: Secondary | ICD-10-CM | POA: Diagnosis not present

## 2023-11-22 NOTE — Telephone Encounter (Signed)
 I don't see results yet. Do you want me to call and follow up for results?

## 2023-11-23 ENCOUNTER — Ambulatory Visit: Payer: Self-pay | Admitting: Family Medicine

## 2023-11-23 DIAGNOSIS — R413 Other amnesia: Secondary | ICD-10-CM

## 2023-11-30 ENCOUNTER — Encounter: Payer: Self-pay | Admitting: Physician Assistant

## 2024-01-15 NOTE — Progress Notes (Signed)
 Assessment/Plan:     Kyle Sanchez is a very pleasant 78 y.o. year old RH male with a history of hypertension, hyperlipidemia, GERD, mild IBS, anxiety, seen today for evaluation of memory loss. MoCA today is 16/30. Etiology unclear although he has strong family history of Alzheimer's disease. Patient is able to participate on ADLs and to drive without difficulties. Mood is good.     Memory Impairment of unclear etiology   MRI brain without contrast to assess for underlying structural abnormality and assess vascular load  Neurocognitive testing to further evaluate cognitive concerns and determine other underlying cause of memory changes, including potential contribution from sleep, anxiety, attention, or depression among others  Pending on the results above will entertain starting antidementia medication Recommend good control of cardiovascular risk factors.   Continue to control mood as per PCP Fol[place]ow up in 6 months   Subjective:    The patient is here alone    How long did patient have memory difficulties?  For the last year.  Patient reports some difficulty remembering new information, recent conversations, names.  He has to write down to remember.  Notices more difficulties with multitasking. He is concerned because he has strong family of dementia  repeats oneself?  Endorsed Disoriented when walking into a room? Denies    Leaving objects in unusual places?  Denies.   Wandering behavior? Denies.   Any personality changes, or depression, anxiety? Denies   Hallucinations or paranoia? Denies.   Seizures? Denies.    Any sleep changes?  Sleeps well. Denies frequent nightmares or dream reenactment, other REM behavior or sleepwalking   Sleep apnea? Denies.   Any hygiene concerns?  Denies.   Independent of bathing and dressing? Endorsed  Does the patient need help with medications? I am on no medicines   Who is in charge of the finances? Wife is in charge, she is the brain     Any  changes in appetite?   Denies.     Patient have trouble swallowing?  Denies.   Does the patient cook? Yes, denies forgetting common recipes or kitchen accidents   Any headaches?  Denies.   Chronic pain?  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 Ambulates with difficulty? Denies. Walks 3 miles a day, treadmill.  Recent falls or head injuries? Denies.     Vision changes?  Denies any new issues.    Any strokelike symptoms? Denies.   Any tremors? Denies.   Any anosmia? Denies.   Any incontinence of urine? Denies.   Any bowel dysfunction? Denies.      Patient lives with Wife    History of heavy alcohol intake? Denies.   History of heavy tobacco use? Denies.   Family history of dementia?  Mother and 2 siblings have dementia of AD. His brother has LBD Does patient drive? Yes, denies getting lost.   Retired, he Patent examiner, at [employer].     Allergies  Allergen Reactions   Nortriptyline      sedation   Simvastatin      REACTION: myalgias   Sucralfate      Dry mouth, aches    Current Outpatient Medications  Medication Instructions   aspirin EC 81 mg, Daily   calcium citrate (CALCITRATE - DOSED IN MG ELEMENTAL CALCIUM) 950 (200 Ca) MG tablet 200 mg of elemental calcium, Daily   Cholecalciferol  4,000 Units, Oral, Daily   clobetasol  cream (TEMOVATE ) 0.05 % 1 application , Topical, 2 times  daily PRN   Ginkgo Biloba 120 MG TABS Daily   glucosamine-chondroitin 500-400 MG tablet 1 tablet, Daily   lactobacillus acidophilus (BACID) TABS tablet 1 tablet, Daily   Multiple Vitamin (MULTIVITAMIN) capsule 1 capsule, Daily   Omega-3 Fatty Acids (RA FISH OIL) 1400 MG CPDR Daily   polyethylene glycol (MIRALAX / GLYCOLAX) 17 g, Daily   Red Yeast Rice Extract (RED YEAST RICE PO) 1 tablet, Daily   traMADol  (ULTRAM ) 50 mg, Oral, Every 12 hours PRN   Turmeric 500 mg, 2 times daily     VITALS:   Vitals:   01/19/24 1333  BP: 139/82  Pulse: 78  Resp: 20  SpO2:  99%  Weight: 165 lb (74.8 kg)  Height: 5' 6 (1.676 m)     Physical Exam  :    01/19/2024    2:00 PM  Montreal Cognitive Assessment   Visuospatial/ Executive (0/5) 1  Naming (0/3) 3  Attention: Read list of digits (0/2) 2  Attention: Read list of letters (0/1) 1  Attention: Serial 7 subtraction starting at 100 (0/3) 1  Language: Repeat phrase (0/2) 0  Language : Fluency (0/1) 1  Abstraction (0/2) 0  Delayed Recall (0/5) 1  Orientation (0/6) 5  Total 15  Adjusted Score (based on education) 16        No data to display             HEENT:  Normocephalic, atraumatic.  The superficial temporal arteries are without ropiness or tenderness. Cardiovascular: Regular rate and rhythm. Lungs: Clear to auscultation bilaterally. Neck: There are no carotid bruits noted bilaterally. Orientation:  Alert and oriented to person, place and not to time. No aphasia or dysarthria. Fund of knowledge is appropriate. Recent and remote memory impaired.  Attention and concentration are reduced .  Able to name objects and unable to repeat phrases.   Delayed recall  1/5 .  Cranial nerves: There is good facial symmetry. Extraocular muscles are intact and visual fields are full to confrontational testing. Speech is fluent and clear. No tongue deviation. Hearing is intact to conversational tone.  Tone: Tone is good throughout. Sensation: Sensation is intact to light touch.  Vibration is intact at the bilateral big toe.  Coordination: The patient has no difficulty with RAM's or FNF bilaterally. Normal finger to nose  Motor: Strength is 5/5 in the bilateral upper and lower extremities. There is no pronator drift. There are no fasciculations noted. DTR's: Deep tendon reflexes are 2/4 bilaterally. Gait and Station: The patient is able to ambulate without difficulty. Gait is cautious and narrow. Stride length is normal.        Thank you for allowing us  the opportunity to participate in the care of this nice  patient. Please do not hesitate to contact us  for any questions or concerns.   Total time spent on today's visit was 40 minutes dedicated to this patient today, preparing to see patient, examining the patient, ordering tests and/or medications and counseling the patient, documenting clinical information in the EHR or other health record, independently interpreting results and communicating results to the patient/family, discussing treatment and goals, answering patient's questions and coordinating care.  Cc:  Cleatus Arlyss RAMAN, MD   Camie Sevin 01/19/2024 2:20 PM

## 2024-01-19 ENCOUNTER — Encounter

## 2024-01-19 ENCOUNTER — Ambulatory Visit: Admitting: Physician Assistant

## 2024-01-19 ENCOUNTER — Encounter: Payer: Self-pay | Admitting: Physician Assistant

## 2024-01-19 VITALS — BP 139/82 | HR 78 | Resp 20 | Ht 66.0 in | Wt 165.0 lb

## 2024-01-19 DIAGNOSIS — R413 Other amnesia: Secondary | ICD-10-CM

## 2024-01-19 NOTE — Patient Instructions (Addendum)
 It was a pleasure to see you today at our office.   Recommendations:  Neurocognitive evaluation at our office   MRI of the brain, the radiology office will call you to arrange you appointment  [phone] Follow up after neuropsych Recommend visiting the website :  Dementia [facility] to better understand some behaviors related to memory loss.  For psychiatric meds, mood meds: Please have your primary care physician manage these medications.  If you have any severe symptoms of a stroke, or other severe issues such as confusion,severe chills or fever, etc call 911 or go to the ER as you may need to be evaluated further For guidance regarding [facility] and if placement were needed at the facility, contact Social Worker tel: [phone]  For assessment of decision of mental capacity and competency:  Call Dr. Rosaline Nine, geriatric psychiatrist at [phone] Counseling regarding caregiver distress, including caregiver depression, anxiety and issues regarding community resources, adult day care programs, adult living facilities, or memory care questions:  please contact your  Primary Doctor's Social Worker   FOR Memory  decline, memory medications: Call our office [phone]    [email or web address]   RECOMMENDATIONS FOR ALL PATIENTS WITH MEMORY PROBLEMS: 1. Continue to exercise (Recommend 30 minutes of walking everyday, or 3 hours every week) 2. Increase social interactions - continue going to [place] and enjoy social gatherings with friends and family 3. Eat healthy, avoid fried foods and eat more fruits and vegetables 4. Maintain adequate blood pressure, blood sugar, and blood cholesterol level. Reducing the risk of stroke and cardiovascular disease also helps promoting better memory. 5. Avoid stressful situations. Live a simple life and avoid aggravations. Organize your time and prepare for the next day in  anticipation. 6. Sleep well, avoid any interruptions of sleep and avoid any distractions in the bedroom that may interfere with adequate sleep quality 7. Avoid sugar, avoid sweets as there is a strong link between excessive sugar intake, diabetes, and cognitive impairment We discussed the Mediterranean diet, which has been shown to help patients reduce the risk of progressive memory disorders and reduces cardiovascular risk. This includes eating fish, eat fruits and green leafy vegetables, nuts like almonds and hazelnuts, walnuts, and also use olive oil. Avoid fast foods and fried foods as much as possible. Avoid sweets and sugar as sugar use has been linked to worsening of memory function.  There is always a concern of gradual progression of memory problems. If this is the case, then we may need to adjust level of care according to patient needs. Support, both to the patient and caregiver, should then be put into place.      You have been referred for a neuropsychological evaluation (i.e., evaluation of memory and thinking abilities). Please bring someone with you to this appointment if possible, as it is helpful for the doctor to hear from both you and another adult who knows you well. Please bring eyeglasses and hearing aids if you wear them.    The evaluation will take approximately 3 hours and has two parts:   The first part is a clinical interview with the neuropsychologist (Dr. Richie or Dr. Gayland). During the interview, the neuropsychologist will speak with you and the individual you brought to the appointment.    The second part of the evaluation is testing with the doctor's technician Neal or Luke). During the testing, the technician will ask you to remember different types of material, solve problems, and answer some questionnaires. Your family  member will not be present for this portion of the evaluation.   Please note: We must reserve several hours of the neuropsychologist's time and  the psychometrician's time for your evaluation appointment. As such, there is a No-Show fee of $100. If you are unable to attend any of your appointments, please contact our office as soon as possible to reschedule.      DRIVING: Regarding driving, in patients with progressive memory problems, driving will be impaired. We advise to have someone else do the driving if trouble finding directions or if minor accidents are reported. Independent driving assessment is available to determine safety of driving.   If you are interested in the driving assessment, you can contact the following:  [facility] [employer] [facility] in [place] [phone]  [facility] [phone]  [facility] [phone]  [facility] [phone] or [phone]   FALL PRECAUTIONS: Be cautious when walking. Scan the area for obstacles that may increase the risk of trips and falls. When getting up in the mornings, sit up at the edge of the bed for a few minutes before getting out of bed. Consider elevating the bed at the head end to avoid drop of blood pressure when getting up. Walk always in a well-lit room (use night lights in the walls). Avoid area rugs or power cords from appliances in the middle of the walkways. Use a walker or a cane if necessary and consider physical therapy for balance exercise. Get your eyesight checked regularly.  FINANCIAL OVERSIGHT: Supervision, especially oversight when making financial decisions or transactions is also recommended.  HOME SAFETY: Consider the safety of the kitchen when operating appliances like stoves, microwave oven, and blender. Consider having supervision and share cooking responsibilities until no longer able to participate in those. Accidents with firearms and other hazards in the house should be identified and addressed as well.   ABILITY TO BE LEFT ALONE: If patient is unable to contact 911 operator, consider using LifeLine, or when the  need is there, arrange for someone to stay with patients. Smoking is a fire hazard, consider supervision or cessation. Risk of wandering should be assessed by caregiver and if detected at any point, supervision and safe proof recommendations should be instituted.  MEDICATION SUPERVISION: Inability to self-administer medication needs to be constantly addressed. Implement a mechanism to ensure safe administration of the medications.      Mediterranean Diet A Mediterranean diet refers to food and lifestyle choices that are based on the traditions of countries located on the [employer] [place]. This way of eating has been shown to help prevent certain conditions and improve outcomes for people who have chronic diseases, like kidney disease and heart disease. What are tips for following this plan? Lifestyle  Cook and eat meals together with your family, when possible. Drink enough fluid to keep your urine clear or pale yellow. Be physically active every day. This includes: Aerobic exercise like running or swimming. Leisure activities like gardening, walking, or housework. Get 7-8 hours of sleep each night. If recommended by your health care provider, drink red wine in moderation. This means 1 glass a day for nonpregnant women and 2 glasses a day for men. A glass of wine equals 5 oz (150 mL). Reading food labels  Check the serving size of packaged foods. For foods such as rice and pasta, the serving size refers to the amount of cooked product, not dry. Check the total fat in packaged foods. Avoid foods that have saturated fat or trans  fats. Check the ingredients list for added sugars, such as corn syrup. Shopping  At the grocery store, buy most of your food from the areas near the walls of the store. This includes: Fresh fruits and vegetables (produce). Grains, beans, nuts, and seeds. Some of these may be available in unpackaged forms or large amounts (in bulk). Fresh seafood. Poultry and  eggs. Low-fat dairy products. Buy whole ingredients instead of prepackaged foods. Buy fresh fruits and vegetables in-season from local farmers markets. Buy frozen fruits and vegetables in resealable bags. If you do not have access to quality fresh seafood, buy precooked frozen shrimp or canned fish, such as tuna, salmon, or sardines. Buy small amounts of raw or cooked vegetables, salads, or olives from the deli or salad bar at your store. Stock your pantry so you always have certain foods on hand, such as olive oil, canned tuna, canned tomatoes, rice, pasta, and beans. Cooking  Cook foods with extra-virgin olive oil instead of using butter or other vegetable oils. Have meat as a side dish, and have vegetables or grains as your main dish. This means having meat in small portions or adding small amounts of meat to foods like pasta or stew. Use beans or vegetables instead of meat in common dishes like chili or lasagna. Experiment with different cooking methods. Try roasting or broiling vegetables instead of steaming or sauteing them. Add frozen vegetables to soups, stews, pasta, or rice. Add nuts or seeds for added healthy fat at each meal. You can add these to yogurt, salads, or vegetable dishes. Marinate fish or vegetables using olive oil, lemon juice, garlic, and fresh herbs. Meal planning  Plan to eat 1 vegetarian meal one day each week. Try to work up to 2 vegetarian meals, if possible. Eat seafood 2 or more times a week. Have healthy snacks readily available, such as: Vegetable sticks with hummus. Greek yogurt. Fruit and nut trail mix. Eat balanced meals throughout the week. This includes: Fruit: 2-3 servings a day Vegetables: 4-5 servings a day Low-fat dairy: 2 servings a day Fish, poultry, or lean meat: 1 serving a day Beans and legumes: 2 or more servings a week Nuts and seeds: 1-2 servings a day Whole grains: 6-8 servings a day Extra-virgin olive oil: 3-4 servings a day Limit  red meat and sweets to only a few servings a month What are my food choices? Mediterranean diet Recommended Grains: Whole-grain pasta. Brown rice. Bulgar wheat. Polenta. Couscous. Whole-wheat bread. [place] Made[place]. Vegetables: Artichokes. Beets. Broccoli. Cabbage. Carrots. Eggplant. Green beans. Chard. Kale. Spinach. Onions. Leeks. Peas. Squash. Tomatoes. Peppers. Radishes. Fruits: Apples. Apricots. Avocado. Berries. Bananas. Cherries. Dates. Figs. Grapes. Lemons. Melon. Oranges. Peaches. Plums. Pomegranate. Meats and other protein foods: Beans. Almonds. Sunflower seeds. Pine nuts. Peanuts. Cod. Salmon. Scallops. Shrimp. Tuna. Tilapia. Clams. Oysters. Eggs. Dairy: Low-fat milk. Cheese. Greek yogurt. Beverages: Water. Red wine. Herbal tea. Fats and oils: Extra virgin olive oil. Avocado oil. Grape seed oil. Sweets and desserts: [place] yogurt with honey. Baked apples. Poached pears. Trail mix. Seasoning and other foods: Basil. Cilantro. Coriander. Cumin. Mint. Parsley. Sage. Rosemary. Tarragon. Garlic. Oregano. Thyme. Pepper. Balsalmic vinegar. Tahini. Hummus. Tomato sauce. Olives. Mushrooms. Limit these Grains: Prepackaged pasta or rice dishes. Prepackaged cereal with added sugar. Vegetables: Deep fried potatoes (french fries). Fruits: Fruit canned in syrup. Meats and other protein foods: Beef. Pork. Lamb. Poultry with skin. Hot dogs. [place]. Dairy: Ice cream. Sour cream. Whole milk. Beverages: Juice. Sugar-sweetened soft drinks. Beer. Liquor and spirits. Fats and  oils: Butter. Canola oil. Vegetable oil. Beef fat (tallow). Lard. Sweets and desserts: Cookies. Cakes. Pies. Candy. Seasoning and other foods: Mayonnaise. Premade sauces and marinades. The items listed may not be a complete list. Talk with your dietitian about what dietary choices are right for you. Summary The Mediterranean diet includes both food and lifestyle choices. Eat a variety of fresh fruits and vegetables, beans, nuts,  seeds, and whole grains. Limit the amount of red meat and sweets that you eat. Talk with your health care provider about whether it is safe for you to drink red wine in moderation. This means 1 glass a day for nonpregnant women and 2 glasses a day for men. A glass of wine equals 5 oz (150 mL). This information is not intended to replace advice given to you by your health care provider. Make sure you discuss any questions you have with your health care provider. Document Released: 02/19/2016 Document Revised: 03/23/2016 Document Reviewed: 02/19/2016 Elsevier Interactive Patient Education  2017 [employer].

## 2024-01-20 ENCOUNTER — Encounter: Payer: Self-pay | Admitting: Psychology

## 2024-01-23 ENCOUNTER — Ambulatory Visit: Payer: Self-pay

## 2024-01-23 ENCOUNTER — Encounter: Payer: Self-pay | Admitting: Psychology

## 2024-01-23 ENCOUNTER — Ambulatory Visit: Admitting: Psychology

## 2024-01-23 DIAGNOSIS — G3184 Mild cognitive impairment, so stated: Secondary | ICD-10-CM | POA: Diagnosis not present

## 2024-01-23 NOTE — Progress Notes (Signed)
 NEUROPSYCHOLOGICAL EVALUATION [place]. [facility] Department of Neurology  Date of Evaluation: January 23, 2024  Reason for Referral:   Kyle Sanchez is a 78 y.o. right-handed Hispanic male referred by Camie Sevin, PA-C, to characterize his current cognitive functioning and assist with diagnostic clarity and treatment planning in the context of subjective cognitive decline.   Assessment and Plan:   Clinical Impression(s): Kyle Sanchez pattern of performance is suggestive of severe impairment surrounding all aspects of verbal learning and memory. An additional weakness was exhibited across semantic fluency, while performance variability was exhibited across basic attention. Outside of an isolated difficulty across a line orientation task, visuospatial abilities were intact. Performances were also appropriate relative to age-matched peers across processing speed, complex attention, executive functioning, receptive language, phonemic fluency, confrontation naming, and visual memory. Kyle Sanchez denied difficulties completing instrumental activities of daily living (ADLs) independently. 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  The cause for memory dysfunction remains uncertain at the present time. It is important to highlight that, while being fluent in [place], Kyle Sanchez was tested in his non-native language which can certainly create artifical suppressions in verbally mediated tasks. This would include verbal learning and memory and semantic fluency.   Outside of this, the very early beginnings of a neurodegenerative illness such as Alzheimer's disease cannot be ruled out. Kyle Sanchez was fully amnestic (i.e., 0% retention) after brief delays across both list and story-based tasks. He also performed poorly across recognition trials, suggesting some evidence  for rapid forgetting and an evolving storage impairment. 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 A weakness in semantic fluency would also reflect typical illness progression. Importantly, it remains encouraging that visual retention/recognition and confrontation naming remained intact. While the presence of this illness cannot be ruled out, it should not be ruled in with confidence based upon presently available information. As there is no neuroimaging available (his brain MRI is scheduled for 01/26/24), I cannot comment on any potential anatomical abnormalities which could influence memory capabilities. Continued medical monitoring will be important moving forward.   Recommendations: A repeat neuropsychological evaluation in 12-18 months is recommended to assess the trajectory of future cognitive decline should it occur. This will also aid in future efforts towards improved diagnostic clarity.  Blood plasma testing or a lumbar puncture should be considered to better understand the presence or lack of underlying Alzheimer's disease pathology. He should discuss these tests with Ms. Wertman.   If desired, Kyle Sanchez should discuss medication aimed to address memory loss with Ms. Wertman. It is important to highlight that these medications have been shown to slow functional decline in some individuals. There is no current treatment which can stop or reverse cognitive decline when caused by a neurodegenerative illness.   Performance across neurocognitive testing is not a strong predictor of an individual's safety operating a motor vehicle. Should his family wish to pursue a formalized driving evaluation, they could reach out to the following agencies: [facility] [employer] [facility] in [place]: [phone] [facility]: [phone] [facility]: [phone] [facility]: [phone] or [phone]  Should there be progression of current deficits over  time, Kyle Sanchez is unlikely to regain any independent living skills lost. Therefore, it is recommended that he remain as involved as possible in all aspects of household chores, finances, and medication management, with supervision to ensure adequate performance. He will  likely benefit from the establishment and maintenance of a routine in order to maximize his functional abilities over time.  It will be important for Kyle Sanchez to have another person with him when in situations where he may need to process information, weigh the pros and cons of different options, and make decisions, in order to ensure that he fully understands and recalls all information to be considered.  If not already done, Kyle Sanchez and his family may want to discuss his wishes regarding durable power of attorney and medical decision making, so that he can have input into these choices. If they require legal assistance with this, long-term care resource access, or other aspects of estate planning, they could reach out to [employer] at [phone] for a free consultation.   Kyle Sanchez is encouraged to attend to lifestyle factors for brain health (e.g., regular physical exercise, good nutrition habits and consideration of the MIND-DASH diet, regular participation in cognitively-stimulating activities, and general stress management techniques), which are likely to have benefits for both emotional adjustment and cognition. Optimal control of vascular risk factors (including safe cardiovascular exercise and adherence to dietary recommendations) is encouraged. Continued participation in activities which provide mental stimulation and social interaction is also recommended.   Important information should be provided to Kyle Sanchez in written format in all instances. This information should be placed in a highly frequented and easily visible location within his home to promote recall. External strategies such as written notes in a consistently  used memory journal, visual and nonverbal auditory cues such as a calendar on the refrigerator or appointments with alarm, such as on a cell phone, can also help maximize recall.  To address problems with fluctuating attention and/or executive dysfunction, he may wish to consider:   -Avoiding external distractions when needing to concentrate   -Limiting exposure to fast paced environments with multiple sensory demands   -Writing down complicated information and using checklists   -Attempting and completing one task at a time (i.e., no multi-tasking)   -Verbalizing aloud each step of a task to maintain focus   -Taking frequent breaks during the completion of steps/tasks to avoid fatigue   -Reducing the amount of information considered at one time   -Scheduling more difficult activities for a time of day where he is usually most alert  Review of Records:   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   Heart murmur    Hemorrhoids 06/27/2008   Hiatal hernia    1 cm hill grade 2    History of adenomatous colonic polyps 2004   History of migraines    Hyperlipidemia 09/27/2006   Hypertension    IBS (irritable bowel syndrome)    Knee pain 05/08/2023   Neck pain 01/09/2023   Obstructive sleep apnea 09/27/2006   Osteoarthritis    Other fatigue 01/09/2023   Skin lesion 03/16/2023   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    Past Surgical History:  Procedure Laterality Date   CATARACT EXTRACTION, BILATERAL  2021   CHOLECYSTECTOMY     colonoscopy and endoscopy     COLONOSCOPY WITH PROPOFOL  N/A 07/26/2018   Procedure: COLONOSCOPY WITH PROPOFOL ;  Surgeon: Toledo, Ladell POUR, MD;  Location: [facility] ENDOSCOPY;  Service: Gastroenterology;  Laterality: N/A;   ESOPHAGOGASTRODUODENOSCOPY (  EGD) WITH PROPOFOL  N/A 07/26/2018   Procedure:  ESOPHAGOGASTRODUODENOSCOPY (EGD) WITH PROPOFOL ;  Surgeon: Toledo, Ladell POUR, MD;  Location: [facility] ENDOSCOPY;  Service: Gastroenterology;  Laterality: N/A;    Current Outpatient Medications:    aspirin EC 81 MG tablet, Take 81 mg by mouth daily., Disp: , Rfl:    calcium citrate (CALCITRATE - DOSED IN MG ELEMENTAL CALCIUM) 950 (200 Ca) MG tablet, Take 200 mg of elemental calcium by mouth daily. With zinc and magnesium., Disp: , Rfl:    Cholecalciferol  25 MCG (1000 UT) capsule, Take 4 capsules (4,000 Units total) by mouth daily., Disp: , Rfl:    clobetasol  cream (TEMOVATE ) 0.05 %, Apply 1 application topically 2 (two) times daily as needed., Disp: 30 g, Rfl: 1   Ginkgo Biloba 120 MG TABS, Take by mouth daily., Disp: , Rfl:    glucosamine-chondroitin 500-400 MG tablet, Take 1 tablet by mouth daily., Disp: , Rfl:    lactobacillus acidophilus (BACID) TABS tablet, Take 1 tablet by mouth daily., Disp: , Rfl:    Multiple Vitamin (MULTIVITAMIN) capsule, Take 1 capsule by mouth daily., Disp: , Rfl:    Omega-3 Fatty Acids (RA FISH OIL) 1400 MG CPDR, Take by mouth daily., Disp: , Rfl:    polyethylene glycol (MIRALAX / GLYCOLAX) 17 g packet, Take 17 g by mouth daily., Disp: , Rfl:    Red Yeast Rice Extract (RED YEAST RICE PO), Take 1 tablet by mouth daily., Disp: , Rfl:    traMADol  (ULTRAM ) 50 MG tablet, Take 1 tablet (50 mg total) by mouth every 12 (twelve) hours as needed (for hand pain)., Disp: 60 tablet, Rfl: 1   Turmeric 500 MG CAPS, Take 500 mg by mouth in the morning and at bedtime., Disp: , Rfl:        01/19/2024    2:00 PM  Montreal Cognitive Assessment   Visuospatial/ Executive (0/5) 1  Naming (0/3) 3  Attention: Read list of digits (0/2) 2  Attention: Read list of letters (0/1) 1  Attention: Serial 7 subtraction starting at 100 (0/3) 1  Language: Repeat phrase (0/2) 0  Language : Fluency (0/1) 1  Abstraction (0/2) 0  Delayed Recall (0/5) 1  Orientation (0/6) 5  Total 15  Adjusted Score  (based on education) 16   Neuroimaging: Head CT on 10/28/2023 was negative. No other neuroimaging was available for review. A brain MRI was scheduled to be completed on 01/26/2024.   Clinical Interview:   The following information was obtained during a clinical interview with Kyle Sanchez prior to cognitive testing.  Cognitive Symptoms: Decreased short-term memory: Endorsed. He described primary concerns surrounding rapid forgetting, noting that if he doesn't do something right away, he will lose track of what was said to him or what he intended to do. He also described entering rooms and forgetting his original intention. Difficulties were said to be present for the past year or so but have seemed exacerbated following the passing of his brother about six months prior to the current evaluation.  Decreased long-term memory: Denied. Decreased attention/concentration: Endorsed. He described trouble with sustained attention and increased distractibility.  Reduced processing speed: Denied. Difficulties with executive functions: Denied. Difficulties with emotion regulation: Denied. He also denied any prominent personality changes.  Difficulties with receptive language: Denied. Difficulties with word finding: Endorsed maybe a little bit. Decreased visuoperceptual ability: Denied.  Difficulties completing ADLs: Denied. He does not take scheduled medications and takes his vitamins independently. His wife manages finances and bill paying which is  longstanding in nature. He denied any driving-related concerns or instances of getting lost.   Additional Medical History: History of traumatic brain injury/concussion: Denied. History of stroke: Denied. History of seizure activity: Denied. History of known exposure to toxins: Denied. Symptoms of chronic pain: Endorsed. He described some neck pain that he attributed to recent stress. Medical records also suggest arthritic-based pain, especially in his hands.   Experience of frequent headaches/migraines: Denied. Frequent instances of dizziness/vertigo: Denied.  Sensory changes: He wears glasses with benefit. Other sensory changes/difficulties (e.g., hearing, taste, or smell) were not reported.  Balance/coordination difficulties: Denied. He also denied any recent falls.  Other motor difficulties: Denied.  Sleep History: Estimated hours obtained each night: 8 hours.  Difficulties falling asleep: Denied. Difficulties staying asleep: Denied. Feels rested and refreshed upon awakening: Endorsed.  History of snoring: Denied. History of waking up gasping for air: Denied. Witnessed breath cessation while asleep: Denied. Medical records do suggest a history of obstructive sleep apnea.   History of vivid dreaming: Denied. Excessive movement while asleep: Denied. Instances of acting out his dreams: Denied.  Psychiatric/Behavioral Health History: Depression: He described his current mood as down, attributed to the passing of his older brother about six months prior. Outside of this, he denied any prior mental health concerns or formal diagnoses. Current or remote suicidal ideation, intent, or plan was denied.  Anxiety: Denied. Mania: Denied. Trauma History: Denied. Visual/auditory hallucinations: Denied. Delusional thoughts: Denied.  Tobacco: Denied. Alcohol: He denied current alcohol consumption as well as a history of problematic alcohol abuse or dependence.  Recreational drugs: Denied.  Family History: Problem Relation Age of Onset   Nephrolithiasis Mother    Arthritis Mother    Stroke Mother    Alcohol abuse Father    Mental illness Sister    Dementia Brother    Prostate cancer Brother    Colon polyps Brother    Colon cancer Neg Hx    Esophageal cancer Neg Hx    This information was confirmed by Mr. Card.  Academic/Vocational History: Highest level of educational attainment: 10 years. Mr. Voong grew up [place] ultimately  completing the 10th grade. He described himself as an average student, ultimately leaving school to enter the work force and help provide for his family. History was said to be a relative weakness. Spanish is his primary language. He moved to the [place]  in the mid 1960s and is fluent in [place].  History of developmental delay: Denied. History of grade repetition: Denied. Enrollment in special education courses: Denied. History of LD/ADHD: Denied.  Employment: Retired.   Evaluation Results:   Behavioral Observations: Kyle Sanchez was unaccompanied, arrived to his appointment on time, and was appropriately dressed and groomed. He appeared alert. Observed gait and station were within normal limits. Gross motor functioning appeared intact upon informal observation and no abnormal movements (e.g., tremors) were noted. His affect was generally relaxed and positive. Spontaneous speech was fluent and word finding difficulties were not observed during the clinical interview. Thought processes were coherent, organized, and normal in content. Insight into his cognitive difficulties appeared adequate.   During testing, sustained attention was appropriate. Task engagement was adequate and he persisted when challenged. Overall, Kyle Sanchez was cooperative with the clinical interview and subsequent testing procedures.   Adequacy of Effort: The validity of neuropsychological testing is limited by the extent to which the individual being tested may be assumed to have exerted adequate effort during testing. Kyle Sanchez expressed his intention to perform to  the best of his abilities and exhibited adequate task engagement and persistence. Scores across stand-alone and embedded performance validity measures were variable. However, his sole below expectation performance is believed to be due to true cognitive dysfunction rather than poor engagement or attempts to perform poorly. As such, the results of the current  evaluation are believed to be a valid representation of Kyle Sanchez current cognitive functioning.  Test Results: Mr. Tamburrino was oriented at the time of the current evaluation. He was one day off when stating the current date. He was also unable to provide his phone number, noting that he recently changed to a new number and had not memorized this one yet.   Intellectual abilities based upon educational and vocational attainment were estimated to be in the below average to average range. Premorbid abilities were estimated to be within the below average range based upon a single-word reading test.   Processing speed was mildly variable but overall appropriate, ranging from the below average to above average normative ranges. Basic attention was exceptionally low to below average. More complex attention (e.g., working memory) was average. Executive functioning was below average to average.  While not directly assessed, receptive language abilities were believed to be intact. Mr. Ende did not exhibit any difficulties comprehending task instructions and answered all questions asked of him appropriately. Assessed expressive language was mildly variable. Phonemic fluency was average, semantic fluency was well below average to below average, and confrontation naming was average.     Assessed visuospatial/visuoconstructional abilities were average to well above average outside of an isolated well below average performance across a line orientation task.    Learning (i.e., encoding) of novel verbal information was exceptionally low. Spontaneous delayed recall (i.e., retrieval) of previously learned information was average across a figure-based task but exceptionally low across both verbal tasks. Retention rates were 0% across a list learning task, 0% across a story learning task, and 55% across a figure drawing task. Performance across recognition tasks was below average across a figure-based task but exceptionally  low to well below average across both verbal tasks, suggesting limited evidence for information consolidation.   Results of emotional screening instruments suggested that recent symptoms of generalized anxiety were in the mild range, while symptoms of depression were within normal limits. A screening instrument assessing recent sleep quality suggested the presence of minimal sleep dysfunction.  Table of Scores:   Note: This summary of test scores accompanies the interpretive report and should not be considered in isolation without reference to the appropriate sections in the text. Descriptors are based on appropriate normative data and may be adjusted based on clinical judgment. Terms such as Within Normal Limits and Outside Normal Limits are used when a more specific description of the test score cannot be determined.       Percentile - Normative Descriptor > 98 - Exceptionally High 91-97 - Well Above Average 75-90 - Above Average 25-74 - Average 9-24 - Below Average 2-8 - Well Below Average < 2 - Exceptionally Low       Validity:   DESCRIPTOR       DCT: --- --- Within Normal Limits  RBANS EI: --- --- Outside Normal Limits       Orientation:      Raw Score Percentile   NAB Orientation, Form 1 25/29 --- ---       Cognitive Screening:     RBANS, Form A: Standard Score/ Scaled Score Percentile   Total Score 67 1 Exceptionally Low  Immediate Memory 57 <1 Exceptionally Low    List Learning 3 1 Exceptionally Low    Story Memory 3 1 Exceptionally Low  Visuospatial/Constructional 100 50 Average    Figure Copy 14 91 Well Above Average    Line Orientation 12/20 3-9 Well Below Average  Language 88 21 Below Average    Picture Naming 10/10 51-75 Average    Semantic Fluency 5 5 Well Below Average  Attention 68 2 Well Below Average    Digit Span 3 1 Exceptionally Low    Coding 7 16 Below Average  Delayed Memory 56 <1 Exceptionally Low    List Recall 0/10 <2 Exceptionally Low    List  Recognition 14/20 <2 Exceptionally Low    Story Recall 1 <1 Exceptionally Low    Story Recognition 7/12 5-7 Well Below Average    Figure Recall 9 37 Average    Figure Recognition 4/8 9-20 Below Average        Intellectual Functioning:      Standard Score Percentile   Test of Premorbid Functioning: 84 14 Below Average       Attention/Executive Function:     Trail Making Test (TMT): Raw Score (T Score) Percentile     Part A 38 secs.,  1 error (55) 69 Average    Part B 206 secs.,  1 error (43) 25 Average         Scaled Score Percentile   WAIS-5 Coding: 7 16 Below Average  WAIS-5 Naming Speed Quantity: 12 75 Above Average        Scaled Score Percentile   WAIS-5 Digits Forwards: 6 9 Below Average  WAIS-5 Digit Sequencing: 9 37 Average        Scaled Score Percentile   WAIS-5 Matrix Reasoning: 8 25 Average       D-KEFS Verbal Fluency Test: Raw Score (Scaled Score) Percentile     Letter Total Correct 29 (8) 25 Average    Category Total Correct 25 (7) 16 Below Average    Category Switching Total Correct 9 (6) 9 Below Average    Category Switching Accuracy 8 (7) 16 Below Average      Total Set Loss Errors 0 (13) 84 Above Average      Total Repetition Errors 2 (11) 63 Average       Language:     Verbal Fluency Test: Raw Score (T Score) Percentile     Phonemic Fluency (FAS) 29 (47) 38 Average    Animal Fluency 12 (41) 18 Below Average        NAB Language Module, Form 1: T Score Percentile     Naming 28/31 (44) 27 Average       Visuospatial/Visuoconstruction:      Raw Score Percentile   Clock Drawing: 8/10 --- Within Normal Limits        Scaled Score Percentile   WAIS-5 Block Design: 10 50 Average  WAIS-5 Visual Puzzles: 9 37 Average       Mood and Personality:      Raw Score Percentile   Geriatric Depression Scale: 4 --- Within Normal Limits  PROMIS Anxiety Questionnaire: 17 --- Mild       Additional Questionnaires:      Raw Score Percentile   PROMIS Sleep  Disturbance Questionnaire: 12 --- None to Slight   Informed Consent and Coding/Compliance:   The current evaluation represents a clinical evaluation for the purposes previously outlined by the referral source and is in no way reflective of a forensic  evaluation.   Mr. Goswami was provided with a verbal description of the nature and purpose of the present neuropsychological evaluation. Also reviewed were the foreseeable risks and/or discomforts and benefits of the procedure, limits of confidentiality, and mandatory reporting requirements of this provider. The patient was given the opportunity to ask questions and receive answers about the evaluation. Oral consent to participate was provided by the patient.   This evaluation was conducted by Arthea KYM Maryland, Ph.D., ABPP-CN, board certified clinical neuropsychologist. Mr. Kessen completed a clinical interview with Dr. Maryland, billed as one unit [phone], and 120 minutes of cognitive testing and scoring, billed as one unit [phone] and three additional units 96137. As a separate and discrete service, one unit [phone] and two units 96133 (160 minutes) were billed for Dr. Loralee time spent in interpretation and report writing.

## 2024-01-24 ENCOUNTER — Encounter: Payer: Self-pay | Admitting: Family Medicine

## 2024-01-24 ENCOUNTER — Ambulatory Visit (INDEPENDENT_AMBULATORY_CARE_PROVIDER_SITE_OTHER): Admitting: Family Medicine

## 2024-01-24 VITALS — BP 138/60 | HR 61 | Temp 98.3°F | Ht 67.0 in | Wt 164.4 lb

## 2024-01-24 DIAGNOSIS — M7062 Trochanteric bursitis, left hip: Secondary | ICD-10-CM | POA: Insufficient documentation

## 2024-01-24 DIAGNOSIS — M25551 Pain in right hip: Secondary | ICD-10-CM | POA: Insufficient documentation

## 2024-01-24 MED ORDER — PREDNISONE 20 MG PO TABS: ORAL_TABLET | ORAL | 0 refills | Status: DC

## 2024-01-24 NOTE — Progress Notes (Signed)
 Patient ID: Kyle Sanchez, male    DOB: August 06, 1945, 78 y.o.   MRN: [id number]  This visit was conducted in person.  BP 138/60   Pulse 61   Temp 98.3 F (36.8 C) (Temporal)   Ht 5' 7 (1.702 m)   Wt 164 lb 6 oz (74.6 kg)   SpO2 98%   BMI 25.74 kg/m    CC:  Chief Complaint  Patient presents with   Hip Pain    X 3-4 days    Subjective:   HPI: Kyle Sanchez is a 78 y.o. male presenting on 01/24/2024 for Hip Pain (X 3-4 days)  PCP Cleatus New onset hip pain x 3-4 days.SABRA lateral hip [pain into groin.  NO recent falls,  he has started walking again in last weeks.. 45 min at a time.  Feels pain with crossing legs and with sitting. Pain with walking.  Pain to lie on that side  No low back pain.   No numbness or weakness in leg. No radiation of pain to foot.   Has not treated with med.  6-7 month ago had similar pain 1 week ago, went away on its own.   Used tylenol  for pain.     Relevant past medical, surgical, family and social history reviewed and updated as indicated. Interim medical history since our last visit reviewed. Allergies and medications reviewed and updated. Outpatient Medications Prior to Visit  Medication Sig Dispense Refill   aspirin EC 81 MG tablet Take 81 mg by mouth daily.     calcium citrate (CALCITRATE - DOSED IN MG ELEMENTAL CALCIUM) 950 (200 Ca) MG tablet Take 200 mg of elemental calcium by mouth daily. With zinc and magnesium.     Cholecalciferol  25 MCG (1000 UT) capsule Take 4 capsules (4,000 Units total) by mouth daily.     clobetasol  cream (TEMOVATE ) 0.05 % Apply 1 application topically 2 (two) times daily as needed. 30 g 1   Ginkgo Biloba 120 MG TABS Take by mouth daily.     glucosamine-chondroitin 500-400 MG tablet Take 1 tablet by mouth daily.     lactobacillus acidophilus (BACID) TABS tablet Take 1 tablet by mouth daily.     Multiple Vitamin (MULTIVITAMIN) capsule Take 1 capsule by mouth daily.     Omega-3 Fatty Acids (RA FISH OIL) 1400 MG  CPDR Take by mouth daily.     polyethylene glycol (MIRALAX / GLYCOLAX) 17 g packet Take 17 g by mouth daily.     Red Yeast Rice Extract (RED YEAST RICE PO) Take 1 tablet by mouth daily.     traMADol  (ULTRAM ) 50 MG tablet Take 1 tablet (50 mg total) by mouth every 12 (twelve) hours as needed (for hand pain). 60 tablet 1   Turmeric 500 MG CAPS Take 500 mg by mouth in the morning and at bedtime.     No facility-administered medications prior to visit.     Per HPI unless specifically indicated in ROS section below Review of Systems  Constitutional:  Negative for fatigue and fever.  HENT:  Negative for ear pain.   Eyes:  Negative for pain.  Respiratory:  Negative for cough and shortness of breath.   Cardiovascular:  Negative for chest pain, palpitations and leg swelling.  Gastrointestinal:  Negative for abdominal pain.  Genitourinary:  Negative for dysuria.  Musculoskeletal:  Positive for arthralgias and gait problem.  Neurological:  Negative for syncope, light-headedness and headaches.  Psychiatric/Behavioral:  Negative for dysphoric mood.    Objective:  BP 138/60   Pulse 61   Temp 98.3 F (36.8 C) (Temporal)   Ht 5' 7 (1.702 m)   Wt 164 lb 6 oz (74.6 kg)   SpO2 98%   BMI 25.74 kg/m   Wt Readings from Last 3 Encounters:  01/24/24 164 lb 6 oz (74.6 kg)  01/19/24 165 lb (74.8 kg)  10/20/23 161 lb 12.8 oz (73.4 kg)      Physical Exam Vitals reviewed.  Constitutional:      Appearance: He is well-developed.  HENT:     Head: Normocephalic.     Right Ear: Hearing normal.     Left Ear: Hearing normal.     Nose: Nose normal.  Neck:     Thyroid : No thyroid  mass or thyromegaly.     Vascular: No carotid bruit.     Trachea: Trachea normal.  Cardiovascular:     Rate and Rhythm: Normal rate and regular rhythm.     Pulses: Normal pulses.     Heart sounds: Heart sounds not distant. No murmur heard.    No friction rub. No gallop.     Comments: No peripheral edema Pulmonary:      Effort: Pulmonary effort is normal. No respiratory distress.     Breath sounds: Normal breath sounds.  Musculoskeletal:     Right hip: Normal.     Left hip: Tenderness and bony tenderness present. Decreased range of motion.     Left upper leg: Normal. No swelling, edema, tenderness or bony tenderness.     Comments: Ttp laterally over bursa, no anterior  hip pain  Skin:    General: Skin is warm and dry.     Findings: No rash.  Psychiatric:        Speech: Speech normal.        Behavior: Behavior normal.        Thought Content: Thought content normal.       Results for orders placed or performed in visit on 10/20/23  CBC with Differential/Platelet   Collection Time: 10/20/23 11:49 AM  Result Value Ref Range   WBC 6.4 4.0 - 10.5 K/uL   RBC 4.98 4.22 - 5.81 Mil/uL   Hemoglobin 14.9 13.0 - 17.0 g/dL   HCT 54.9 60.9 - 47.9 %   MCV 90.4 78.0 - 100.0 fl   MCHC 33.0 30.0 - 36.0 g/dL   RDW 84.9 88.4 - 84.4 %   Platelets 237.0 150.0 - 400.0 K/uL   Neutrophils Relative % 51.9 43.0 - 77.0 %   Lymphocytes Relative 27.0 12.0 - 46.0 %   Monocytes Relative 9.4 3.0 - 12.0 %   Eosinophils Relative 10.0 (H) 0.0 - 5.0 %   Basophils Relative 1.7 0.0 - 3.0 %   Neutro Abs 3.3 1.4 - 7.7 K/uL   Lymphs Abs 1.7 0.7 - 4.0 K/uL   Monocytes Absolute 0.6 0.1 - 1.0 K/uL   Eosinophils Absolute 0.6 0.0 - 0.7 K/uL   Basophils Absolute 0.1 0.0 - 0.1 K/uL  Comprehensive metabolic panel with GFR   Collection Time: 10/20/23 11:49 AM  Result Value Ref Range   Sodium 140 135 - 145 mEq/L   Potassium 4.4 3.5 - 5.1 mEq/L   Chloride 103 96 - 112 mEq/L   CO2 31 19 - 32 mEq/L   Glucose, Bld 98 70 - 99 mg/dL   BUN 14 6 - 23 mg/dL   Creatinine, Ser 9.13 0.40 - 1.50 mg/dL   Total Bilirubin 0.5 0.2 - 1.2 mg/dL  Alkaline Phosphatase 71 39 - 117 U/L   AST 20 0 - 37 U/L   ALT 18 0 - 53 U/L   Total Protein 7.5 6.0 - 8.3 g/dL   Albumin 4.7 3.5 - 5.2 g/dL   GFR 17.37 >39.99 mL/min   Calcium 10.0 8.4 - 10.5 mg/dL  TSH    Collection Time: 10/20/23 11:49 AM  Result Value Ref Range   TSH 1.88 0.35 - 5.50 uIU/mL  Vitamin B12   Collection Time: 10/20/23 11:49 AM  Result Value Ref Range   Vitamin B-12 1,289 (H) 211 - 911 pg/mL    Assessment and Plan  Greater trochanteric bursitis, left Assessment & Plan: Acute, most likely secondary to starting walking regimen. Will treat with prednisone  taper, ice and start home physical therapy exercises.  Return and ER precautions provided.   Other orders -     predniSONE ; 3 tabs by mouth daily x 3 days, then 2 tabs by mouth daily x 2 days then 1 tab by mouth daily x 2 days  Dispense: 15 tablet; Refill: 0    No follow-ups on file.   Greig Ring, MD

## 2024-01-24 NOTE — Assessment & Plan Note (Signed)
 Acute, most likely secondary to starting walking regimen. Will treat with prednisone  taper, ice and start home physical therapy exercises.  Return and ER precautions provided.

## 2024-01-26 ENCOUNTER — Telehealth: Payer: Self-pay | Admitting: Family Medicine

## 2024-01-26 ENCOUNTER — Other Ambulatory Visit: Payer: Self-pay

## 2024-01-26 ENCOUNTER — Telehealth: Payer: Self-pay | Admitting: Physician Assistant

## 2024-01-26 ENCOUNTER — Ambulatory Visit: Admission: RE | Admit: 2024-01-26 | Source: Ambulatory Visit

## 2024-01-26 DIAGNOSIS — R413 Other amnesia: Secondary | ICD-10-CM

## 2024-01-26 NOTE — Telephone Encounter (Signed)
 Pt. Was afraid of MRI, needs new Open MRI with sedation orders and a call please thanks

## 2024-01-26 NOTE — Telephone Encounter (Signed)
 Pt states, he went to his MRI appt and had a panic attack when he was put in the MRI machine. He would like an anti anxiety medication. Once he gets the medication, he will call the MRI office to reschedule the appt. Please advise.

## 2024-01-26 NOTE — Telephone Encounter (Signed)
 Sent to [facility]

## 2024-01-27 MED ORDER — DIAZEPAM 2 MG PO TABS: 2.0000 mg | ORAL_TABLET | Freq: Once | ORAL | 0 refills | Status: DC

## 2024-01-27 NOTE — Telephone Encounter (Signed)
 Could use valium, rx sent, would need a driver if used.  Thanks.

## 2024-01-30 NOTE — Telephone Encounter (Signed)
 Left message for patient per DPR to advise that Dr. Cleatus sent in a prescription to take for MRI but he would need a driver

## 2024-01-31 ENCOUNTER — Telehealth: Payer: Self-pay

## 2024-01-31 ENCOUNTER — Ambulatory Visit: Admission: RE | Admit: 2024-01-31 | Source: Ambulatory Visit

## 2024-01-31 NOTE — Telephone Encounter (Signed)
 Spoke with patients spouse Kyle Sanchez that is on the [place]. I did advise that typically the valium  is taken 30-60 minutes prior to MRI. Typically the side effect is drowsiness that is why we advise that the patient has a driver. If he is nervous that he can take the first one an hour before and then once he arrives he can let them know that he has taken one and that if needs the other one that he has it available. Wife understood.   Copied from CRM [id number]. Topic: Clinical - Medication Question >> Jan 31, 2024 11:30 AM Kyle Sanchez wrote: Reason for CRM: Patient has an MRI at 5pm today and was prescribed diazepam  (VALIUM ) 2 MG tablet. Wife is wanting to know when he should take it and what effects he should feel to ensure there are no issues with medicine.  Please call [phone]

## 2024-02-07 ENCOUNTER — Ambulatory Visit: Admitting: Psychology

## 2024-02-07 DIAGNOSIS — G3184 Mild cognitive impairment, so stated: Secondary | ICD-10-CM | POA: Diagnosis not present

## 2024-02-07 NOTE — Progress Notes (Signed)
   Neuropsychology Feedback Session Jolynn DEL. [facility] [place] Department of Neurology  Reason for Referral:   Kyle Sanchez is a 78 y.o. right-handed Hispanic male referred by Camie Sevin, PA-C, to characterize his current cognitive functioning and assist with diagnostic clarity and treatment planning in the context of subjective cognitive decline.   Feedback:   Kyle Sanchez completed a comprehensive neuropsychological evaluation on 01/23/2024. Please refer to that encounter for the full report and recommendations. 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 An additional weakness was exhibited across semantic fluency, while performance variability was exhibited across basic attention. Outside of an isolated difficulty across a line orientation task, visuospatial abilities were intact. The cause for memory dysfunction remains uncertain at the present time. It is important to highlight that, while being fluent in [place], Kyle Sanchez was tested in his non-native language which can certainly create artifical suppressions in verbally mediated tasks. This would include verbal learning and memory and semantic fluency. Outside of this, the very early beginnings of a neurodegenerative illness such as Alzheimer's disease cannot be ruled out. Kyle Sanchez was fully amnestic (i.e., 0% retention) after brief delays across both list and story-based tasks. He also performed poorly across recognition trials, suggesting some evidence for rapid forgetting and an evolving storage impairment. 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 A weakness in semantic fluency would also reflect typical illness progression. Importantly, it remains encouraging that visual retention/recognition and confrontation naming remained intact. While the presence of this illness cannot be ruled out, it should not be ruled in with confidence based upon presently  available information. Continued medical monitoring will be important moving forward.   Kyle Sanchez was unaccompanied during the current feedback session. Content of the current session focused on the results of his neuropsychological evaluation. Kyle Sanchez was given the opportunity to ask questions and his questions were answered. He was encouraged to reach out should additional questions arise. A copy of his report was provided at the conclusion of the visit.      One unit 96132 (31 minutes) was billed for Dr. Loralee time spent preparing for, conducting, and documenting the current feedback session with Kyle Sanchez.

## 2024-02-22 ENCOUNTER — Encounter: Payer: Self-pay | Admitting: Physician Assistant

## 2024-02-22 ENCOUNTER — Ambulatory Visit: Payer: Self-pay | Admitting: Family Medicine

## 2024-02-22 ENCOUNTER — Ambulatory Visit (INDEPENDENT_AMBULATORY_CARE_PROVIDER_SITE_OTHER): Admitting: Family Medicine

## 2024-02-22 ENCOUNTER — Encounter

## 2024-02-22 ENCOUNTER — Encounter: Payer: Self-pay | Admitting: Family Medicine

## 2024-02-22 ENCOUNTER — Ambulatory Visit (INDEPENDENT_AMBULATORY_CARE_PROVIDER_SITE_OTHER)
Admission: RE | Admit: 2024-02-22 | Discharge: 2024-02-22 | Disposition: A | Discharge status: Home / Self Care | Source: Ambulatory Visit | Attending: Family Medicine | Admitting: Family Medicine

## 2024-02-22 VITALS — BP 124/62 | HR 72 | Temp 98.7°F | Ht 67.0 in | Wt 163.4 lb

## 2024-02-22 DIAGNOSIS — M25551 Pain in right hip: Secondary | ICD-10-CM | POA: Diagnosis not present

## 2024-02-22 DIAGNOSIS — M1611 Unilateral primary osteoarthritis, right hip: Secondary | ICD-10-CM | POA: Diagnosis not present

## 2024-02-22 MED ORDER — MELOXICAM 7.5 MG PO TABS: 7.5000 mg | ORAL_TABLET | Freq: Every day | ORAL | 0 refills | Status: DC

## 2024-02-22 NOTE — Progress Notes (Signed)
 Patient ID: Kyle Sanchez, male    DOB: 01/14/1946, 78 y.o.   MRN: [id number]  This visit was conducted in person.  BP 124/62   Pulse 72   Temp 98.7 F (37.1 C) (Oral)   Ht 5' 7 (1.702 m)   Wt 163 lb 6.4 oz (74.1 kg)   SpO2 100%   BMI 25.59 kg/m    CC:  Chief Complaint  Patient presents with   Hip Pain    Bilateral. Right hurts the most. Trouble walking. Ongoing 2- days       Subjective:   HPI: Kyle Sanchez is a 78 y.o. male presenting on 02/22/2024 for Hip Pain (Bilateral. Right hurts the most. Trouble walking. Ongoing 2- days   )    New onset pain  in bilateral hips, R>L ongoing x 2 days.  Started mainly with the right now since pampering the right, left is starting to hurt.  Anterior  Pain constant, pain with flexing hip., trouble walking.  No recent fall or injury.  He does 2 times a  week.. no change.  Taking tylenol ... does not help. Using cold and hot  pad... helps slightly.   History of left trochanteric bursitis... this feels similar.... He has had injetion in hip in past.  Has this flare every 6 months.  Relevant past medical, surgical, family and social history reviewed and updated as indicated. Interim medical history since our last visit reviewed. Allergies and medications reviewed and updated. Outpatient Medications Prior to Visit  Medication Sig Dispense Refill   aspirin EC 81 MG tablet Take 81 mg by mouth daily.     calcium citrate (CALCITRATE - DOSED IN MG ELEMENTAL CALCIUM) 950 (200 Ca) MG tablet Take 200 mg of elemental calcium by mouth daily. With zinc and magnesium.     Cholecalciferol  25 MCG (1000 UT) capsule Take 4 capsules (4,000 Units total) by mouth daily.     clobetasol  cream (TEMOVATE ) 0.05 % Apply 1 application topically 2 (two) times daily as needed. 30 g 1   Ginkgo Biloba 120 MG TABS Take by mouth daily.     glucosamine-chondroitin 500-400 MG tablet Take 1 tablet by mouth daily.     lactobacillus acidophilus (BACID) TABS tablet Take 1  tablet by mouth daily.     Multiple Vitamin (MULTIVITAMIN) capsule Take 1 capsule by mouth daily.     Omega-3 Fatty Acids (RA FISH OIL) 1400 MG CPDR Take by mouth daily.     polyethylene glycol (MIRALAX / GLYCOLAX) 17 g packet Take 17 g by mouth daily.     predniSONE  (DELTASONE ) 20 MG tablet 3 tabs by mouth daily x 3 days, then 2 tabs by mouth daily x 2 days then 1 tab by mouth daily x 2 days 15 tablet 0   Red Yeast Rice Extract (RED YEAST RICE PO) Take 1 tablet by mouth daily.     traMADol  (ULTRAM ) 50 MG tablet Take 1 tablet (50 mg total) by mouth every 12 (twelve) hours as needed (for hand pain). 60 tablet 1   Turmeric 500 MG CAPS Take 500 mg by mouth in the morning and at bedtime.     No facility-administered medications prior to visit.     Per HPI unless specifically indicated in ROS section below Review of Systems  Constitutional:  Negative for fatigue and fever.  HENT:  Negative for ear pain.   Eyes:  Negative for pain.  Respiratory:  Negative for cough and shortness of breath.   Cardiovascular:  Negative for chest pain, palpitations and leg swelling.  Gastrointestinal:  Negative for abdominal pain.  Genitourinary:  Negative for dysuria.  Musculoskeletal:  Positive for gait problem. Negative for arthralgias and back pain.  Neurological:  Negative for syncope, light-headedness and headaches.  Psychiatric/Behavioral:  Negative for dysphoric mood.    Objective:  BP 124/62   Pulse 72   Temp 98.7 F (37.1 C) (Oral)   Ht 5' 7 (1.702 m)   Wt 163 lb 6.4 oz (74.1 kg)   SpO2 100%   BMI 25.59 kg/m   Wt Readings from Last 3 Encounters:  02/22/24 163 lb 6.4 oz (74.1 kg)  01/24/24 164 lb 6 oz (74.6 kg)  01/19/24 165 lb (74.8 kg)      Physical Exam Constitutional:      Appearance: He is well-developed.  HENT:     Head: Normocephalic.     Right Ear: Hearing normal.     Left Ear: Hearing normal.     Nose: Nose normal.  Neck:     Thyroid : No thyroid  mass or thyromegaly.      Vascular: No carotid bruit.     Trachea: Trachea normal.  Cardiovascular:     Rate and Rhythm: Normal rate and regular rhythm.     Pulses: Normal pulses.     Heart sounds: Heart sounds not distant. No murmur heard.    No friction rub. No gallop.     Comments: No peripheral edema Pulmonary:     Effort: Pulmonary effort is normal. No respiratory distress.     Breath sounds: Normal breath sounds.  Musculoskeletal:     Thoracic back: Normal.     Lumbar back: Normal. Normal range of motion. Negative right straight leg raise test and negative left straight leg raise test.     Right hip: Tenderness and bony tenderness present. Decreased range of motion. Normal strength.     Left hip: Tenderness and bony tenderness present. Decreased range of motion. Normal strength.       Legs:     Comments: Area of ttp.  No lateral pain  Faber positive.  Skin:    General: Skin is warm and dry.     Findings: No rash.  Psychiatric:        Speech: Speech normal.        Behavior: Behavior normal.        Thought Content: Thought content normal.       Results for orders placed or performed in visit on 10/20/23  CBC with Differential/Platelet   Collection Time: 10/20/23 11:49 AM  Result Value Ref Range   WBC 6.4 4.0 - 10.5 K/uL   RBC 4.98 4.22 - 5.81 Mil/uL   Hemoglobin 14.9 13.0 - 17.0 g/dL   HCT 54.9 60.9 - 47.9 %   MCV 90.4 78.0 - 100.0 fl   MCHC 33.0 30.0 - 36.0 g/dL   RDW 84.9 88.4 - 84.4 %   Platelets 237.0 150.0 - 400.0 K/uL   Neutrophils Relative % 51.9 43.0 - 77.0 %   Lymphocytes Relative 27.0 12.0 - 46.0 %   Monocytes Relative 9.4 3.0 - 12.0 %   Eosinophils Relative 10.0 (H) 0.0 - 5.0 %   Basophils Relative 1.7 0.0 - 3.0 %   Neutro Abs 3.3 1.4 - 7.7 K/uL   Lymphs Abs 1.7 0.7 - 4.0 K/uL   Monocytes Absolute 0.6 0.1 - 1.0 K/uL   Eosinophils Absolute 0.6 0.0 - 0.7 K/uL   Basophils Absolute 0.1 0.0 - 0.1  K/uL  Comprehensive metabolic panel with GFR   Collection Time: 10/20/23 11:49 AM   Result Value Ref Range   Sodium 140 135 - 145 mEq/L   Potassium 4.4 3.5 - 5.1 mEq/L   Chloride 103 96 - 112 mEq/L   CO2 31 19 - 32 mEq/L   Glucose, Bld 98 70 - 99 mg/dL   BUN 14 6 - 23 mg/dL   Creatinine, Ser 9.13 0.40 - 1.50 mg/dL   Total Bilirubin 0.5 0.2 - 1.2 mg/dL   Alkaline Phosphatase 71 39 - 117 U/L   AST 20 0 - 37 U/L   ALT 18 0 - 53 U/L   Total Protein 7.5 6.0 - 8.3 g/dL   Albumin 4.7 3.5 - 5.2 g/dL   GFR 17.37 >39.99 mL/min   Calcium 10.0 8.4 - 10.5 mg/dL  TSH   Collection Time: 10/20/23 11:49 AM  Result Value Ref Range   TSH 1.88 0.35 - 5.50 uIU/mL  Vitamin B12   Collection Time: 10/20/23 11:49 AM  Result Value Ref Range   Vitamin B-12 1,289 (H) 211 - 911 pg/mL    Assessment and Plan  There are no diagnoses linked to this encounter.  No follow-ups on file.   Greig Ring, MD

## 2024-02-22 NOTE — Assessment & Plan Note (Signed)
 Chronic, recurrent with acute flare. 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  He does have some tenderness to palpation over insertion of the iliotibial band so tendinitis in this area may be possible. Will evaluate with x-rays given recurrent issue.  Start meloxicam  7.5 to 15 mg p.o. daily as needed pain and inflammation.  If not improving as expected follow-up with orthopedics for possible steroid injection.

## 2024-02-29 ENCOUNTER — Ambulatory Visit: Payer: Self-pay

## 2024-02-29 NOTE — Telephone Encounter (Signed)
 Noted. Thanks.

## 2024-02-29 NOTE — Telephone Encounter (Signed)
 I spoke with pt; pt has no known injury to cause the rt hip pain. Pt said the dull rt hip pain is when walking and the pain level is 5-6. Pt said has had pain for about 4 days. Pt is presently taking meloxicam7.5 mg take 1 in AM and taking 1 - 2 at hs depending on how badly he is hurting. We changed pts appt with dr Cleatus until 03/01/24 at 3 pm with UC & ED precautions and pt voiced understanding and appreciated sooner appt. Pt said he will be ok taking meloxicam  until can see Dr Cleatus on [facility] 03/01/24. Sending note to DR Cleatus.

## 2024-02-29 NOTE — Telephone Encounter (Signed)
 FYI Only or Action Required?: FYI only for provider.  Patient was last seen in primary care on 02/22/2024 by Avelina Greig BRAVO, MD.  Called Nurse Triage reporting Hip Pain.  Symptoms began several days ago.  Interventions attempted: Nothing.  Symptoms are: gradually worsening.  Triage Disposition: See PCP When Office is Open (Within 3 Days)  Patient/caregiver understands and will follow disposition?: Yes         Copied from CRM #[id number]. Topic: Clinical - Red Word Triage >> Feb 29, 2024  2:23 PM Chiquita SQUIBB wrote: Red Word that prompted transfer to Nurse Triage: Patient is calling in with severe pain in his hip and can barley walk. Patient states this has been going on for about four days. Reason for Disposition  [1] MODERATE pain (e.g., interferes with normal activities, limping) AND [2] present > 3 days  Answer Assessment - Initial Assessment Questions 1. LOCATION and RADIATION: Where is the pain located? Does the pain spread (shoot) anywhere else?     Right hip 2. QUALITY: What does the pain feel like?  (e.g., sharp, dull, aching, burning)     sharp 3. SEVERITY: How bad is the pain? What does it keep you from doing?   (Scale 1-10; or mild, moderate, severe)     Comes and goes, mild to severe 4. ONSET: When did the pain start? Does it come and go, or is it there all the time?     2-3 weeks 5. WORK OR EXERCISE: Has there been any recent work or exercise that involved this part of the body?      denies 6. CAUSE: What do you think is causing the hip pain?      unknown 7. AGGRAVATING FACTORS: What makes the hip pain worse? (e.g., walking, climbing stairs, running)     Walking on it 8. OTHER SYMPTOMS: Do you have any other symptoms? (e.g., back pain, pain shooting down leg,  fever, rash)     Denies.  Protocols used: Hip Pain-A-AH

## 2024-02-29 NOTE — Telephone Encounter (Signed)
 What is he doing for pain in the meantime?  Can he manage until the OV?  Can we add him on tomorrow at 3PM?  Please let me know.

## 2024-03-01 ENCOUNTER — Encounter: Payer: Self-pay | Admitting: Family Medicine

## 2024-03-01 ENCOUNTER — Ambulatory Visit (INDEPENDENT_AMBULATORY_CARE_PROVIDER_SITE_OTHER): Admitting: Family Medicine

## 2024-03-01 VITALS — BP 132/62 | HR 62 | Temp 98.4°F | Ht 67.0 in | Wt 164.2 lb

## 2024-03-01 DIAGNOSIS — M76891 Other specified enthesopathies of right lower limb, excluding foot: Secondary | ICD-10-CM | POA: Diagnosis not present

## 2024-03-01 DIAGNOSIS — G3184 Mild cognitive impairment, so stated: Secondary | ICD-10-CM | POA: Diagnosis not present

## 2024-03-01 DIAGNOSIS — M25551 Pain in right hip: Secondary | ICD-10-CM

## 2024-03-01 NOTE — Patient Instructions (Signed)
 Keep taking meloxicam  with food.  You should get a call about seeing PT.   Concern for tendonitis near the hip.   Ice for 5 minutes at a time if needed.    I'll check with neuro.  Take care.  Glad to see you.

## 2024-03-01 NOTE — Progress Notes (Unsigned)
 R hip pain, had OV re: pain recently.  Discussed prev eval.    MRI was to be done today, per neuro.  He got claustrophobic and couldn't get the test done.  Memory changes d/w pt.    Can he get MRI done at another facility. Ie open MRI?  Check with [facility].   He is likely going to need a med prior to repeat attempt.    Meds, vitals, and allergies reviewed.   ROS: Per HPI unless specifically indicated in ROS section   Anterior muscle insertion soreness.

## 2024-03-02 ENCOUNTER — Ambulatory Visit: Admitting: Family Medicine

## 2024-03-04 ENCOUNTER — Telehealth: Payer: Self-pay | Admitting: Family Medicine

## 2024-03-04 NOTE — Telephone Encounter (Signed)
 Recently saw this patient in clinic, the day he was to have his MRI. He got claustrophobic and couldn't get the test done.    D/w pt that I would check with you to see if he can get MRI done at another facility, ie open MRI.    He is likely going to need a med prior to repeat attempt.   I didn't change the order.  Please let me know if I can be of service.  Thanks.

## 2024-03-04 NOTE — Assessment & Plan Note (Signed)
 Prev xray d/w pt.  The pain is likely not from the hip joint itself, d/w pt. Concern for tendonitis near the hip.  Would keep taking meloxicam  with food.  He should get a call about seeing PT.  Ice for 5 minutes at a time if needed.

## 2024-03-04 NOTE — Assessment & Plan Note (Signed)
 D/w pt that I would check with neuro to see if he can get MRI done at another facility, ie open MRI.    He is likely going to need a med prior to repeat attempt.

## 2024-03-05 NOTE — Telephone Encounter (Signed)
 I have reordered MRI open at [facility], they will contact patient.

## 2024-03-06 ENCOUNTER — Telehealth: Payer: Self-pay | Admitting: Physician Assistant

## 2024-03-06 ENCOUNTER — Ambulatory Visit: Attending: Family Medicine | Admitting: Physical Therapy

## 2024-03-06 DIAGNOSIS — M76891 Other specified enthesopathies of right lower limb, excluding foot: Secondary | ICD-10-CM | POA: Insufficient documentation

## 2024-03-06 DIAGNOSIS — M25551 Pain in right hip: Secondary | ICD-10-CM | POA: Insufficient documentation

## 2024-03-06 NOTE — Telephone Encounter (Signed)
 Tiffany with [facility] called and stated that they tried to do the MRI without contrast, but pt could not go through with it. Tiffany stated that a new order need to be place for anesthesia so they can do the MRI. Thanks

## 2024-03-06 NOTE — Telephone Encounter (Signed)
 I have placed open MRI per Dr.Jeremy Hill

## 2024-03-06 NOTE — Therapy (Unsigned)
 OUTPATIENT PHYSICAL THERAPY LOWER EXTREMITY EVALUATION   Patient Name: Kyle Sanchez MRN: [id number] DOB:05-08-1946, 78 y.o., male Today's Date: 03/06/2024  END OF SESSION:  PT End of Session - 03/06/24 1746     Visit Number 1    Number of Visits 24    Date for PT Re-Evaluation 05/29/24    Authorization Type [employer]    Authorization - Visit Number 1    Authorization - Number of Visits 24    Progress Note Due on Visit 10    PT Start Time 1645    PT Stop Time 1730    PT Time Calculation (min) 45 min    Activity Tolerance Patient limited by pain    Behavior During Therapy [facility] for tasks assessed/performed          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   Heart murmur    Hemorrhoids 06/27/2008   Hiatal hernia    1 cm hill grade 2    History of adenomatous colonic polyps 2004   History of migraines    Hyperlipidemia 09/27/2006   Hypertension    IBS (irritable bowel syndrome)    Knee pain 05/08/2023   Mild cognitive impairment with memory loss 01/23/2024   Neck pain 01/09/2023   Obstructive sleep apnea 09/27/2006   Osteoarthritis    Other fatigue 01/09/2023   Skin lesion 03/16/2023   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   Past Surgical History:  Procedure Laterality Date   CATARACT EXTRACTION, BILATERAL  2021   CHOLECYSTECTOMY     colonoscopy and endoscopy     COLONOSCOPY WITH PROPOFOL  N/A 07/26/2018   Procedure: COLONOSCOPY WITH PROPOFOL ;  Surgeon: Toledo, Ladell POUR, MD;  Location: [facility] ENDOSCOPY;  Service: Gastroenterology;  Laterality: N/A;   ESOPHAGOGASTRODUODENOSCOPY (EGD) WITH PROPOFOL  N/A 07/26/2018   Procedure: ESOPHAGOGASTRODUODENOSCOPY (EGD) WITH PROPOFOL ;  Surgeon: Toledo, Ladell POUR, MD;  Location: [facility] ENDOSCOPY;  Service: Gastroenterology;  Laterality: N/A;   Patient Active  Problem List   Diagnosis Date Noted   Acute right hip pain 01/24/2024   Mild cognitive impairment with memory loss 01/23/2024   Knee pain 05/08/2023   Neck pain 01/09/2023   Other fatigue 01/09/2023   Arthralgia 07/28/2022   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 Arlyss Solian   REFERRING PROVIDER: Dr. Arlyss Solian   REFERRING DIAG: [phone] (ICD-10-CM) - Tendinitis of right hip flexor  THERAPY DIAG:  No diagnosis found.  Rationale for Evaluation and Treatment: Rehabilitation  ONSET DATE: Occurred 8 years ago  SUBJECTIVE:   SUBJECTIVE STATEMENT: See pertinent history    PERTINENT HISTORY: Pt reports having pain in the lateral side of his right hip about 8 years and he found relief from a steroid joint injection. It was feeling good up until recently, like 10 days ago. He experiences pain mostly when laying on his right side when sleeping and relief when laying on his left side.  He reports that he also has right wrist pain that occurred about 10 days ago and it is a constant pain that is located in the anatomical snuff box.   PAIN:  Are you having pain? Yes: NPRS scale: 5-6/10   Pain location: greater trochanter of right hip Pain description: Feels like it is locking up on him   Aggravating factors: Doing yard work   Relieving factors: Sleeping on left side or off side he is feeling pain on     PRECAUTIONS: None  RED FLAGS: None   WEIGHT BEARING RESTRICTIONS: No  FALLS:  Has patient fallen in last 6 months? No  LIVING ENVIRONMENT: Lives with: lives with their family Lives in: House/apartment Stairs: Yes: Internal: 13 steps; on right going up and on left going up and External: 6 steps; on right going up Has following equipment at home: None and grab bars inside his bathroom     OCCUPATION: Retired   PLOF: Independent  PATIENT GOALS: He  NEXT MD VISIT: Did not ask    OBJECTIVE:  Note: Objective measures were completed at Evaluation unless otherwise noted.  VITALS BP 144/76 HR 70 SpO2 100%  DIAGNOSTIC FINDINGS:   CLINICAL DATA:  Right hip pain.   EXAM: DG HIP (WITH OR WITHOUT PELVIS) 2-3V RIGHT   COMPARISON:  None Available.   FINDINGS: There is no evidence of hip fracture or dislocation. Mild narrowing of right hip is noted.   IMPRESSION: Mild osteoarthritis of right hip.  No acute abnormality seen.     Electronically Signed   By: Lynwood Landy Raddle M.D.   On: 02/22/2024 12:35    PATIENT SURVEYS:  HOOS Jr   9/28  or 62%  COGNITION: Overall cognitive status: Within functional limits for tasks assessed     SENSATION: WFL  EDEMA: Not performed    MUSCLE LENGTH: Hamstrings: Right 60 deg; Left 60  deg Thomas test: Right *** deg; Left *** deg   PALPATION: L  LUMBAR AROM: WNL and no pain elicited   LOWER EXTREMITY ROM:  Active ROM Right eval Left eval  Hip flexion    Hip extension    Hip abduction    Hip adduction    Hip internal rotation    Hip external rotation    Knee flexion    Knee extension    Ankle dorsiflexion    Ankle plantarflexion    Ankle inversion    Ankle eversion     (Blank rows = not tested)  LOWER EXTREMITY MMT:  MMT Right eval Left eval  Hip flexion 4* 4+  Hip extension    Hip abduction    Hip adduction    Hip internal rotation    Hip external rotation    Knee flexion    Knee extension    Ankle dorsiflexion    Ankle plantarflexion    Ankle inversion    Ankle eversion     (Blank rows = not tested)  LOWER EXTREMITY SPECIAL TESTS:  Hip special tests: Belvie (FABER) test: {pos/neg:25230}, Ely's test: {pos/neg:25230}, and Anterior hip impingement test: {pos/neg:25230}  FUNCTIONAL TESTS:  {Functional tests:24029}  GAIT: Distance walked: *** Assistive device utilized: {Assistive  devices:23999} Level of assistance: {Levels of assistance:24026} Comments: ***  TREATMENT DATE: ***    PATIENT EDUCATION:  Education details: *** Person educated: {Person educated:25204} Education method: {Education Method:25205} Education comprehension: {Education Comprehension:25206}  HOME EXERCISE PROGRAM: Access Code: [email or web address] URL: [email or web address] Date: 03/06/2024 Prepared by: Toribio Servant  Exercises - Seated Hamstring Stretch  - 1 x daily - 7 x weekly - 3 reps - 60 sec hold - Hooklying Single Knee to Chest Stretch  - 1 x daily - 7 x weekly - 2 sets - 10 reps - 3 sec hold  ASSESSMENT:  CLINICAL IMPRESSION: Patient is a *** y.o. *** who was seen today for physical therapy evaluation and treatment for ***.   OBJECTIVE IMPAIRMENTS: {opptimpairments:25111}.   ACTIVITY LIMITATIONS: {activitylimitations:27494}  PARTICIPATION LIMITATIONS: {participationrestrictions:25113}  PERSONAL FACTORS: {Personal factors:25162} are also affecting patient's functional outcome.   REHAB POTENTIAL: {rehabpotential:25112}  CLINICAL DECISION MAKING: {clinical decision making:25114}  EVALUATION COMPLEXITY: {Evaluation complexity:25115}   GOALS: Goals reviewed with patient? {yes/no:20286}  SHORT TERM GOALS: Target date: *** *** Baseline: Goal status: INITIAL  2.  *** Baseline:  Goal status: INITIAL  3.  *** Baseline:  Goal status: INITIAL  4.  *** Baseline:  Goal status: INITIAL  5.  *** Baseline:  Goal status: INITIAL  6.  *** Baseline:  Goal status: INITIAL  LONG TERM GOALS: Target date: 05/29/2024    *** Baseline:  Goal status: INITIAL  2.  *** Baseline:  Goal status: INITIAL  3.  *** Baseline:  Goal status: INITIAL  4.  *** Baseline:  Goal status: INITIAL  5.  *** Baseline:  Goal status:  INITIAL  6.  *** Baseline:  Goal status: INITIAL   PLAN:  PT FREQUENCY: {rehab frequency:25116}  PT DURATION: {rehab duration:25117}  PLANNED INTERVENTIONS: {rehab planned interventions:25118::97110-Therapeutic exercises,97530- Therapeutic [phone]- Neuromuscular re-education,97535- Self Rjmz,02859- Manual therapy}  PLAN FOR NEXT SESSION: ***   Toribio JINNY Servant, PT 03/06/2024, 5:47 PM

## 2024-03-06 NOTE — Telephone Encounter (Signed)
 I contacted [facility] they will contact patient to have open [email or web address]

## 2024-03-07 ENCOUNTER — Ambulatory Visit: Admitting: Physical Therapy

## 2024-03-07 ENCOUNTER — Telehealth: Payer: Self-pay

## 2024-03-07 NOTE — Telephone Encounter (Signed)
 Kyle Sanchez, pls see below. I see an order for open MRI was sent to [facility]. Can you pls check if [facility] on [address] that patient requests is open? I am not sure. If it is not, pls confirm with patient where he wants MRI done. Thanks

## 2024-03-07 NOTE — Progress Notes (Signed)
 Agree.  Thanks.  Arlyss EDISON Cleatus, MD 03/07/24  3:00 PM

## 2024-03-07 NOTE — Telephone Encounter (Signed)
 Copied from CRM #[id number]. Topic: General - Other >> Mar 06, 2024 12:15 PM Thersia C wrote: Reason for CRM: Patient was suppose to have MRI , canceled because it was closed and will like it to be sent to   [facility]  [address] [place], [place] [address]  Phone [phone] Fax [phone]

## 2024-03-07 NOTE — Telephone Encounter (Signed)
 Please let patient know that the orders are getting set up through neuro. I routed this for input and they should be able to help him get set up.  I do not know if this location has what he needs.  I greatly appreciate the help of all involved.

## 2024-03-07 NOTE — Telephone Encounter (Signed)
 I gave patient number to call [facility] [phone] to get open MRI scheduled. [facility] does not have open MRI machine.

## 2024-03-07 NOTE — Telephone Encounter (Signed)
 This has been open at [facility] open [facility]. Will call patient back to ask if he has any questions.

## 2024-03-13 ENCOUNTER — Encounter: Payer: Self-pay | Admitting: Physical Therapy

## 2024-03-13 ENCOUNTER — Ambulatory Visit: Attending: Family Medicine | Admitting: Physical Therapy

## 2024-03-13 DIAGNOSIS — M25551 Pain in right hip: Secondary | ICD-10-CM | POA: Diagnosis not present

## 2024-03-13 NOTE — Therapy (Signed)
 OUTPATIENT PHYSICAL THERAPY LOWER EXTREMITY TREATMENT     Patient Name: Kyle Sanchez MRN: [id number] DOB:10/12/1945, 78 y.o., male Today's Date: 03/13/2024  END OF SESSION:  PT End of Session - 03/13/24 1649     Visit Number 2    Number of Visits 24    Date for PT Re-Evaluation 05/29/24    Authorization Type [employer]    Authorization - Visit Number 2    Authorization - Number of Visits 24    Progress Note Due on Visit 10    PT Start Time 1645    PT Stop Time 1730    PT Time Calculation (min) 45 min    Activity Tolerance Patient limited by pain    Behavior During Therapy [facility] for tasks assessed/performed           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   Heart murmur    Hemorrhoids 06/27/2008   Hiatal hernia    1 cm hill grade 2    History of adenomatous colonic polyps 2004   History of migraines    Hyperlipidemia 09/27/2006   Hypertension    IBS (irritable bowel syndrome)    Knee pain 05/08/2023   Mild cognitive impairment with memory loss 01/23/2024   Neck pain 01/09/2023   Obstructive sleep apnea 09/27/2006   Osteoarthritis    Other fatigue 01/09/2023   Skin lesion 03/16/2023   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   Past Surgical History:  Procedure Laterality Date   CATARACT EXTRACTION, BILATERAL  2021   CHOLECYSTECTOMY     colonoscopy and endoscopy     COLONOSCOPY WITH PROPOFOL  N/A 07/26/2018   Procedure: COLONOSCOPY WITH PROPOFOL ;  Surgeon: Toledo, Ladell POUR, MD;  Location: [facility] ENDOSCOPY;  Service: Gastroenterology;  Laterality: N/A;   ESOPHAGOGASTRODUODENOSCOPY (EGD) WITH PROPOFOL  N/A 07/26/2018   Procedure: ESOPHAGOGASTRODUODENOSCOPY (EGD) WITH PROPOFOL ;  Surgeon: Toledo, Ladell POUR, MD;  Location: [facility] ENDOSCOPY;  Service: Gastroenterology;  Laterality: N/A;   Patient  Active Problem List   Diagnosis Date Noted   Acute right hip pain 01/24/2024   Mild cognitive impairment with memory loss 01/23/2024   Knee pain 05/08/2023   Neck pain 01/09/2023   Other fatigue 01/09/2023   Arthralgia 07/28/2022   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 Arlyss Solian   REFERRING PROVIDER: Dr. Arlyss Solian   REFERRING DIAG: [phone] (ICD-10-CM) - Tendinitis of right hip flexor  THERAPY DIAG:  No diagnosis found.  Rationale for Evaluation and Treatment: Rehabilitation  ONSET DATE: Occurred 8 years ago  SUBJECTIVE:   SUBJECTIVE STATEMENT: Pt states that he continues to experience lateral hip pain in his right hip and it feels about the same as last time.    PERTINENT HISTORY: Pt reports having pain in the lateral side of his right hip about 8 years and he found relief from a steroid joint injection. It was feeling good up until recently, like 10 days ago. He experiences pain mostly when laying on his right side when sleeping and relief when laying on his left side.  He reports that he also has right wrist pain that  occurred about 10 days ago and it is a constant pain that is located in the anatomical snuff box and that has worsened simultaneously with his right hip pain. He remains relatively active exercising regularly and doing work around his house, and he has been limited in doing this by his right hip pain.   PAIN:  Are you having pain? Yes: NPRS scale: 5-6/10   Pain location: greater trochanter of right hip Pain description: Feels like it is locking up on him   Aggravating factors: Doing yard work   Relieving factors: Sleeping on left side or off side he is feeling pain on     PRECAUTIONS: None  RED FLAGS: None   WEIGHT BEARING RESTRICTIONS: No  FALLS:  Has patient fallen in  last 6 months? No  LIVING ENVIRONMENT: Lives with: lives with their family Lives in: House/apartment Stairs: Yes: Internal: 13 steps; on right going up and on left going up and External: 6 steps; on right going up Has following equipment at home: None and grab bars inside his bathroom    OCCUPATION: Retired   PLOF: Independent  PATIENT GOALS: He wants to know what exercises to do in order to decrease the hip pain.   NEXT MD VISIT: Did not ask    OBJECTIVE:  Note: Objective measures were completed at Evaluation unless otherwise noted.  VITALS BP 144/76 HR 70 SpO2 100%  DIAGNOSTIC FINDINGS:   CLINICAL DATA:  Right hip pain.   EXAM: DG HIP (WITH OR WITHOUT PELVIS) 2-3V RIGHT   COMPARISON:  None Available.   FINDINGS: There is no evidence of hip fracture or dislocation. Mild narrowing of right hip is noted.   IMPRESSION: Mild osteoarthritis of right hip.  No acute abnormality seen.     Electronically Signed   By: Lynwood Landy Raddle M.D.   On: 02/22/2024 12:35    PATIENT SURVEYS:  HOOS Jr   9/28  or 62%  COGNITION: Overall cognitive status: Within functional limits for tasks assessed     SENSATION: WFL  EDEMA: Not performed    MUSCLE LENGTH: Hamstrings: Right 60 deg; Left 60  deg Thomas test: Not performed    PALPATION: Right ASIS TTP    LUMBAR AROM: WNL and no pain elicited   LOWER EXTREMITY ROM:  Active ROM Right eval Left eval  Hip flexion    Hip extension    Hip abduction    Hip adduction    Hip internal rotation    Hip external rotation    Knee flexion    Knee extension    Ankle dorsiflexion    Ankle plantarflexion    Ankle inversion    Ankle eversion     (Blank rows = not tested)  LOWER EXTREMITY MMT:  MMT Right eval Left eval  Hip flexion 4-* 4+  Hip extension 4 4  Hip abduction 4- 4-  Hip adduction    Hip internal rotation 4- 4-    Hip external rotation 4 4  Knee flexion 4 4  Knee extension 4 4  Ankle dorsiflexion 4 4   Ankle plantarflexion    Ankle inversion    Ankle eversion     (Blank rows = not tested)  LOWER EXTREMITY SPECIAL TESTS:  Hip special tests: Belvie (FABER) test: positive , Ely's test: negative, and Anterior hip impingement test: positive   FUNCTIONAL TESTS:  Squat: NT    GAIT: Distance walked: 30 ft   Assistive device utilized: None Level of assistance: Complete Independence  Comments: No gait deficits noted                                                                                                                                  TREATMENT DATE: 03/13/24  THEREX   Matrix recumbent bicycle with seat at 10 and resistance at 2 for 5 min  Hip IR R/L AROM 20/25   Hip IR R/L PROM 35/40  Hip ER R/L AROM 15/15    Hip ER R/L PROM 25/30   Seated Hip ER Stretch on RLE 2 x 60 sec    -Pt reports increased lateral joint pain   Seated Hip ER Stretch on LLE 2 x 60 sec   Figure 4 Stretch on RLE 2 x 60 sec    -Pt reports lateral hip pain   Figure 4 stretch  with knee at 30 deg flexion  1 x 60 sec    -pt reports ongoing lateral right sided hip pain   Bound Angle Hands Forward  2 x 30 sec  Modified Thomas Stretch on RLE with #5 lb AW   3 x 60 sec     Palpable: anterior tendon of right hip along lateral quadricep along proximal insertion near ASIS  TTP     PATIENT EDUCATION:  Education details: Form and technique for correct performance of exercise and explanation about signs and symptoms of hip OA   Person educated: Patient Education method: Programmer, multimedia, Demonstration, Verbal cues, and Handouts Education comprehension: verbalized understanding, returned demonstration, and verbal cues required  HOME EXERCISE PROGRAM: Access Code: [email or web address] URL: [email or web address] Date: 03/13/2024 Prepared by: Toribio Servant  Exercises - Seated Hamstring Stretch  - 1 x daily - 7 x weekly - 3 reps - 60 sec hold - Hooklying Single Knee to Chest Stretch  - 1 x daily - 7 x weekly - 2  sets - 10 reps - 3 sec hold - Bound Angle Hands Forward   - 1 x daily - 7 x weekly - 3 reps - 60 sec  hold - Modified Thomas Stretch  - 1 x daily - 7 x weekly - 3 reps -  60 secsec hold  ASSESSMENT:  CLINICAL IMPRESSION: Pt presents for initial treatment following evaluation for right hip pain. He continues to show decreased hip mobility along with increased right lateral hip pain with internal rotation and hip flexion. These are further signs of hip OA. He continues to be limited by right hip pain especially with hip IR and hip flexion with an extended knee. He did not experience an increase pain with flexed knee indicating potential psoas component. Continued to focus on improved hip mobility with ongoing stretches. He will continue to benefit from skilled PT to improve right hip mobility and strength and to decrease right hip pain to return to walking, standing and bending without feeling excessive pain and discomfort.     OBJECTIVE IMPAIRMENTS: Abnormal gait, difficulty walking, decreased ROM, decreased  strength, hypomobility, impaired flexibility, and pain.   ACTIVITY LIMITATIONS: carrying, lifting, bending, standing, squatting, and sleeping  PARTICIPATION LIMITATIONS: yard work  PERSONAL FACTORS: Age, Fitness, Time since onset of injury/illness/exacerbation, and 1-2 comorbidities: Asthma and Mild Cognitive Impairment are also affecting patient's functional outcome.   REHAB POTENTIAL: Fair chronicity of condition   CLINICAL DECISION MAKING: Stable/uncomplicated  EVALUATION COMPLEXITY: Low   GOALS: Goals reviewed with patient? No  SHORT TERM GOALS: Target date: 03/20/2024  Patient will demonstrate undestanding of home exercise plan by performing exercises correctly with evidence of good carry over with min to no verbal or tactile cues .   Baseline: NT 03/13/24: Performing independently   Goal status: ACHIEVED      LONG TERM GOALS: Target date: 05/29/2024   Patient will show an  improvement in her right hip function as evidenced by an improvement in her HOOS Jr score  >=18 pts.  Baseline: 62% OR 9/28    Goal status: ONGOING    2.  Patient will improve right hip strength by  >=1/3 grade on MMT scale (ie 4- to 4) for improved right hip stability and function to return to bending and stooping to maintain his yard without being limited by pain and discomfort.  Baseline: Hip Flex R/L 4-*/4, Hip Ext R/L4/4, Hip Abd R/L 4-/4-  Goal status: ONGOING     3.  Patient will decreased right hip pain to <=3/10 when working in his yard as evidence of improved right hip function and tissue healing.  Baseline: 4-6/10 NRPS in ASIS of right hip   Goal status: ONGOING     4.  Patient will show improved muscle length with hamstrings for improved pelvic position by being able to perform passive straight leg raise >=80 deg on BLE.  Baseline: SLR R/L 60/60 Goal status: ONGOING      PLAN:  PT FREQUENCY: 1-2x/week  PT DURATION: 12 weeks  PLANNED INTERVENTIONS: 97164- PT Re-evaluation, 97750- Physical Performance Testing, 97110-Therapeutic exercises, 97530- Therapeutic activity, [id number]- Neuromuscular re-education, 97535- Self Care, 02859- Manual therapy, [id number]- Gait training, [phone]- Canalith repositioning, H9716- Electrical stimulation (unattended), [phone]- Electrical stimulation (manual), [id number]- Traction (mechanical), 20560 (1-2 muscles), 20561 (3+ muscles)- Dry Needling, Patient/Family education, Balance training, Stair training, Taping, Joint mobilization, Joint manipulation, Spinal manipulation, Spinal mobilization, DME instructions, Cryotherapy, and Moist heat  PLAN FOR NEXT SESSION: Gait analysis. Observation of full squat to see position of knees and hips. Measure hip IR and ER A/PROM. Palpate further to determine which muscle or structure pain is localized too.   Toribio Servant PT, DPT  [facility] [address], [place],  [address] Phone: [phone]   Fax:  [phone]

## 2024-03-20 ENCOUNTER — Ambulatory Visit: Payer: Self-pay

## 2024-03-20 ENCOUNTER — Other Ambulatory Visit: Payer: Self-pay | Admitting: Family Medicine

## 2024-03-20 ENCOUNTER — Institutional Professional Consult (permissible substitution): Admitting: Psychology

## 2024-03-20 ENCOUNTER — Ambulatory Visit: Admitting: Physical Therapy

## 2024-03-20 DIAGNOSIS — M25551 Pain in right hip: Secondary | ICD-10-CM

## 2024-03-20 NOTE — Therapy (Signed)
 OUTPATIENT PHYSICAL THERAPY LOWER EXTREMITY TREATMENT     Patient Name: Kyle Sanchez MRN: [id number] DOB:[birth year], 78 y.o., male Today's Date: 03/20/2024  END OF SESSION:  PT End of Session - 03/20/24 1644     Visit Number 3    Number of Visits 24    Date for PT Re-Evaluation 05/29/24    Authorization Type [employer]    Authorization - Visit Number 3    Authorization - Number of Visits 24    Progress Note Due on Visit 10    PT Start Time 1645    PT Stop Time 1730    PT Time Calculation (min) 45 min    Activity Tolerance Patient limited by pain    Behavior During Therapy [facility] for tasks assessed/performed            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   Heart murmur    Hemorrhoids 06/27/2008   Hiatal hernia    1 cm hill grade 2    History of adenomatous colonic polyps 2004   History of migraines    Hyperlipidemia 09/27/2006   Hypertension    IBS (irritable bowel syndrome)    Knee pain 05/08/2023   Mild cognitive impairment with memory loss 01/23/2024   Neck pain 01/09/2023   Obstructive sleep apnea 09/27/2006   Osteoarthritis    Other fatigue 01/09/2023   Skin lesion 03/16/2023   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   Past Surgical History:  Procedure Laterality Date   CATARACT EXTRACTION, BILATERAL  2021   CHOLECYSTECTOMY     colonoscopy and endoscopy     COLONOSCOPY WITH PROPOFOL  N/A 07/26/2018   Procedure: COLONOSCOPY WITH PROPOFOL ;  Surgeon: Toledo, Ladell POUR, MD;  Location: [facility];  Service: Gastroenterology;  Laterality: N/A;   ESOPHAGOGASTRODUODENOSCOPY (EGD) WITH PROPOFOL  N/A 07/26/2018   Procedure: ESOPHAGOGASTRODUODENOSCOPY (EGD) WITH PROPOFOL ;  Surgeon: Toledo, Ladell POUR, MD;  Location: [facility] ENDOSCOPY;  Service: Gastroenterology;  Laterality: N/A;   Patient  Active Problem List   Diagnosis Date Noted   Acute right hip pain 01/24/2024   Mild cognitive impairment with memory loss 01/23/2024   Knee pain 05/08/2023   Neck pain 01/09/2023   Other fatigue 01/09/2023   Arthralgia 07/28/2022   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 Arlyss Solian   REFERRING PROVIDER: Dr. Arlyss Solian   REFERRING DIAG: [phone] (ICD-10-CM) - Tendinitis of right hip flexor  THERAPY DIAG:  Pain in right hip  Rationale for Evaluation and Treatment: Rehabilitation  ONSET DATE: Occurred 8 years ago  SUBJECTIVE:   SUBJECTIVE STATEMENT: Pt reports ongoing right hip pain and now he is experiencing increased right wrist and knee pain. He continues to experience it mostly when sitting down from a standing position and when standing up from a seated position.   PERTINENT HISTORY: Pt reports having pain in the lateral side of his right hip about 8 years and he found relief from a steroid joint injection. It was feeling good up until recently, like 10 days ago. He experiences pain mostly when laying on his right side when sleeping and relief when  laying on his left side.  He reports that he also has right wrist pain that occurred about 10 days ago and it is a constant pain that is located in the anatomical snuff box and that has worsened simultaneously with his right hip pain. He remains relatively active exercising regularly and doing work around his house, and he has been limited in doing this by his right hip pain.   PAIN:  Are you having pain? Yes: NPRS scale: 5-6/10   Pain location: greater trochanter of right hip Pain description: Feels like it is locking up on him   Aggravating factors: Doing yard work   Relieving factors: Sleeping on left side or off side he is feeling pain on      PRECAUTIONS: None  RED FLAGS: None   WEIGHT BEARING RESTRICTIONS: No  FALLS:  Has patient fallen in last 6 months? No  LIVING ENVIRONMENT: Lives with: lives with their family Lives in: House/apartment Stairs: Yes: Internal: 13 steps; on right going up and on left going up and External: 6 steps; on right going up Has following equipment at home: None and grab bars inside his bathroom    OCCUPATION: Retired   PLOF: Independent  PATIENT GOALS: He wants to know what exercises to do in order to decrease the hip pain.   NEXT MD VISIT: Did not ask    OBJECTIVE:  Note: Objective measures were completed at Evaluation unless otherwise noted.  VITALS BP 144/76 HR 70 SpO2 100%  DIAGNOSTIC FINDINGS:   CLINICAL DATA:  Right hip pain.   EXAM: DG HIP (WITH OR WITHOUT PELVIS) 2-3V RIGHT   COMPARISON:  None Available.   FINDINGS: There is no evidence of hip fracture or dislocation. Mild narrowing of right hip is noted.   IMPRESSION: Mild osteoarthritis of right hip.  No acute abnormality seen.     Electronically Signed   By: Lynwood Landy Raddle M.D.   On: 02/22/2024 12:35    PATIENT SURVEYS:  HOOS Jr   9/28  or 62%  COGNITION: Overall cognitive status: Within functional limits for tasks assessed     SENSATION: WFL  EDEMA: Not performed    MUSCLE LENGTH: Hamstrings: Right 60 deg; Left 60  deg Thomas test: Not performed    PALPATION: Right ASIS TTP    LUMBAR AROM: WNL and no pain elicited   LOWER EXTREMITY ROM:  Active ROM Right eval Left eval  Hip flexion    Hip extension    Hip abduction    Hip adduction    Hip internal rotation    Hip external rotation    Knee flexion    Knee extension    Ankle dorsiflexion    Ankle plantarflexion    Ankle inversion    Ankle eversion     (Blank rows = not tested)  LOWER EXTREMITY MMT:  MMT Right eval Left eval  Hip flexion 4-* 4+  Hip extension 4 4  Hip abduction 4- 4-  Hip adduction    Hip internal  rotation 4- 4-    Hip external rotation 4 4  Knee flexion 4 4  Knee extension 4 4  Ankle dorsiflexion 4 4  Ankle plantarflexion    Ankle inversion    Ankle eversion     (Blank rows = not tested)  LOWER EXTREMITY SPECIAL TESTS:  Hip special tests: Belvie (FABER) test: positive , Ely's test: negative, and Anterior hip impingement test: positive   FUNCTIONAL TESTS:  Squat: NT  GAIT: Distance walked: 30 ft   Assistive device utilized: None Level of assistance: Complete Independence Comments: No gait deficits noted                                                                                                                                  TREATMENT DATE: 03/13/24  THEREX    Matrix recumbent bicycle with seat at 10 and resistance at 2 for 5 min  Modified SLR on RLE  1 x 10   -Pt reports an increased in anterior hip pain SLR with PT lifting pt's right hip passively 1 x 5   Eccentric Hip Flexion on RLE with extended knee #3 AW 1 x 10    Eccentric Hip Flexion on RLE with flexed knee #3 AW  1 x 10   Modified Thomas Position- Eccentric Hip Flexion on RLE with AROM 1 x 10  Modified Thomas Position- Eccentric Hip Flexion on RLE with #2 lb AW 1 x 10   Supine Eccentric Hip Flexion on RLE with yellow band 2 x 10   -min VC to decrease speed of eccentric phase of exercise  Squat past 90 deg hip and knee flexion  -Pt reports increased right anterior hip pain   Gait Analysis: no gait deficits noted      PATIENT EDUCATION:  Education details: Form and technique for correct performance of exercise and explanation about signs and symptoms of hip OA   Person educated: Patient Education method: Explanation, Demonstration, Verbal cues, and Handouts Education comprehension: verbalized understanding, returned demonstration, and verbal cues required  HOME EXERCISE PROGRAM: Access Code: [email or web address] URL: [email or web address] Date: 03/20/2024 Prepared by: Toribio Servant  Exercises - Seated Hamstring Stretch  - 1 x daily - 7 x weekly - 3 reps - 60 sec hold - Bound Angle Hands Forward   - 1 x daily - 7 x weekly - 3 reps - 60 sec  hold - Modified Thomas Stretch  - 1 x daily - 7 x weekly - 3 reps -  60 secsec hold - Supine Hip Flexion with Resistance Loop  - 3-4 x weekly - 3 sets - 10 reps  ASSESSMENT:  CLINICAL IMPRESSION: Pt shows signs and symptoms of right hip tendonitis with anterior hip pain that worsens with active range of motion and that does not increase with passive range of motion. He was able to tolerate all eccentric hip flexion exercises without being limited by pain. He exhibits improvement in right sided antalgic gait with symmetrical step length and stance time, which is an improvement from last sessions. He will continue to benefit from skilled PT to improve right hip mobility and strength and to decrease right hip pain to return to walking, standing and bending without feeling excessive pain and discomfort.      OBJECTIVE IMPAIRMENTS: Abnormal gait, difficulty walking, decreased ROM, decreased strength, hypomobility, impaired flexibility, and pain.   ACTIVITY LIMITATIONS: carrying, lifting, bending, standing,  squatting, and sleeping  PARTICIPATION LIMITATIONS: yard work  PERSONAL FACTORS: Age, Fitness, Time since onset of injury/illness/exacerbation, and 1-2 comorbidities: Asthma and Mild Cognitive Impairment are also affecting patient's functional outcome.   REHAB POTENTIAL: Fair chronicity of condition   CLINICAL DECISION MAKING: Stable/uncomplicated  EVALUATION COMPLEXITY: Low   GOALS: Goals reviewed with patient? No  SHORT TERM GOALS: Target date: 03/20/2024  Patient will demonstrate undestanding of home exercise plan by performing exercises correctly with evidence of good carry over with min to no verbal or tactile cues .   Baseline: NT 03/13/24: Performing independently   Goal status: ACHIEVED      LONG TERM GOALS:  Target date: 05/29/2024   Patient will show an improvement in her right hip function as evidenced by an improvement in her HOOS Jr score  >=18 pts.  Baseline: 62% OR 9/28    Goal status: ONGOING    2.  Patient will improve right hip strength by  >=1/3 grade on MMT scale (ie 4- to 4) for improved right hip stability and function to return to bending and stooping to maintain his yard without being limited by pain and discomfort.  Baseline: Hip Flex R/L 4-*/4, Hip Ext R/L4/4, Hip Abd R/L 4-/4-  Goal status: ONGOING     3.  Patient will decreased right hip pain to <=3/10 when working in his yard as evidence of improved right hip function and tissue healing.  Baseline: 4-6/10 NRPS in ASIS of right hip   Goal status: ONGOING     4.  Patient will show improved muscle length with hamstrings for improved pelvic position by being able to perform passive straight leg raise >=80 deg on BLE.  Baseline: SLR R/L 60/60 Goal status: ONGOING      PLAN:  PT FREQUENCY: 1-2x/week  PT DURATION: 12 weeks  PLANNED INTERVENTIONS: 97164- PT Re-evaluation, 97750- Physical Performance Testing, 97110-Therapeutic exercises, 97530- Therapeutic activity, [id number]- Neuromuscular re-education, 97535- Self Care, [id number]- Manual therapy, [id number]- Gait training, [phone]- Canalith repositioning, H9716- Electrical stimulation (unattended), [id number]- Electrical stimulation (manual), [id number]- Traction (mechanical), 20560 (1-2 muscles), 20561 (3+ muscles)- Dry Needling, Patient/Family education, Balance training, Stair training, Taping, Joint mobilization, Joint manipulation, Spinal manipulation, Spinal mobilization, DME instructions, Cryotherapy, and Moist heat  PLAN FOR NEXT SESSION: Hip abductor strengthening  and continue to progress eccentric hip flexor strengthening.   Toribio Servant PT, DPT  [facility] [address] [place], [address] Phone: [phone]   Fax:  [phone]

## 2024-03-22 NOTE — Telephone Encounter (Signed)
 Last filled on 02/22/24 #60 tab/ 0 refills   Last OV was for hip pain on 03/01/24

## 2024-03-26 ENCOUNTER — Ambulatory Visit: Payer: Self-pay

## 2024-03-26 ENCOUNTER — Ambulatory Visit: Admitting: Physical Therapy

## 2024-03-26 DIAGNOSIS — M25551 Pain in right hip: Secondary | ICD-10-CM

## 2024-03-26 NOTE — Telephone Encounter (Signed)
 FYI Only or Action Required?: FYI only for provider.  Patient was last seen in primary care on 03/01/2024 by Cleatus Arlyss RAMAN, MD.  Called Nurse Triage reporting Shoulder Pain.  Symptoms began several days ago.  Interventions attempted: Nothing.  Symptoms are: unchanged.  Triage Disposition: See PCP When Office is Open (Within 3 Days)  Patient/caregiver understands and will follow disposition?: Yes      Copied from CRM #[id number]. Topic: Clinical - Red Word Triage >> Mar 26, 2024  8:40 AM Charlet HERO wrote: Red Word that prompted transfer to Nurse Triage: Patients wife is calling about patient has pain from shoulder down to his right side, he is taking med and it is not helping has been for a week.Dr Cleatus [facility]. Reason for Disposition  [1] MODERATE pain (e.g., interferes with normal activities) AND [2] present > 3 days  Answer Assessment - Initial Assessment Questions 1. ONSET: When did the pain start?     2-3 days ago 2. LOCATION: Where is the pain located?     Right shoulder 3. PAIN: How bad is the pain? (Scale 1-10; or mild, moderate, severe)     severe 4. WORK OR EXERCISE: Has there been any recent work or exercise that involved this part of the body?     denies 5. CAUSE: What do you think is causing the shoulder pain?     unknown 6. OTHER SYMPTOMS: Do you have any other symptoms? (e.g., neck pain, swelling, rash, fever, numbness, weakness)     Hx arthritis 7. PREGNANCY: Is there any chance you are pregnant? When was your last menstrual period?     na  Protocols used: Shoulder Pain-A-AH

## 2024-03-26 NOTE — Telephone Encounter (Signed)
Noted,will see tomorrow. Thanks.

## 2024-03-26 NOTE — Therapy (Signed)
 OUTPATIENT PHYSICAL THERAPY LOWER EXTREMITY TREATMENT     Patient Name: Kyle Sanchez MRN: [id number] DOB:1946-06-28, 78 y.o., male Today's Date: 03/26/2024  END OF SESSION:  PT End of Session - 03/26/24 1039     Visit Number 4    Number of Visits 24    Date for PT Re-Evaluation 05/29/24    Authorization Type [employer]    Authorization - Visit Number 4    Authorization - Number of Visits 24    Progress Note Due on Visit 10    PT Start Time 1035    PT Stop Time 1115    PT Time Calculation (min) 40 min    Activity Tolerance Patient limited by pain    Behavior During Therapy [facility] for tasks assessed/performed            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   Heart murmur    Hemorrhoids 06/27/2008   Hiatal hernia    1 cm hill grade 2    History of adenomatous colonic polyps 2004   History of migraines    Hyperlipidemia 09/27/2006   Hypertension    IBS (irritable bowel syndrome)    Knee pain 05/08/2023   Mild cognitive impairment with memory loss 01/23/2024   Neck pain 01/09/2023   Obstructive sleep apnea 09/27/2006   Osteoarthritis    Other fatigue 01/09/2023   Skin lesion 03/16/2023   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   Past Surgical History:  Procedure Laterality Date   CATARACT EXTRACTION, BILATERAL  2021   CHOLECYSTECTOMY     colonoscopy and endoscopy     COLONOSCOPY WITH PROPOFOL  N/A 07/26/2018   Procedure: COLONOSCOPY WITH PROPOFOL ;  Surgeon: Toledo, Ladell POUR, MD;  Location: [facility] ENDOSCOPY;  Service: Gastroenterology;  Laterality: N/A;   ESOPHAGOGASTRODUODENOSCOPY (EGD) WITH PROPOFOL  N/A 07/26/2018   Procedure: ESOPHAGOGASTRODUODENOSCOPY (EGD) WITH PROPOFOL ;  Surgeon: Toledo, Ladell POUR, MD;  Location: [facility] ENDOSCOPY;  Service: Gastroenterology;  Laterality: N/A;   Patient  Active Problem List   Diagnosis Date Noted   Acute right hip pain 01/24/2024   Mild cognitive impairment with memory loss 01/23/2024   Knee pain 05/08/2023   Neck pain 01/09/2023   Other fatigue 01/09/2023   Arthralgia 07/28/2022   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 Arlyss Solian   REFERRING PROVIDER: Dr. Arlyss Solian   REFERRING DIAG: [phone] (ICD-10-CM) - Tendinitis of right hip flexor  THERAPY DIAG:  Pain in right hip  Rationale for Evaluation and Treatment: Rehabilitation  ONSET DATE: Occurred 8 years ago  SUBJECTIVE:   SUBJECTIVE STATEMENT: Pt states experiencing increased right side joint pain in his wrist, hip , and knee. He also describes feeling as though he had a cold since Saturday including feeling tired.  He does have a history of right wrist and hand arthritis documented in his chart from Dr. Watt.    PERTINENT HISTORY: Pt reports having pain in the lateral side of his right hip about 8 years and he found relief from a steroid joint injection. It was feeling good up until recently, like 10 days ago. He experiences pain  mostly when laying on his right side when sleeping and relief when laying on his left side.  He reports that he also has right wrist pain that occurred about 10 days ago and it is a constant pain that is located in the anatomical snuff box and that has worsened simultaneously with his right hip pain. He remains relatively active exercising regularly and doing work around his house, and he has been limited in doing this by his right hip pain.   PAIN:  Are you having pain? Yes: NPRS scale: 5-6/10   Pain location: greater trochanter of right hip Pain description: Feels like it is locking up on him   Aggravating factors: Doing yard work   Relieving factors: Sleeping on  left side or off side he is feeling pain on     PRECAUTIONS: None  RED FLAGS: None   WEIGHT BEARING RESTRICTIONS: No  FALLS:  Has patient fallen in last 6 months? No  LIVING ENVIRONMENT: Lives with: lives with their family Lives in: House/apartment Stairs: Yes: Internal: 13 steps; on right going up and on left going up and External: 6 steps; on right going up Has following equipment at home: None and grab bars inside his bathroom    OCCUPATION: Retired   PLOF: Independent  PATIENT GOALS: He wants to know what exercises to do in order to decrease the hip pain.   NEXT MD VISIT: Did not ask    OBJECTIVE:  Note: Objective measures were completed at Evaluation unless otherwise noted.  VITALS BP 144/76 HR 70 SpO2 100%  DIAGNOSTIC FINDINGS:   CLINICAL DATA:  Right hip pain.   EXAM: DG HIP (WITH OR WITHOUT PELVIS) 2-3V RIGHT   COMPARISON:  None Available.   FINDINGS: There is no evidence of hip fracture or dislocation. Mild narrowing of right hip is noted.   IMPRESSION: Mild osteoarthritis of right hip.  No acute abnormality seen.     Electronically Signed   By: Lynwood Landy Raddle M.D.   On: 02/22/2024 12:35    PATIENT SURVEYS:  HOOS Jr   9/28  or 62%  COGNITION: Overall cognitive status: Within functional limits for tasks assessed     SENSATION: WFL  EDEMA: Not performed    MUSCLE LENGTH: Hamstrings: Right 60 deg; Left 60  deg Thomas test: Not performed    PALPATION: Right ASIS TTP    LUMBAR AROM: WNL and no pain elicited   LOWER EXTREMITY ROM:  Active ROM Right eval Left eval  Hip flexion    Hip extension    Hip abduction    Hip adduction    Hip internal rotation    Hip external rotation    Knee flexion    Knee extension    Ankle dorsiflexion    Ankle plantarflexion    Ankle inversion    Ankle eversion     (Blank rows = not tested)  LOWER EXTREMITY MMT:  MMT Right eval Left eval  Hip flexion 4-* 4+  Hip extension 4 4  Hip  abduction 4- 4-  Hip adduction    Hip internal rotation 4- 4-    Hip external rotation 4 4  Knee flexion 4 4  Knee extension 4 4  Ankle dorsiflexion 4 4  Ankle plantarflexion    Ankle inversion    Ankle eversion     (Blank rows = not tested)  LOWER EXTREMITY SPECIAL TESTS:  Hip special tests: Belvie (FABER) test: positive , Ely's test: negative, and Anterior hip impingement  test: positive   FUNCTIONAL TESTS:  Squat: NT    GAIT: Distance walked: 30 ft   Assistive device utilized: None Level of assistance: Complete Independence Comments: No gait deficits noted                                                                                                                                  TREATMENT DATE:   03/26/24: THEREX   Nu-Step  with seat and arms at 9 for 6 min    Supine Eccentric Hip Flexion with yellow band on RLE 3 x 10   -min VC to decrease speed of lower the RLE back to mat Left Side Lying Clam Shell with red band on RLE  1 x 10   Left Side Lying Clam Shell with green band on RLE  1 x 10  Supine Abdominal Reaches 1 x 10  Supine Abdominal Crunches with crossed arms 1 x 10    90/90 supine heal touches  2 x 10       PATIENT EDUCATION:  Education details: Form and technique for correct performance of exercise and explanation about signs and symptoms of hip OA   Person educated: Patient Education method: Explanation, Demonstration, Verbal cues, and Handouts Education comprehension: verbalized understanding, returned demonstration, and verbal cues required  HOME EXERCISE PROGRAM: Access Code: [email or web address] URL: [email or web address] Date: 03/26/2024 Prepared by: Toribio Servant  Exercises - Seated Hamstring Stretch  - 1 x daily - 7 x weekly - 3 reps - 60 sec hold - Bound Angle Hands Forward   - 1 x daily - 7 x weekly - 3 reps - 60 sec  hold - Modified Thomas Stretch  - 1 x daily - 7 x weekly - 3 reps -  60 secsec hold - Supine Hip Flexion with  Resistance Loop (Mirrored)  - 3-4 x weekly - 3 sets - 10 reps - Clamshell with Resistance  - 3-4 x weekly - 3 sets - 10 reps  ASSESSMENT:  CLINICAL IMPRESSION: Despite high joint pain during session, pt was able to perform all exercises without being limited. However, pt did have considerably more pain compared to last sessions with signs and symptoms of further pain flare due to potential cold symptoms. PT also considered about non-MSK causes worsening his right sided multi-joint OA especially given his ongoing increased fatigue and pain that is localized to multiple joints. He does only have pain on right side, and not bilaterally indicating less likely RA, but he will continue to be monitored for symptoms going forward.  He will continue to benefit from skilled PT to improve right hip mobility and strength and to decrease right hip pain to return to walking, standing and bending without feeling excessive pain and discomfort.     OBJECTIVE IMPAIRMENTS: Abnormal gait, difficulty walking, decreased ROM, decreased strength, hypomobility, impaired flexibility, and pain.   ACTIVITY LIMITATIONS: carrying, lifting, bending, standing, squatting, and sleeping  PARTICIPATION LIMITATIONS:  yard work  PERSONAL FACTORS: Age, Fitness, Time since onset of injury/illness/exacerbation, and 1-2 comorbidities: Asthma and Mild Cognitive Impairment are also affecting patient's functional outcome.   REHAB POTENTIAL: Fair chronicity of condition   CLINICAL DECISION MAKING: Stable/uncomplicated  EVALUATION COMPLEXITY: Low   GOALS: Goals reviewed with patient? No  SHORT TERM GOALS: Target date: 03/20/2024  Patient will demonstrate undestanding of home exercise plan by performing exercises correctly with evidence of good carry over with min to no verbal or tactile cues .   Baseline: NT 03/13/24: Performing independently   Goal status: ACHIEVED      LONG TERM GOALS: Target date: 05/29/2024   Patient will  show an improvement in her right hip function as evidenced by an improvement in her HOOS Jr score  >=18 pts.  Baseline: 62% OR 9/28    Goal status: ONGOING    2.  Patient will improve right hip strength by  >=1/3 grade on MMT scale (ie 4- to 4) for improved right hip stability and function to return to bending and stooping to maintain his yard without being limited by pain and discomfort.  Baseline: Hip Flex R/L 4-*/4, Hip Ext R/L4/4, Hip Abd R/L 4-/4-  Goal status: ONGOING     3.  Patient will decreased right hip pain to <=3/10 when working in his yard as evidence of improved right hip function and tissue healing.  Baseline: 4-6/10 NRPS in ASIS of right hip   Goal status: ONGOING     4.  Patient will show improved muscle length with hamstrings for improved pelvic position by being able to perform passive straight leg raise >=80 deg on BLE.  Baseline: SLR R/L 60/60 Goal status: ONGOING      PLAN:  PT FREQUENCY: 1-2x/week  PT DURATION: 12 weeks  PLANNED INTERVENTIONS: 97164- PT Re-evaluation, 97750- Physical Performance Testing, 97110-Therapeutic exercises, 97530- Therapeutic activity, [id number]- Neuromuscular re-education, 97535- Self Care, [id number]- Manual therapy, [id number]- Gait training, [phone]- Canalith repositioning, H9716- Electrical stimulation (unattended), [phone]- Electrical stimulation (manual), [id number]- Traction (mechanical), 20560 (1-2 muscles), 20561 (3+ muscles)- Dry Needling, Patient/Family education, Balance training, Stair training, Taping, Joint mobilization, Joint manipulation, Spinal manipulation, Spinal mobilization, DME instructions, Cryotherapy, and Moist heat  PLAN FOR NEXT SESSION: Continue to focus on hip flexor eccentric exercises as well as abdominal- suitcase carry.  Begin to reassess some of his long term goals to gauge progress.   Toribio Servant PT, DPT  [facility] [address], [place], [address] Phone: [phone]    Fax:  [phone]

## 2024-03-27 ENCOUNTER — Ambulatory Visit (INDEPENDENT_AMBULATORY_CARE_PROVIDER_SITE_OTHER): Admitting: Family Medicine

## 2024-03-27 VITALS — BP 110/70 | HR 46 | Temp 98.1°F | Wt 161.0 lb

## 2024-03-27 DIAGNOSIS — M2550 Pain in unspecified joint: Secondary | ICD-10-CM

## 2024-03-27 DIAGNOSIS — G3184 Mild cognitive impairment, so stated: Secondary | ICD-10-CM

## 2024-03-27 MED ORDER — PREDNISONE 20 MG PO TABS: ORAL_TABLET | ORAL | 0 refills | Status: DC

## 2024-03-27 MED ORDER — MELOXICAM 7.5 MG PO TABS: 7.5000 mg | ORAL_TABLET | Freq: Two times a day (BID) | ORAL | Status: DC | PRN

## 2024-03-27 NOTE — Patient Instructions (Addendum)
 Please call [facility] [phone] about the MRI.   Prednisone  with food.  Stop meloxicam  while on prednisone .   Let me know if that isn't helping.  Take care.  Glad to see you.

## 2024-03-27 NOTE — Progress Notes (Unsigned)
 He had R arm and shoulder pain.  Pain from the shoulder down to the R arm and hand, to the fingertips.  Had redness on the R wrist, flexor and extensor side- that improved in the meantime.  Pain with making a fist.  No trigger.  No L sided sx.  Taking meloxicam  BID.  He tolerates BID dosing better than 15mg  every day. Not taking tramadol . He has occ R lateral occiput pain.   Recheck pulse ~60.   He hasn't had f/u MRI.  I gave him the number for [facility] [phone]   Meds, vitals, and allergies reviewed.   ROS: Per HPI unless specifically indicated in ROS section   Nad Ncat Neck supple, no LA Rrr ctab R wrist with pain on flex and ext.  Normal elbow ROM R shoulder pain with ext rotation but not int rotation Mild blanching redness proximal to R wrist on flexor side.  No fluctuant mass.

## 2024-03-28 ENCOUNTER — Telehealth: Payer: Self-pay | Admitting: Family Medicine

## 2024-03-28 NOTE — Assessment & Plan Note (Signed)
 Prednisone  with food.  Stop meloxicam  while on prednisone .  Steroid cautions discussed with patient.  Unclear if he has a gout flare at the wrist.  He also has impingement symptoms at the shoulder.  Either way, prednisone  would be a reasonable option.  At this point still okay for outpatient follow-up.  He can let me know if that isn't helping.

## 2024-03-28 NOTE — Telephone Encounter (Signed)
 Patient tried 2 times to have this Open MRI with contrast at [facility] was not able to to do this. FYI.

## 2024-03-28 NOTE — Telephone Encounter (Signed)
 Please check with neurology.  He has not had his MRI yet.  I gave him the contact number for [facility].  I do not know if the referral department needs to do anything else to help him get the MRI scheduled.  Thanks.

## 2024-03-28 NOTE — Assessment & Plan Note (Signed)
 He hasn't had f/u MRI.  I gave him the number for [facility] [phone]  See following phone note.

## 2024-03-29 ENCOUNTER — Ambulatory Visit: Admitting: Physical Therapy

## 2024-03-29 ENCOUNTER — Encounter: Payer: Self-pay | Admitting: Physical Therapy

## 2024-03-29 DIAGNOSIS — M25551 Pain in right hip: Secondary | ICD-10-CM

## 2024-03-29 NOTE — Therapy (Signed)
 OUTPATIENT PHYSICAL THERAPY LOWER EXTREMITY TREATMENT     Patient Name: Kyle Sanchez MRN: [id number] DOB:[birth year], 78 y.o., male Today's Date: 03/29/2024  END OF SESSION:  PT End of Session - 03/29/24 1348     Visit Number 5    Number of Visits 24    Date for Recertification  05/29/24    Authorization Type [employer]    Authorization - Number of Visits 24    Progress Note Due on Visit 10    PT Start Time 1345    PT Stop Time 1430    PT Time Calculation (min) 45 min    Activity Tolerance Patient limited by pain    Behavior During Therapy [facility] for tasks assessed/performed            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   Heart murmur    Hemorrhoids 06/27/2008   Hiatal hernia    1 cm hill grade 2    History of adenomatous colonic polyps 2004   History of migraines    Hyperlipidemia 09/27/2006   Hypertension    IBS (irritable bowel syndrome)    Knee pain 05/08/2023   Mild cognitive impairment with memory loss 01/23/2024   Neck pain 01/09/2023   Obstructive sleep apnea 09/27/2006   Osteoarthritis    Other fatigue 01/09/2023   Skin lesion 03/16/2023   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   Past Surgical History:  Procedure Laterality Date   CATARACT EXTRACTION, BILATERAL  2021   CHOLECYSTECTOMY     colonoscopy and endoscopy     COLONOSCOPY WITH PROPOFOL  N/A 07/26/2018   Procedure: COLONOSCOPY WITH PROPOFOL ;  Surgeon: Toledo, Ladell POUR, MD;  Location: [facility] ENDOSCOPY;  Service: Gastroenterology;  Laterality: N/A;   ESOPHAGOGASTRODUODENOSCOPY (EGD) WITH PROPOFOL  N/A 07/26/2018   Procedure: ESOPHAGOGASTRODUODENOSCOPY (EGD) WITH PROPOFOL ;  Surgeon: Toledo, Ladell POUR, MD;  Location: [facility] ENDOSCOPY;  Service: Gastroenterology;  Laterality: N/A;   Patient Active Problem List   Diagnosis  Date Noted   Acute right hip pain 01/24/2024   Mild cognitive impairment with memory loss 01/23/2024   Knee pain 05/08/2023   Neck pain 01/09/2023   Other fatigue 01/09/2023   Arthralgia 07/28/2022   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 Arlyss Solian   REFERRING PROVIDER: Dr. Arlyss Solian   REFERRING DIAG: [phone] (ICD-10-CM) - Tendinitis of right hip flexor  THERAPY DIAG:  Pain in right hip  Rationale for Evaluation and Treatment: Rehabilitation  ONSET DATE: Occurred 8 years ago  SUBJECTIVE:   SUBJECTIVE STATEMENT: Pt reports decreased right hip pain since last session and he thinks it is due to him stopping a lot of physical activity.     PERTINENT HISTORY: Pt reports having pain in the lateral side of his right hip about 8 years and he found relief from a steroid joint injection. It was feeling good up until recently, like 10 days ago. He experiences pain mostly when laying on his right side when sleeping and relief when laying on his left side.  He reports that he also has right wrist pain that occurred about 10 days ago  and it is a constant pain that is located in the anatomical snuff box and that has worsened simultaneously with his right hip pain. He remains relatively active exercising regularly and doing work around his house, and he has been limited in doing this by his right hip pain.   PAIN:  Are you having pain? Yes: NPRS scale: 1-2/10   Pain location: greater trochanter of right hip Pain description: Feels like it is locking up on him   Aggravating factors: Doing yard work   Relieving factors: Sleeping on left side or off side he is feeling pain on     PRECAUTIONS: None  RED FLAGS: None   WEIGHT BEARING RESTRICTIONS: No  FALLS:  Has patient fallen in last 6 months? No  LIVING  ENVIRONMENT: Lives with: lives with their family Lives in: House/apartment Stairs: Yes: Internal: 13 steps; on right going up and on left going up and External: 6 steps; on right going up Has following equipment at home: None and grab bars inside his bathroom    OCCUPATION: Retired   PLOF: Independent  PATIENT GOALS: He wants to know what exercises to do in order to decrease the hip pain.   NEXT MD VISIT: Did not ask    OBJECTIVE:  Note: Objective measures were completed at Evaluation unless otherwise noted.  VITALS BP 144/76 HR 70 SpO2 100%  DIAGNOSTIC FINDINGS:   CLINICAL DATA:  Right hip pain.   EXAM: DG HIP (WITH OR WITHOUT PELVIS) 2-3V RIGHT   COMPARISON:  None Available.   FINDINGS: There is no evidence of hip fracture or dislocation. Mild narrowing of right hip is noted.   IMPRESSION: Mild osteoarthritis of right hip.  No acute abnormality seen.     Electronically Signed   By: Lynwood Landy Raddle M.D.   On: 02/22/2024 12:35    PATIENT SURVEYS:  HOOS Jr   9/28  or 62%  COGNITION: Overall cognitive status: Within functional limits for tasks assessed     SENSATION: WFL  EDEMA: Not performed    MUSCLE LENGTH: Hamstrings: Right 60 deg; Left 60  deg Thomas test: Not performed    PALPATION: Right ASIS TTP    LUMBAR AROM: WNL and no pain elicited   LOWER EXTREMITY ROM:  Active ROM Right eval Left eval  Hip flexion    Hip extension    Hip abduction    Hip adduction    Hip internal rotation    Hip external rotation    Knee flexion    Knee extension    Ankle dorsiflexion    Ankle plantarflexion    Ankle inversion    Ankle eversion     (Blank rows = not tested)  LOWER EXTREMITY MMT:  MMT Right eval Left eval  Hip flexion 4-* 4+  Hip extension 4 4  Hip abduction 4- 4-  Hip adduction    Hip internal rotation 4- 4-    Hip external rotation 4 4  Knee flexion 4 4  Knee extension 4 4  Ankle dorsiflexion 4 4  Ankle plantarflexion    Ankle  inversion    Ankle eversion     (Blank rows = not tested)  LOWER EXTREMITY SPECIAL TESTS:  Hip special tests: Belvie (FABER) test: positive , Ely's test: negative, and Anterior hip impingement test: positive   FUNCTIONAL TESTS:  Squat: NT    GAIT: Distance walked: 30 ft   Assistive device utilized: None Level of assistance: Complete Independence Comments: No gait deficits noted  TREATMENT DATE:   03/29/24: SELF CARE HOME MANAGEMENT    Activity pacing using log to plan out active amount of time and rest time.    Psoas release with lacrosse ball in prone     [facility]    Eccentric Hip Flexion Marches in position of thomas test on RLE 3 x 10    Straight leg raise in supine with RLE x 2   -Pt feels increased pain in anterior of hip   Standing hip flexion with flexed knee and BUE support x 2   -Pt reports that this pain is worse on anterior hip than when his knee is extended  Standing hip flexion with extended knee and BUE support x 2  Standing right hip march 1 x 10  -Pt reports less pain in right hip than prior to surgery.      PATIENT EDUCATION:  Education details: Form and technique for correct performance of exercise and explanation about signs and symptoms of hip OA   Person educated: Patient Education method: Explanation, Demonstration, Verbal cues, and Handouts Education comprehension: verbalized understanding, returned demonstration, and verbal cues required  HOME EXERCISE PROGRAM: Access Code: [email or web address] URL: [email or web address] Date: 03/26/2024 Prepared by: Toribio Servant  Exercises - Seated Hamstring Stretch  - 1 x daily - 7 x weekly - 3 reps - 60 sec hold - Bound Angle Hands Forward   - 1 x daily - 7 x weekly - 3 reps - 60 sec  hold - Modified Thomas Stretch  - 1 x daily - 7 x weekly - 3 reps -  60 secsec hold - Supine  Hip Flexion with Resistance Loop (Mirrored)  - 3-4 x weekly - 3 sets - 10 reps - Clamshell with Resistance  - 3-4 x weekly - 3 sets - 10 reps  ASSESSMENT:  CLINICAL IMPRESSION: Pt shows ongoing signs and symptoms of right psoas tendinitis with pain elicited with hip flexion active ROM with flexed knee. He did experience relief with psoas relief with test retest with standing hip march on RLE resulting in less pain after performing psoas release. He will continue to benefit from skilled PT to improve right hip mobility and strength and to decrease right hip pain to return to walking, standing and bending without feeling excessive pain and discomfort.      OBJECTIVE IMPAIRMENTS: Abnormal gait, difficulty walking, decreased ROM, decreased strength, hypomobility, impaired flexibility, and pain.   ACTIVITY LIMITATIONS: carrying, lifting, bending, standing, squatting, and sleeping  PARTICIPATION LIMITATIONS: yard work  PERSONAL FACTORS: Age, Fitness, Time since onset of injury/illness/exacerbation, and 1-2 comorbidities: Asthma and Mild Cognitive Impairment are also affecting patient's functional outcome.   REHAB POTENTIAL: Fair chronicity of condition   CLINICAL DECISION MAKING: Stable/uncomplicated  EVALUATION COMPLEXITY: Low   GOALS: Goals reviewed with patient? No  SHORT TERM GOALS: Target date: 03/20/2024  Patient will demonstrate undestanding of home exercise plan by performing exercises correctly with evidence of good carry over with min to no verbal or tactile cues .   Baseline: NT 03/13/24: Performing independently   Goal status: ACHIEVED      LONG TERM GOALS: Target date: 05/29/2024   Patient will show an improvement in her right hip function as evidenced by an improvement in her HOOS Jr score  >=18 pts.  Baseline: 62% OR 9/28    Goal status: ONGOING    2.  Patient will improve right hip strength by  >=1/3 grade on MMT scale (ie 4- to 4) for improved right hip stability  and  function to return to bending and stooping to maintain his yard without being limited by pain and discomfort.  Baseline: Hip Flex R/L 4-*/4, Hip Ext R/L4/4, Hip Abd R/L 4-/4-  Goal status: ONGOING     3.  Patient will decreased right hip pain to <=3/10 when working in his yard as evidence of improved right hip function and tissue healing.  Baseline: 4-6/10 NRPS in ASIS of right hip   Goal status: ONGOING     4.  Patient will show improved muscle length with hamstrings for improved pelvic position by being able to perform passive straight leg raise >=80 deg on BLE.  Baseline: SLR R/L 60/60 Goal status: ONGOING      PLAN:  PT FREQUENCY: 1-2x/week  PT DURATION: 12 weeks  PLANNED INTERVENTIONS: 97164- PT Re-evaluation, 97750- Physical Performance Testing, 97110-Therapeutic exercises, 97530- Therapeutic activity, [id number]- Neuromuscular re-education, 97535- Self Care, [id number]- Manual therapy, [id number]- Gait training, [phone]- Canalith repositioning, H9716- Electrical stimulation (unattended), [phone]- Electrical stimulation (manual), [id number]- Traction (mechanical), 20560 (1-2 muscles), 20561 (3+ muscles)- Dry Needling, Patient/Family education, Balance training, Stair training, Taping, Joint mobilization, Joint manipulation, Spinal manipulation, Spinal mobilization, DME instructions, Cryotherapy, and Moist heat  PLAN FOR NEXT SESSION: Begin to reassess some of his long term goals to gauge progress.   Reassess activity pacing to see how patient did. Continue to focus on hip flexor eccentric exercises as well as abdominal- suitcase carry.    Toribio Servant PT, DPT  [facility] [address], [place], [address] Phone: [phone]   Fax:  [phone]

## 2024-03-29 NOTE — Telephone Encounter (Signed)
 Reached out to neurology but they were already closed for the day. Will try again tomorrow

## 2024-04-02 ENCOUNTER — Telehealth: Payer: Self-pay | Admitting: Physical Therapy

## 2024-04-02 ENCOUNTER — Ambulatory Visit: Admitting: Physical Therapy

## 2024-04-02 NOTE — Telephone Encounter (Signed)
 Per neurology the patient advised them that he is unable to due open MRI because anxiety. Its also noted by them as well

## 2024-04-02 NOTE — Telephone Encounter (Signed)
 Called pt to inquire about his absence from PT apt. Pt reports completely forgetting about his apt and he apologized. He reports struggling with his memory as of late. PT then offered pt openings for today and tomorrow for pt to reschedule and pt chose tomorrow to reschedule.

## 2024-04-02 NOTE — Telephone Encounter (Signed)
 Is he willing to try the MRI with anti-anxiety med prior?  Please let me know.  Thanks.

## 2024-04-03 ENCOUNTER — Ambulatory Visit: Admitting: Physical Therapy

## 2024-04-03 ENCOUNTER — Telehealth: Payer: Self-pay | Admitting: Family Medicine

## 2024-04-03 DIAGNOSIS — M25551 Pain in right hip: Secondary | ICD-10-CM

## 2024-04-03 MED ORDER — DIAZEPAM 2 MG PO TABS: 2.0000 mg | ORAL_TABLET | Freq: Once | ORAL | 0 refills | Status: AC

## 2024-04-03 NOTE — Therapy (Signed)
 OUTPATIENT PHYSICAL THERAPY LOWER EXTREMITY TREATMENT     Patient Name: Kyle Sanchez MRN: [id number] DOB:[birth year], 78 y.o., male Today's Date: 04/03/2024  END OF SESSION:  PT End of Session - 04/03/24 1309     Visit Number 6    Number of Visits 24    Date for Recertification  05/29/24    Authorization Type [employer]    Authorization - Visit Number 6    Authorization - Number of Visits 24    Progress Note Due on Visit 10    PT Start Time 1300    PT Stop Time 1345    PT Time Calculation (min) 45 min    Activity Tolerance Patient limited by pain    Behavior During Therapy [facility] for tasks assessed/performed             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   Heart murmur    Hemorrhoids 06/27/2008   Hiatal hernia    1 cm hill grade 2    History of adenomatous colonic polyps 2004   History of migraines    Hyperlipidemia 09/27/2006   Hypertension    IBS (irritable bowel syndrome)    Knee pain 05/08/2023   Mild cognitive impairment with memory loss 01/23/2024   Neck pain 01/09/2023   Obstructive sleep apnea 09/27/2006   Osteoarthritis    Other fatigue 01/09/2023   Skin lesion 03/16/2023   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   Past Surgical History:  Procedure Laterality Date   CATARACT EXTRACTION, BILATERAL  2021   CHOLECYSTECTOMY     colonoscopy and endoscopy     COLONOSCOPY WITH PROPOFOL  N/A 07/26/2018   Procedure: COLONOSCOPY WITH PROPOFOL ;  Surgeon: Toledo, Ladell POUR, MD;  Location: [facility] ENDOSCOPY;  Service: Gastroenterology;  Laterality: N/A;   ESOPHAGOGASTRODUODENOSCOPY (EGD) WITH PROPOFOL  N/A 07/26/2018   Procedure: ESOPHAGOGASTRODUODENOSCOPY (EGD) WITH PROPOFOL ;  Surgeon: Toledo, Ladell POUR, MD;  Location: [facility] ENDOSCOPY;  Service: Gastroenterology;  Laterality: N/A;   Patient  Active Problem List   Diagnosis Date Noted   Acute right hip pain 01/24/2024   Mild cognitive impairment with memory loss 01/23/2024   Knee pain 05/08/2023   Neck pain 01/09/2023   Other fatigue 01/09/2023   Arthralgia 07/28/2022   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 Arlyss Solian   REFERRING PROVIDER: Dr. Arlyss Solian   REFERRING DIAG: [phone] (ICD-10-CM) - Tendinitis of right hip flexor  THERAPY DIAG:  Pain in right hip  Rationale for Evaluation and Treatment: Rehabilitation  ONSET DATE: Occurred 8 years ago  SUBJECTIVE:   SUBJECTIVE STATEMENT: Pt reports ongoing decrease in his right hip pain and he has now returned to walking in his yard with his wife without an increase in his pain. He continues to express concern about losing his memory     PERTINENT HISTORY: Pt reports having pain in the lateral side of his right hip about 8 years and he found relief from a steroid joint injection. It was feeling good up until recently, like 10 days ago. He experiences pain mostly when laying on his right side when sleeping and  relief when laying on his left side.  He reports that he also has right wrist pain that occurred about 10 days ago and it is a constant pain that is located in the anatomical snuff box and that has worsened simultaneously with his right hip pain. He remains relatively active exercising regularly and doing work around his house, and he has been limited in doing this by his right hip pain.   PAIN:  Are you having pain? Yes: NPRS scale: 1-2/10   Pain location: greater trochanter of right hip Pain description: Feels like it is locking up on him   Aggravating factors: Doing yard work   Relieving factors: Sleeping on left side or off side he is feeling pain on     PRECAUTIONS: None  RED  FLAGS: None   WEIGHT BEARING RESTRICTIONS: No  FALLS:  Has patient fallen in last 6 months? No  LIVING ENVIRONMENT: Lives with: lives with their family Lives in: House/apartment Stairs: Yes: Internal: 13 steps; on right going up and on left going up and External: 6 steps; on right going up Has following equipment at home: None and grab bars inside his bathroom    OCCUPATION: Retired   PLOF: Independent  PATIENT GOALS: He wants to know what exercises to do in order to decrease the hip pain.   NEXT MD VISIT: Did not ask    OBJECTIVE:  Note: Objective measures were completed at Evaluation unless otherwise noted.  VITALS BP 144/76 HR 70 SpO2 100%  DIAGNOSTIC FINDINGS:   CLINICAL DATA:  Right hip pain.   EXAM: DG HIP (WITH OR WITHOUT PELVIS) 2-3V RIGHT   COMPARISON:  None Available.   FINDINGS: There is no evidence of hip fracture or dislocation. Mild narrowing of right hip is noted.   IMPRESSION: Mild osteoarthritis of right hip.  No acute abnormality seen.     Electronically Signed   By: Lynwood Landy Raddle M.D.   On: 02/22/2024 12:35    PATIENT SURVEYS:  HOOS Jr   9/28  or 62%  COGNITION: Overall cognitive status: Within functional limits for tasks assessed     SENSATION: WFL  EDEMA: Not performed    MUSCLE LENGTH: Hamstrings: Right 60 deg; Left 60  deg Thomas test: Not performed    PALPATION: Right ASIS TTP    LUMBAR AROM: WNL and no pain elicited   LOWER EXTREMITY ROM:  Active ROM Right eval Left eval  Hip flexion    Hip extension    Hip abduction    Hip adduction    Hip internal rotation    Hip external rotation    Knee flexion    Knee extension    Ankle dorsiflexion    Ankle plantarflexion    Ankle inversion    Ankle eversion     (Blank rows = not tested)  LOWER EXTREMITY MMT:  MMT Right eval Left eval Right  04/03/24 Left  04/03/24  Hip flexion 4-* 4+ 4 4+  Hip extension 4 4 4 4   Hip abduction 4- 4- 4 4  Hip adduction   4  4  Hip internal rotation 4- 4-   4 4  Hip external rotation 4 4 4 4   Knee flexion 4 4    Knee extension 4 4    Ankle dorsiflexion 4 4    Ankle plantarflexion      Ankle inversion      Ankle eversion       (Blank rows = not tested)  LOWER EXTREMITY SPECIAL TESTS:  Hip special tests: Belvie (FABER) test: positive , Ely's test: negative, and Anterior hip impingement test: positive   FUNCTIONAL TESTS:  Squat: NT    GAIT: Distance walked: 30 ft   Assistive device utilized: None Level of assistance: Complete Independence Comments: No gait deficits noted                                                                                                                                  TREATMENT DATE:   04/03/24: THERAC  Nu-Step seat and arms at 9 for 5 min   HOOS Jr:  3/28  (81%) Eccentric Hip Flexion on RLE in modified thomas pose 2 x 10    Eccentric Hip Flexion on RLE in modified thomas pose with #2 AW 2 x 10   Standing Hip Abduction with BUE support 2 x 10     -mod VC to increase upright posture  Standing Hip Abduction with BUE support and #2 AW 2 x 10    THEREX   Seated HS Stretch 4 x 60 sec   Hip MMT (See Above)   Hip Flex AROM R/L 75/70   03/29/24: [facility] HOME MANAGEMENT    Activity pacing using log to plan out active amount of time and rest time.    Psoas release with lacrosse ball in prone     [facility]    Eccentric Hip Flexion Marches in position of thomas test on RLE 3 x 10    Straight leg raise in supine with RLE x 2   -Pt feels increased pain in anterior of hip   Standing hip flexion with flexed knee and BUE support x 2   -Pt reports that this pain is worse on anterior hip than when his knee is extended  Standing hip flexion with extended knee and BUE support x 2  Standing right hip march 1 x 10  -Pt reports less pain in right hip than prior to surgery.      PATIENT EDUCATION:  Education details: Form and technique for correct performance of exercise and  explanation about signs and symptoms of hip OA   Person educated: Patient Education method: Explanation, Demonstration, Verbal cues, and Handouts Education comprehension: verbalized understanding, returned demonstration, and verbal cues required  HOME EXERCISE PROGRAM: Access Code: [email or web address] URL: [email or web address] Date: 04/03/2024 Prepared by: Toribio Servant  Program Notes Right leg march from edge of bed 3 sets of 10 reps and do 3-4 days per week  and use #2 lb AWLay down on ball for several minutes until you feel less pain or tension (use lacrosse ball or something like it.)  Exercises - Seated Hamstring Stretch  - 1 x daily - 7 x weekly - 3 reps - 60 sec hold - Bound Angle Hands Forward   - 1 x daily - 7 x weekly - 3 reps - 60 sec  hold - Modified Debby Dales  -  1 x daily - 7 x weekly - 3 reps -  60 secsec hold - Standing Hip Abduction with Counter Support  - 3-4 x weekly - 3 sets - 10 reps - Standing Hip Abduction with Counter Support (Mirrored)  - 3-4 x weekly - 3 sets - 10 reps  ASSESSMENT:  CLINICAL IMPRESSION: Pt has made considerable progress towards his rehab goals with improvement in hip strength and perception of right hip function as well as decrease in right hip pain with activity. He is nearing the end of his plan of care with focus on strengthening to address the remaining deficits.  He will continue to benefit from skilled PT to improve right hip mobility and strength and to decrease right hip pain to return to walking, standing and bending without feeling excessive pain and discomfort.      OBJECTIVE IMPAIRMENTS: Abnormal gait, difficulty walking, decreased ROM, decreased strength, hypomobility, impaired flexibility, and pain.   ACTIVITY LIMITATIONS: carrying, lifting, bending, standing, squatting, and sleeping  PARTICIPATION LIMITATIONS: yard work  PERSONAL FACTORS: Age, Fitness, Time since onset of injury/illness/exacerbation, and 1-2  comorbidities: Asthma and Mild Cognitive Impairment are also affecting patient's functional outcome.   REHAB POTENTIAL: Fair chronicity of condition   CLINICAL DECISION MAKING: Stable/uncomplicated  EVALUATION COMPLEXITY: Low   GOALS: Goals reviewed with patient? No  SHORT TERM GOALS: Target date: 03/20/2024  Patient will demonstrate undestanding of home exercise plan by performing exercises correctly with evidence of good carry over with min to no verbal or tactile cues .   Baseline: NT 03/13/24: Performing independently   Goal status: ACHIEVED      LONG TERM GOALS: Target date: 05/29/2024   Patient will show an improvement in her right hip function as evidenced by an improvement in her HOOS Jr score  >=18 pts.  Baseline: 62% OR 9/28  04/03/24:  81% (3/28)    Goal status: PARTIALLY MET     2.  Patient will improve right hip strength by  >=1/3 grade on MMT scale (ie 4- to 4) for improved right hip stability and function to return to bending and stooping to maintain his yard without being limited by pain and discomfort.  Baseline: Hip Flex R/L 4-*/4, Hip Ext R/L4/4, Hip Abd R/L 4-/4-  04/03/24: Hip Flex R/L 4/4, Hip Ext R/L 4/4 , Hip IR R/L 4/4   Goal status: PARTIALLY MET     3.  Patient will decreased right hip pain to <=3/10 when working in his yard as evidence of improved right hip function and tissue healing.  Baseline: 4-6/10 NRPS in ASIS of right hip  04/03/24: 3/10 NRPS in ASIS of right hip    Goal status: ACHIEVED     4.  Patient will show improved muscle length with hamstrings for improved pelvic position by being able to perform passive straight leg raise >=80 deg on BLE.  Baseline: SLR R/L 60/60 04/03/24: SLR R/L 75/70 Goal status: ONGOING      PLAN:  PT FREQUENCY: 1-2x/week  PT DURATION: 12 weeks  PLANNED INTERVENTIONS: 97164- PT Re-evaluation, 97750- Physical Performance Testing, 97110-Therapeutic exercises, 97530- Therapeutic activity, [id number]- Neuromuscular  re-education, 97535- Self Care, [id number]- Manual therapy, [id number]- Gait training, [phone]- Canalith repositioning, H9716- Electrical stimulation (unattended), [phone]- Electrical stimulation (manual), [id number]- Traction (mechanical), 20560 (1-2 muscles), 20561 (3+ muscles)- Dry Needling, Patient/Family education, Balance training, Stair training, Taping, Joint mobilization, Joint manipulation, Spinal manipulation, Spinal mobilization, DME instructions, Cryotherapy, and Moist heat  PLAN FOR NEXT SESSION: Mini-squats and/or step ups  for glute and quad strengthening.    Reassess activity pacing to see how patient did. Continue to focus on hip flexor eccentric exercises as well as abdominal- suitcase carry.    Toribio Servant PT, DPT  [facility] [address] [place], [address] Phone: [phone]   Fax:  [phone]

## 2024-04-03 NOTE — Telephone Encounter (Signed)
 Patients spouse return call who is listed on DPR. She states that he did try anti-anxiety medication. She did mention that in the past he was able to use the medication at [facility] [address] [place] and he had no issues. She believes that at this location the machine is more open. She is hoping that maybe he can get an order to go there for an open mri in addition to the medication. Please advise

## 2024-04-03 NOTE — Telephone Encounter (Signed)
 Copied from CRM [id number]. Topic: Clinical - Lab/Test Results >> Apr 03, 2024  9:47 AM Donna BRAVO wrote: Reason for CRM: patient wife Sinclair returning missed call from  Williams, Avaletta L, CMA

## 2024-04-03 NOTE — Telephone Encounter (Signed)
 I resent the rx for diazepam .  Can the order for MRI get changed to [address]?  Please check with referrals.  Thanks.

## 2024-04-03 NOTE — Telephone Encounter (Signed)
 Closing encounter. Patients wife has been addressed in another encounter

## 2024-04-03 NOTE — Telephone Encounter (Signed)
 Dr. Cleatus has an order placed for patient to have MRI. He did go but the patient could not successfully complete it. His spouse is wanting it go to [facility]. Can the order be changed to there or would a new order need to be placed?

## 2024-04-03 NOTE — Telephone Encounter (Signed)
 Left voicemail for patient to return call to office.

## 2024-04-04 ENCOUNTER — Encounter: Admitting: Physical Therapy

## 2024-04-05 ENCOUNTER — Ambulatory Visit: Admitting: Physical Therapy

## 2024-04-05 ENCOUNTER — Encounter: Payer: Self-pay | Admitting: Physical Therapy

## 2024-04-05 DIAGNOSIS — M25551 Pain in right hip: Secondary | ICD-10-CM

## 2024-04-05 NOTE — Therapy (Signed)
 OUTPATIENT PHYSICAL THERAPY LOWER EXTREMITY TREATMENT     Patient Name: Kyle Sanchez MRN: [id number] DOB:Apr 04, 1946, 78 y.o., male Today's Date: 04/05/2024  END OF SESSION:  PT End of Session - 04/05/24 1346     Visit Number 7    Number of Visits 24    Date for Recertification  05/29/24    Authorization Type [employer]    Authorization - Visit Number 7    Authorization - Number of Visits 24    Progress Note Due on Visit 10    PT Start Time 1300    PT Stop Time 1340    PT Time Calculation (min) 40 min    Activity Tolerance Patient tolerated treatment well    Behavior During Therapy [facility] for tasks assessed/performed              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   Heart murmur    Hemorrhoids 06/27/2008   Hiatal hernia    1 cm hill grade 2    History of adenomatous colonic polyps 2004   History of migraines    Hyperlipidemia 09/27/2006   Hypertension    IBS (irritable bowel syndrome)    Knee pain 05/08/2023   Mild cognitive impairment with memory loss 01/23/2024   Neck pain 01/09/2023   Obstructive sleep apnea 09/27/2006   Osteoarthritis    Other fatigue 01/09/2023   Skin lesion 03/16/2023   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   Past Surgical History:  Procedure Laterality Date   CATARACT EXTRACTION, BILATERAL  2021   CHOLECYSTECTOMY     colonoscopy and endoscopy     COLONOSCOPY WITH PROPOFOL  N/A 07/26/2018   Procedure: COLONOSCOPY WITH PROPOFOL ;  Surgeon: Toledo, Ladell POUR, MD;  Location: [facility] ENDOSCOPY;  Service: Gastroenterology;  Laterality: N/A;   ESOPHAGOGASTRODUODENOSCOPY (EGD) WITH PROPOFOL  N/A 07/26/2018   Procedure: ESOPHAGOGASTRODUODENOSCOPY (EGD) WITH PROPOFOL ;  Surgeon: Toledo, Ladell POUR, MD;  Location: [facility] ENDOSCOPY;  Service: Gastroenterology;  Laterality: N/A;    Patient Active Problem List   Diagnosis Date Noted   Acute right hip pain 01/24/2024   Mild cognitive impairment with memory loss 01/23/2024   Knee pain 05/08/2023   Neck pain 01/09/2023   Other fatigue 01/09/2023   Arthralgia 07/28/2022   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 Arlyss Solian   REFERRING PROVIDER: Dr. Arlyss Solian   REFERRING DIAG: [phone] (ICD-10-CM) - Tendinitis of right hip flexor  THERAPY DIAG:  Pain in right hip  Rationale for Evaluation and Treatment: Rehabilitation  ONSET DATE: Occurred 8 years ago  SUBJECTIVE:   SUBJECTIVE STATEMENT: Pt states that he is feeling some discomfort in his left hip but otherwise he has been doing ok. He has returned to playing golf and he did not feel left hip pain when playing golf.     PERTINENT HISTORY: Pt reports having pain in the lateral side of his right hip about 8 years and he found relief from a steroid joint injection. It was feeling good up until recently, like 10 days ago. He experiences pain mostly when laying on his right side when sleeping and  relief when laying on his left side.  He reports that he also has right wrist pain that occurred about 10 days ago and it is a constant pain that is located in the anatomical snuff box and that has worsened simultaneously with his right hip pain. He remains relatively active exercising regularly and doing work around his house, and he has been limited in doing this by his right hip pain.   PAIN:  Are you having pain? Yes: NPRS scale: 1-2/10   Pain location: greater trochanter of right hip Pain description: Feels like it is locking up on him   Aggravating factors: Doing yard work   Relieving factors: Sleeping on left side or off side he is feeling pain on     PRECAUTIONS: None  RED  FLAGS: None   WEIGHT BEARING RESTRICTIONS: No  FALLS:  Has patient fallen in last 6 months? No  LIVING ENVIRONMENT: Lives with: lives with their family Lives in: House/apartment Stairs: Yes: Internal: 13 steps; on right going up and on left going up and External: 6 steps; on right going up Has following equipment at home: None and grab bars inside his bathroom    OCCUPATION: Retired   PLOF: Independent  PATIENT GOALS: He wants to know what exercises to do in order to decrease the hip pain.   NEXT MD VISIT: Did not ask    OBJECTIVE:  Note: Objective measures were completed at Evaluation unless otherwise noted.  VITALS BP 144/76 HR 70 SpO2 100%  DIAGNOSTIC FINDINGS:   CLINICAL DATA:  Right hip pain.   EXAM: DG HIP (WITH OR WITHOUT PELVIS) 2-3V RIGHT   COMPARISON:  None Available.   FINDINGS: There is no evidence of hip fracture or dislocation. Mild narrowing of right hip is noted.   IMPRESSION: Mild osteoarthritis of right hip.  No acute abnormality seen.     Electronically Signed   By: Lynwood Landy Raddle M.D.   On: 02/22/2024 12:35    PATIENT SURVEYS:  HOOS Jr   9/28  or 62%  COGNITION: Overall cognitive status: Within functional limits for tasks assessed     SENSATION: WFL  EDEMA: Not performed    MUSCLE LENGTH: Hamstrings: Right 60 deg; Left 60  deg Thomas test: Not performed    PALPATION: Right ASIS TTP    LUMBAR AROM: WNL and no pain elicited   LOWER EXTREMITY ROM:  Active ROM Right eval Left eval  Hip flexion    Hip extension    Hip abduction    Hip adduction    Hip internal rotation    Hip external rotation    Knee flexion    Knee extension    Ankle dorsiflexion    Ankle plantarflexion    Ankle inversion    Ankle eversion     (Blank rows = not tested)  LOWER EXTREMITY MMT:  MMT Right eval Left eval Right  04/03/24 Left  04/03/24  Hip flexion 4-* 4+ 4 4+  Hip extension 4 4 4 4   Hip abduction 4- 4- 4 4  Hip adduction   4  4  Hip internal rotation 4- 4-   4 4  Hip external rotation 4 4 4 4   Knee flexion 4 4    Knee extension 4 4    Ankle dorsiflexion 4 4    Ankle plantarflexion      Ankle inversion      Ankle eversion       (Blank rows = not tested)  LOWER EXTREMITY SPECIAL TESTS:  Hip special tests: Belvie (FABER) test: positive , Ely's test: negative, and Anterior hip impingement test: positive   FUNCTIONAL TESTS:  Squat: NT    GAIT: Distance walked: 30 ft   Assistive device utilized: None Level of assistance: Complete Independence Comments: No gait deficits noted                                                                                                                                  TREATMENT DATE:   04/05/24 THERAC   Nu-Step with seat and arms at 8 for 5 min     Step Up on RLE on 6 inch step with BUE support 1  x 10   Step Up on RLE on 6 inch step with no UE support 1 x 10  Step Up on LLE on 6 inch step with no UE support 1 x 10  OMEGA Hip 4 way on RLE  - #40 lbs 3 x 10    Straight Leg Raise on RLE to height of top of knee 1 x 10   Straight Leg Raise on RLE to height below knee 1 x 10     THEREX   Seated Table Right  Hamstring Stretch with strap  3 X 60 sec    Seated Table Left Hamstring Stretch with strap 3 x 60 sec    PATIENT EDUCATION:  Education details: Form and technique for correct performance of exercise and explanation about signs and symptoms of hip OA   Person educated: Patient Education method: Explanation, Demonstration, Verbal cues, and Handouts Education comprehension: verbalized understanding, returned demonstration, and verbal cues required  HOME EXERCISE PROGRAM: Access Code: [email or web address] URL: [email or web address] Date: 04/05/2024 Prepared by: Toribio Servant  Exercises - Seated Table Hamstring Stretch  - 1 x daily - 7 x weekly - 3 reps - 60 sec hold - Seated Table Hamstring Stretch (Mirrored)  - 1 x daily - 7 x weekly - 3 reps - 60 sec  hold - Bound Angle Hands Forward   - 1 x daily - 7 x weekly - 3 reps - 60 sec  hold - Modified Thomas Stretch  - 1 x daily - 7 x weekly - 3 reps -  60 secsec hold - Standing Hip Abduction with Counter Support  - 3-4 x weekly - 3 sets - 10 reps - Standing Hip Abduction with Counter Support (Mirrored)  - 3-4 x weekly - 3 sets - 10 reps - Step Up  - 3-4 x weekly - 3 sets - 10 reps - Step Up (Mirrored)  - 3-4 x weekly - 3 sets - 10 reps - Small Range Straight Leg Raise (Mirrored)  - 3-4 x weekly - 3 sets - 10 reps  ASSESSMENT:  CLINICAL IMPRESSION: Pt progressing towards goals with decrease in anterior hip pain of left hip with exercise and increase in hip strength. He was able to perform straight leg  raise with minimal discomfort.   He will continue to benefit from skilled PT to improve right hip mobility and strength and to decrease right hip pain to return to walking, standing and bending without feeling excessive pain and discomfort.     OBJECTIVE IMPAIRMENTS: Abnormal gait, difficulty walking, decreased ROM, decreased strength, hypomobility, impaired flexibility, and pain.   ACTIVITY LIMITATIONS: carrying, lifting, bending, standing, squatting, and sleeping  PARTICIPATION LIMITATIONS: yard work  PERSONAL FACTORS: Age, Fitness, Time since onset of injury/illness/exacerbation, and 1-2 comorbidities: Asthma and Mild Cognitive Impairment are also affecting patient's functional outcome.   REHAB POTENTIAL: Fair chronicity of condition   CLINICAL DECISION MAKING: Stable/uncomplicated  EVALUATION COMPLEXITY: Low   GOALS: Goals reviewed with patient? No  SHORT TERM GOALS: Target date: 03/20/2024  Patient will demonstrate undestanding of home exercise plan by performing exercises correctly with evidence of good carry over with min to no verbal or tactile cues .   Baseline: NT 03/13/24: Performing independently   Goal status: ACHIEVED      LONG TERM GOALS: Target date:  05/29/2024   Patient will show an improvement in her right hip function as evidenced by an improvement in her HOOS Jr score  >=18 pts.  Baseline: 62% OR 9/28  04/03/24:  81% (3/28)    Goal status: PARTIALLY MET     2.  Patient will improve right hip strength by  >=1/3 grade on MMT scale (ie 4- to 4) for improved right hip stability and function to return to bending and stooping to maintain his yard without being limited by pain and discomfort.  Baseline: Hip Flex R/L 4-*/4, Hip Ext R/L4/4, Hip Abd R/L 4-/4-  04/03/24: Hip Flex R/L 4/4, Hip Ext R/L 4/4 , Hip IR R/L 4/4   Goal status: PARTIALLY MET     3.  Patient will decreased right hip pain to <=3/10 when working in his yard as evidence of improved right hip function and tissue healing.  Baseline: 4-6/10 NRPS in ASIS of right hip  04/03/24: 3/10 NRPS in ASIS of right hip    Goal status: ACHIEVED     4.  Patient will show improved muscle length with hamstrings for improved pelvic position by being able to perform passive straight leg raise >=80 deg on BLE.  Baseline: SLR R/L 60/60 04/03/24: SLR R/L 75/70 Goal status: ONGOING      PLAN:  PT FREQUENCY: 1-2x/week  PT DURATION: 12 weeks  PLANNED INTERVENTIONS: 97164- PT Re-evaluation, 97750- Physical Performance Testing, 97110-Therapeutic exercises, 97530- Therapeutic activity, [id number]- Neuromuscular re-education, 97535- Self Care, 02859- Manual therapy, [id number]- Gait training, [id number]- Canalith repositioning, H9716- Electrical stimulation (unattended), [phone]- Electrical stimulation (manual), [id number]- Traction (mechanical), 20560 (1-2 muscles), 20561 (3+ muscles)- Dry Needling, Patient/Family education, Balance training, Stair training, Taping, Joint mobilization, Joint manipulation, Spinal manipulation, Spinal mobilization, DME instructions, Cryotherapy, and Moist heat  PLAN FOR NEXT SESSION: Mini-squats for glute strengthening   Reassess activity pacing to see how patient did. Continue to focus on  hip flexor eccentric exercises as well as abdominal- suitcase carry.    Toribio Servant PT, DPT  [facility] [address] [place], [address] Phone: [phone]   Fax:  [phone]

## 2024-04-05 NOTE — Telephone Encounter (Signed)
 Spoke with patient and at this time he is going to hold off on MRI.

## 2024-04-08 NOTE — Telephone Encounter (Signed)
 Noted. Thanks.

## 2024-04-09 ENCOUNTER — Encounter: Admitting: Psychology

## 2024-04-10 ENCOUNTER — Ambulatory Visit: Admitting: Physical Therapy

## 2024-04-10 ENCOUNTER — Encounter: Payer: Self-pay | Admitting: Physical Therapy

## 2024-04-10 DIAGNOSIS — M25551 Pain in right hip: Secondary | ICD-10-CM | POA: Diagnosis not present

## 2024-04-10 NOTE — Therapy (Signed)
 OUTPATIENT PHYSICAL THERAPY LOWER EXTREMITY TREATMENT     Patient Name: Kyle Sanchez MRN: [id number] DOB:1945/11/30, 78 y.o., male Today's Date: 04/10/2024  END OF SESSION:  PT End of Session - 04/10/24 1035     Visit Number 8    Number of Visits 24    Date for Recertification  05/29/24    Authorization Type [employer]    Authorization - Visit Number 8    Authorization - Number of Visits 24    Progress Note Due on Visit 10    PT Start Time 1030    PT Stop Time 1115    PT Time Calculation (min) 45 min    Activity Tolerance Patient tolerated treatment well    Behavior During Therapy [facility] for tasks assessed/performed              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   Heart murmur    Hemorrhoids 06/27/2008   Hiatal hernia    1 cm hill grade 2    History of adenomatous colonic polyps 2004   History of migraines    Hyperlipidemia 09/27/2006   Hypertension    IBS (irritable bowel syndrome)    Knee pain 05/08/2023   Mild cognitive impairment with memory loss 01/23/2024   Neck pain 01/09/2023   Obstructive sleep apnea 09/27/2006   Osteoarthritis    Other fatigue 01/09/2023   Skin lesion 03/16/2023   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   Past Surgical History:  Procedure Laterality Date   CATARACT EXTRACTION, BILATERAL  2021   CHOLECYSTECTOMY     colonoscopy and endoscopy     COLONOSCOPY WITH PROPOFOL  N/A 07/26/2018   Procedure: COLONOSCOPY WITH PROPOFOL ;  Surgeon: Toledo, Ladell POUR, MD;  Location: [facility] ENDOSCOPY;  Service: Gastroenterology;  Laterality: N/A;   ESOPHAGOGASTRODUODENOSCOPY (EGD) WITH PROPOFOL  N/A 07/26/2018   Procedure: ESOPHAGOGASTRODUODENOSCOPY (EGD) WITH PROPOFOL ;  Surgeon: Toledo, Ladell POUR, MD;  Location: [facility] ENDOSCOPY;  Service: Gastroenterology;  Laterality: N/A;    Patient Active Problem List   Diagnosis Date Noted   Acute right hip pain 01/24/2024   Mild cognitive impairment with memory loss 01/23/2024   Knee pain 05/08/2023   Neck pain 01/09/2023   Other fatigue 01/09/2023   Arthralgia 07/28/2022   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 Arlyss Solian   REFERRING PROVIDER: Dr. Arlyss Solian   REFERRING DIAG: [phone] (ICD-10-CM) - Tendinitis of right hip flexor  THERAPY DIAG:  Pain in right hip  Rationale for Evaluation and Treatment: Rehabilitation  ONSET DATE: Occurred 8 years ago  SUBJECTIVE:   SUBJECTIVE STATEMENT: Pt states that he is feeling some discomfort in his left hip but otherwise he has been doing ok. He has returned to playing golf and he did not feel left hip pain when playing golf.     PERTINENT HISTORY: Pt reports having pain in the lateral side of his right hip about 8 years and he found relief from a steroid joint injection. It was feeling good up until recently, like 10 days ago. He experiences pain mostly when laying on his right side when sleeping and  relief when laying on his left side.  He reports that he also has right wrist pain that occurred about 10 days ago and it is a constant pain that is located in the anatomical snuff box and that has worsened simultaneously with his right hip pain. He remains relatively active exercising regularly and doing work around his house, and he has been limited in doing this by his right hip pain.   PAIN:  Are you having pain? Yes: NPRS scale: 1-2/10   Pain location: greater trochanter of right hip Pain description: Feels like it is locking up on him   Aggravating factors: Doing yard work   Relieving factors: Sleeping on left side or off side he is feeling pain on     PRECAUTIONS: None  RED  FLAGS: None   WEIGHT BEARING RESTRICTIONS: No  FALLS:  Has patient fallen in last 6 months? No  LIVING ENVIRONMENT: Lives with: lives with their family Lives in: House/apartment Stairs: Yes: Internal: 13 steps; on right going up and on left going up and External: 6 steps; on right going up Has following equipment at home: None and grab bars inside his bathroom    OCCUPATION: Retired   PLOF: Independent  PATIENT GOALS: He wants to know what exercises to do in order to decrease the hip pain.   NEXT MD VISIT: Did not ask    OBJECTIVE:  Note: Objective measures were completed at Evaluation unless otherwise noted.  VITALS BP 144/76 HR 70 SpO2 100%  DIAGNOSTIC FINDINGS:   CLINICAL DATA:  Right hip pain.   EXAM: DG HIP (WITH OR WITHOUT PELVIS) 2-3V RIGHT   COMPARISON:  None Available.   FINDINGS: There is no evidence of hip fracture or dislocation. Mild narrowing of right hip is noted.   IMPRESSION: Mild osteoarthritis of right hip.  No acute abnormality seen.     Electronically Signed   By: Lynwood Landy Raddle M.D.   On: 02/22/2024 12:35    PATIENT SURVEYS:  HOOS Jr   9/28  or 62%  COGNITION: Overall cognitive status: Within functional limits for tasks assessed     SENSATION: WFL  EDEMA: Not performed    MUSCLE LENGTH: Hamstrings: Right 60 deg; Left 60  deg Thomas test: Not performed    PALPATION: Right ASIS TTP    LUMBAR AROM: WNL and no pain elicited   LOWER EXTREMITY ROM:  Active ROM Right eval Left eval  Hip flexion    Hip extension    Hip abduction    Hip adduction    Hip internal rotation    Hip external rotation    Knee flexion    Knee extension    Ankle dorsiflexion    Ankle plantarflexion    Ankle inversion    Ankle eversion     (Blank rows = not tested)  LOWER EXTREMITY MMT:  MMT Right eval Left eval Right  04/03/24 Left  04/03/24  Hip flexion 4-* 4+ 4 4+  Hip extension 4 4 4 4   Hip abduction 4- 4- 4 4  Hip adduction   4  4  Hip internal rotation 4- 4-   4 4  Hip external rotation 4 4 4 4   Knee flexion 4 4    Knee extension 4 4    Ankle dorsiflexion 4 4    Ankle plantarflexion      Ankle inversion      Ankle eversion       (Blank rows = not tested)  LOWER EXTREMITY SPECIAL TESTS:  Hip special tests: Belvie (FABER) test: positive , Ely's test: negative, and Anterior hip impingement test: positive   FUNCTIONAL TESTS:  Squat: NT    GAIT: Distance walked: 30 ft   Assistive device utilized: None Level of assistance: Complete Independence Comments: No gait deficits noted                                                                                                                                  TREATMENT DATE:   04/10/24: MANUAL   SLR R/L 75/75   Supine HS Stretch 6 x 60 sec   THERAC   Nu-Step with seat at 7 for 5 minutes    Squat with quad focus 3 x 10       -min VC to keep knees behind toes and to tap mat surface of 23 inches   Modified Straight Leg Raise on RLE 2 x 10    -min VC to raise leg up to the height of opposite knee  -min VC to decrease speed of eccentric phase of therapy Side Lying Hip ADDuction 3 x 10  -min VC for setup of exercise and to maintain extended knee      PATIENT EDUCATION:  Education details: Form and technique for correct performance of exercise and explanation about signs and symptoms of hip OA   Person educated: Patient Education method: Explanation, Demonstration, Verbal cues, and Handouts Education comprehension: verbalized understanding, returned demonstration, and verbal cues required  HOME EXERCISE PROGRAM: Access Code: [email or web address] URL: [email or web address] Date: 04/10/2024 Prepared by: Toribio Servant  Exercises - Seated Table Hamstring Stretch  - 1 x daily - 7 x weekly - 3 reps - 60 sec hold - Seated Table Hamstring Stretch (Mirrored)  - 1 x daily - 7 x weekly - 3 reps - 60 sec hold - Bound Angle Hands Forward   - 1 x daily - 7 x  weekly - 3 reps - 60 sec  hold - Modified Thomas Stretch  - 1 x daily - 7 x weekly - 3 reps -  60 secsec hold - Standing Hip Abduction with Counter Support  - 3-4 x weekly - 3 sets - 10 reps - Standing Hip Abduction with Counter Support (Mirrored)  - 3-4 x weekly - 3 sets - 10 reps - Squat with Chair Touch  - 3-4 x weekly - 3 sets - 10 reps - Sidelying Hip Adduction  - 3-4 x weekly - 3 sets - 10 reps - Sidelying Hip Adduction (Mirrored)  - 3-4 x weekly - 3 sets - 10 reps - Active Straight Leg Raise with Quad Set  - 3-4 x weekly - 3 sets - 10 reps - Active Straight Leg Raise with Quad Set (Mirrored)  - 3 x weekly - 3 sets - 10 reps  ASSESSMENT:  CLINICAL IMPRESSION: Pt demonstrates improvement in right hip flexor strength with ability to perform straight leg raise  to increased height and without feeling increased pain in right hip flexor. He is nearing the end of his plan of care and if he feels less pain next session, then he will likely be discharged. He will continue to benefit from skilled PT to improve right hip mobility and strength and to decrease right hip pain to return to walking, standing and bending without feeling excessive pain and discomfort.     OBJECTIVE IMPAIRMENTS: Abnormal gait, difficulty walking, decreased ROM, decreased strength, hypomobility, impaired flexibility, and pain.   ACTIVITY LIMITATIONS: carrying, lifting, bending, standing, squatting, and sleeping  PARTICIPATION LIMITATIONS: yard work  PERSONAL FACTORS: Age, Fitness, Time since onset of injury/illness/exacerbation, and 1-2 comorbidities: Asthma and Mild Cognitive Impairment are also affecting patient's functional outcome.   REHAB POTENTIAL: Fair chronicity of condition   CLINICAL DECISION MAKING: Stable/uncomplicated  EVALUATION COMPLEXITY: Low   GOALS: Goals reviewed with patient? No  SHORT TERM GOALS: Target date: 03/20/2024  Patient will demonstrate undestanding of home exercise plan by  performing exercises correctly with evidence of good carry over with min to no verbal or tactile cues .   Baseline: NT 03/13/24: Performing independently   Goal status: ACHIEVED      LONG TERM GOALS: Target date: 05/29/2024   Patient will show an improvement in her right hip function as evidenced by an improvement in her HOOS Jr score  >=18 pts.  Baseline: 62% OR 9/28  04/03/24:  81% (3/28)    Goal status: ACHIEVED     2.  Patient will improve right hip strength by  >=1/3 grade on MMT scale (ie 4- to 4) for improved right hip stability and function to return to bending and stooping to maintain his yard without being limited by pain and discomfort.  Baseline: Hip Flex R/L 4-*/4, Hip Ext R/L4/4, Hip Abd R/L 4-/4-  04/03/24: Hip Flex R/L 4/4, Hip Ext R/L 4/4 , Hip IR R/L 4/4   Goal status: PARTIALLY MET     3.  Patient will decreased right hip pain to <=3/10 when working in his yard as evidence of improved right hip function and tissue healing.  Baseline: 4-6/10 NRPS in ASIS of right hip  04/03/24: 3/10 NRPS in ASIS of right hip   04/10/24: 0/10 NRPS in right hip   Goal status: ACHIEVED     4.  Patient will show improved muscle length with hamstrings for improved pelvic position by being able to perform passive straight leg raise >=80 deg on BLE.  Baseline: SLR R/L 60/60 04/03/24: SLR R/L 75/70  04/10/24:  SLR R/L 75/75 Goal status: ONGOING      PLAN:  PT FREQUENCY: 1-2x/week  PT DURATION: 12 weeks  PLANNED INTERVENTIONS: 97164- PT Re-evaluation, 97750- Physical Performance Testing, 97110-Therapeutic exercises, 97530- Therapeutic activity, 97112- Neuromuscular re-education, 97535- Self Care, 02859- Manual therapy, [id number]- Gait training, [phone]- Canalith repositioning, H9716- Electrical stimulation (unattended), [phone]- Electrical stimulation (manual), [id number]- Traction (mechanical), 20560 (1-2 muscles), 20561 (3+ muscles)- Dry Needling, Patient/Family education, Balance training, Stair training,  Taping, Joint mobilization, Joint manipulation, Spinal manipulation, Spinal mobilization, DME instructions, Cryotherapy, and Moist heat  PLAN FOR NEXT SESSION: Reassess activity pacing to see how patient did and remaining long term goals Hip MMT. Finalize home exercise plan and maybe consider single leg stance     Toribio Servant PT, DPT  [facility] [address], [place], [address] Phone: [phone]   Fax:  [phone]

## 2024-04-11 ENCOUNTER — Encounter: Payer: Medicare HMO | Admitting: Nurse Practitioner

## 2024-04-12 ENCOUNTER — Ambulatory Visit: Admitting: Physical Therapy

## 2024-04-12 ENCOUNTER — Ambulatory Visit: Payer: Medicare HMO

## 2024-04-12 VITALS — BP 128/76 | Ht 64.0 in | Wt 162.0 lb

## 2024-04-12 DIAGNOSIS — Z23 Encounter for immunization: Secondary | ICD-10-CM

## 2024-04-12 DIAGNOSIS — Z0000 Encounter for general adult medical examination without abnormal findings: Secondary | ICD-10-CM | POA: Diagnosis not present

## 2024-04-12 NOTE — Patient Instructions (Signed)
 Kyle Sanchez,  Thank you for taking the time for your Medicare Wellness Visit. I appreciate your continued commitment to your health goals. Please review the care plan we discussed, and feel free to reach out if I can assist you further.  Medicare recommends these wellness visits once per year to help you and your care team stay ahead of potential health issues. These visits are designed to focus on prevention, allowing your provider to concentrate on managing your acute and chronic conditions during your regular appointments.  Please note that Annual Wellness Visits do not include a physical exam. Some assessments may be limited, especially if the visit was conducted virtually. If needed, we may recommend a separate in-person follow-up with your provider.  Ongoing Care Seeing your primary care provider every 3 to 6 months helps us  monitor your health and provide consistent, personalized care.   Referrals If a referral was made during today's visit and you haven't received any updates within two weeks, please contact the referred provider directly to check on the status.  Recommended Screenings:  Health Maintenance  Topic Date Due   Flu Shot  10/09/2024*   Medicare Annual Wellness Visit  04/12/2025   Colon Cancer Screening  10/22/2026   Pneumococcal Vaccine for age over 58  Completed   Hepatitis C Screening  Completed   Zoster (Shingles) Vaccine  Completed   HPV Vaccine  Aged Out   Meningitis B Vaccine  Aged Out   DTaP/Tdap/Td vaccine  Discontinued   COVID-19 Vaccine  Discontinued  *Topic was postponed. The date shown is not the original due date.       01/19/2024    1:14 PM  Advanced Directives  Does Patient Have a Medical Advance Directive? Yes  Type of Advance Directive Healthcare Power of Attorney  Does patient want to make changes to medical advance directive? No - Patient declined  Copy of Healthcare Power of Attorney in Chart? No - copy requested   Advance Care Planning is  important because it: Ensures you receive medical care that aligns with your values, goals, and preferences. Provides guidance to your family and loved ones, reducing the emotional burden of decision-making during critical moments.  Vision: Annual vision screenings are recommended for early detection of glaucoma, cataracts, and diabetic retinopathy. These exams can also reveal signs of chronic conditions such as diabetes and high blood pressure.  Dental: Annual dental screenings help detect early signs of oral cancer, gum disease, and other conditions linked to overall health, including heart disease and diabetes.  Please see the attached documents for additional preventive care recommendations.

## 2024-04-12 NOTE — Progress Notes (Signed)
 Please attest and cosign this visit due to patients primary care provider not being in the office at the time the visit was completed.    Subjective:   Kyle Sanchez is a 78 y.o. who presents for a Medicare Wellness preventive visit.  As a reminder, Annual Wellness Visits don't include a physical exam, and some assessments may be limited, especially if this visit is performed virtually. We may recommend an in-person follow-up visit with your provider if needed.  Visit Complete: In person  Persons Participating in Visit: patient  AWV Questionnaire: No: Patient Medicare AWV questionnaire was not completed prior to this visit.    Objective:    Today's Vitals   04/12/24 1306 04/12/24 1307  BP: 128/76   Weight: 162 lb (73.5 kg)   Height: 5' 4 (1.626 m)   PainSc:  5    Body mass index is 27.81 kg/m.     04/12/2024    1:18 PM 01/19/2024    1:14 PM 03/30/2023   11:34 AM 05/10/2022    8:20 AM 10/22/2019    1:57 PM 07/26/2018    8:39 AM  Advanced Directives  Does Patient Have a Medical Advance Directive? Yes Yes Yes Yes Yes Yes   Type of Estate agent of [place];Living will Healthcare Power of [employer] Power of [employer] Power of [place];Living will Healthcare Power of [place];Living will Healthcare Power of [place];Living will  Does patient want to make changes to medical advance directive?  No - Patient declined  No - Patient declined No - Patient declined   Copy of Healthcare Power of Attorney in Chart? No - copy requested No - copy requested Yes - validated most recent copy scanned in chart (See row information) No - copy requested No - copy requested No - copy requested      Data saved with a previous flowsheet row definition    Current Medications (verified) Outpatient Encounter Medications as of 04/12/2024  Medication Sig   aspirin EC 81 MG tablet Take 81 mg by mouth daily.   calcium citrate (CALCITRATE - DOSED IN MG ELEMENTAL  CALCIUM) 950 (200 Ca) MG tablet Take 200 mg of elemental calcium by mouth daily. With zinc and magnesium.   Cholecalciferol  25 MCG (1000 UT) capsule Take 4 capsules (4,000 Units total) by mouth daily.   clobetasol  cream (TEMOVATE ) 0.05 % Apply 1 application topically 2 (two) times daily as needed.   Ginkgo Biloba 120 MG TABS Take by mouth daily.   glucosamine-chondroitin 500-400 MG tablet Take 1 tablet by mouth daily.   lactobacillus acidophilus (BACID) TABS tablet Take 1 tablet by mouth daily.   meloxicam  (MOBIC ) 7.5 MG tablet Take 1 tablet (7.5 mg total) by mouth 2 (two) times daily as needed for pain (hold while taking prednisone .).   Multiple Vitamin (MULTIVITAMIN) capsule Take 1 capsule by mouth daily.   Omega-3 Fatty Acids (RA FISH OIL) 1400 MG CPDR Take by mouth daily.   polyethylene glycol (MIRALAX / GLYCOLAX) 17 g packet Take 17 g by mouth daily.   Red Yeast Rice Extract (RED YEAST RICE PO) Take 1 tablet by mouth daily.   Turmeric 500 MG CAPS Take 500 mg by mouth in the morning and at bedtime.   predniSONE  (DELTASONE ) 20 MG tablet Take 2 a day for 5 days, then 1 a day for 5 days, with food. Don't take with aleve/ibuprofen/meloxicam . (Patient not taking: Reported on 04/12/2024)   No facility-administered encounter medications on file as of 04/12/2024.  Allergies (verified) Nortriptyline , Simvastatin , and Sucralfate    History: 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   Heart murmur    Hemorrhoids 06/27/2008   Hiatal hernia    1 cm hill grade 2    History of adenomatous colonic polyps 2004   History of migraines    Hyperlipidemia 09/27/2006   Hypertension    IBS (irritable bowel syndrome)    Knee pain 05/08/2023   Mild cognitive impairment with memory loss 01/23/2024   Neck pain 01/09/2023    Obstructive sleep apnea 09/27/2006   Osteoarthritis    Other fatigue 01/09/2023   Skin lesion 03/16/2023   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   Past Surgical History:  Procedure Laterality Date   CATARACT EXTRACTION, BILATERAL  2021   CHOLECYSTECTOMY     colonoscopy and endoscopy     COLONOSCOPY WITH PROPOFOL  N/A 07/26/2018   Procedure: COLONOSCOPY WITH PROPOFOL ;  Surgeon: Toledo, Ladell POUR, MD;  Location: [facility] ENDOSCOPY;  Service: Gastroenterology;  Laterality: N/A;   ESOPHAGOGASTRODUODENOSCOPY (EGD) WITH PROPOFOL  N/A 07/26/2018   Procedure: ESOPHAGOGASTRODUODENOSCOPY (EGD) WITH PROPOFOL ;  Surgeon: Toledo, Ladell POUR, MD;  Location: [facility] ENDOSCOPY;  Service: Gastroenterology;  Laterality: N/A;   Family History  Problem Relation Age of Onset   Nephrolithiasis Mother    Arthritis Mother    Stroke Mother    Alcohol abuse Father    Mental illness Sister    Dementia Brother    Prostate cancer Brother    Colon polyps Brother    Colon cancer Neg Hx    Esophageal cancer Neg Hx    Social History   Socioeconomic History   Marital status: Married    Spouse name: Not on file   Number of children: 2   Years of education: 10   Highest education level: 10th grade  Occupational History   Occupation: Retired    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  Tobacco Use   Smoking status: Former   Smokeless tobacco: Never   Tobacco comments:    Stopped 30 years ago   Advertising account planner   Vaping status: Never Used  Substance and Sexual Activity   Alcohol use: Never    Alcohol/week: 0.0 standard drinks of alcohol   Drug use: No   Sexual activity: Yes    Partners: Female  Other Topics Concern   Not on file  Social History Narrative   From [place] since 1995   Married 1966   Speaks English and Spanish   Mets and Yankees fan   No falls   Lives with wife   Two falls   Social Drivers of Corporate investment banker Strain: Low Risk  (04/12/2024)   Overall Financial Resource Strain  (CARDIA)    Difficulty of Paying Living Expenses: Not hard at all  Food Insecurity: No Food Insecurity (04/12/2024)   Hunger Vital Sign    Worried About Running Out of Food in the Last Year: Never true    Ran Out of Food in the Last Year: Never true  Transportation Needs: No Transportation Needs (04/12/2024)   PRAPARE - Administrator, Civil Service (Medical): No    Lack of Transportation (Non-Medical): No  Physical Activity: Insufficiently Active (04/12/2024)   Exercise Vital Sign  Days of Exercise per Week: 4 days    Minutes of Exercise per Session: 30 min  Stress: No Stress Concern Present (04/12/2024)   [employer] of Occupational Health - Occupational Stress Questionnaire    Feeling of Stress: Only a little  Social Connections: Moderately Integrated (04/12/2024)   Social Connection and Isolation Panel    Frequency of Communication with Friends and Family: Three times a week    Frequency of Social Gatherings with Friends and Family: Once a week    Attends Religious Services: More than 4 times per year    Active Member of [employer] or Organizations: No    Attends Engineer, structural: Never    Marital Status: Married    Tobacco Counseling Counseling given: Not Answered Tobacco comments: Stopped 30 years ago    Clinical Intake:  Pre-visit preparation completed: Yes  Pain : 0-10 Pain Score: 5  Pain Type: Chronic pain Pain Location: Hand Pain Orientation: Right Pain Descriptors / Indicators: Aching, Radiating Pain Onset: More than a month ago Pain Frequency: Constant Pain Relieving Factors: brace for arm Effect of Pain on Daily Activities: limited use of hand sometimes  Pain Relieving Factors: brace for arm  BMI - recorded: 27.81 Nutritional Status: BMI 25 -29 Overweight Nutritional Risks: None Diabetes: No  No results found for: HGBA1C   How often do you need to have someone help you when you read instructions, pamphlets, or other written  materials from your doctor or pharmacy?: 1 - Never  Interpreter Needed?: No  Comments: lives with wife Information entered by :: B.Tiann Saha,LPN   Activities of Daily Living     04/12/2024    1:19 PM  In your present state of health, do you have any difficulty performing the following activities:  Hearing? 0  Vision? 0  Difficulty concentrating or making decisions? 0  Walking or climbing stairs? 0  Dressing or bathing? 0  Doing errands, shopping? 0  Preparing Food and eating ? N  Using the Toilet? N  In the past six months, have you accidently leaked urine? N  Do you have problems with loss of bowel control? N  Managing your Medications? N  Managing your Finances? N  Housekeeping or managing your Housekeeping? N    Patient Care Team: Cleatus Arlyss RAMAN, MD as PCP - General (Family Medicine) [place] Sand[place] GAILS, DO as Consulting Physician (Gastroenterology) Wertman, Sara E, PA-C (Neurology) Roney, Jenna, OD as Referring Physician (Optometry)  I have updated your Care Teams any recent Medical Services you may have received from other providers in the past year.     Assessment:   This is a routine wellness examination for Kyle Sanchez.  Hearing/Vision screen Hearing Screening - Comments:: Patient denies any hearing difficulties.   Vision Screening - Comments:: Pt says their vision is good with glasses Dr  Eleanora   Goals Addressed             This Visit's Progress    Patient Stated   On track    04/12/24-Maintain current weight       Depression Screen     04/12/2024    1:16 PM 03/27/2024    3:56 PM 03/27/2024    9:05 AM 03/01/2024    3:25 PM 02/22/2024   11:00 AM 10/20/2023   11:01 AM 05/19/2023    3:44 PM  PHQ 2/9 Scores  PHQ - 2 Score 0 0 0 0 0 0 0  PHQ- 9 Score  0 0 2 0 0 2  Fall Risk     04/12/2024    1:10 PM 03/27/2024    3:55 PM 03/27/2024    9:05 AM 03/01/2024    3:25 PM 02/22/2024   11:00 AM  Fall Risk   Falls in the past year? 0 0 0 0 0  Number  falls in past yr: 0 0 0 0 0  Injury with Fall? 0 0 0 0 0  Risk for fall due to : No Fall Risks No Fall Risks No Fall Risks No Fall Risks No Fall Risks  Follow up Falls prevention discussed;Education provided Falls evaluation completed Falls evaluation completed Falls evaluation completed Falls evaluation completed    MEDICARE RISK AT HOME:  Medicare Risk at Home Any stairs in or around the home?: Yes If so, are there any without handrails?: Yes Home free of loose throw rugs in walkways, pet beds, electrical cords, etc?: Yes Adequate lighting in your home to reduce risk of falls?: Yes Life alert?: No Use of a cane, walker or w/c?: No Grab bars in the bathroom?: Yes Shower chair or bench in shower?: Yes Elevated toilet seat or a handicapped toilet?: No  TIMED UP AND GO:  Was the test performed?  Yes  Length of time to ambulate 10 feet: 12 sec Gait steady and fast without use of assistive device  Cognitive Function: 6CIT completed      01/19/2024    2:00 PM  Montreal Cognitive Assessment   Visuospatial/ Executive (0/5) 1  Naming (0/3) 3  Attention: Read list of digits (0/2) 2  Attention: Read list of letters (0/1) 1  Attention: Serial 7 subtraction starting at 100 (0/3) 1  Language: Repeat phrase (0/2) 0  Language : Fluency (0/1) 1  Abstraction (0/2) 0  Delayed Recall (0/5) 1  Orientation (0/6) 5  Total 15  Adjusted Score (based on education) 16      04/12/2024    1:19 PM 03/30/2023   11:33 AM 05/10/2022    8:31 AM  6CIT Screen  What Year? 0 points 0 points 0 points  What month? 0 points 0 points 0 points  What time? 3 points 0 points 0 points  Count back from 20 0 points 0 points 0 points  Months in reverse 2 points 0 points 0 points  Repeat phrase 8 points 0 points 0 points  Total Score 13 points 0 points 0 points    Immunizations Immunization History  Administered Date(s) Administered   Fluad Quad(high Dose 65+) 03/21/2019, 06/11/2020, 05/07/2021, 05/10/2022    Fluad Trivalent(High Dose 65+) 05/19/2023   INFLUENZA, HIGH DOSE SEASONAL PF 04/12/2024   Influenza Split 04/08/2011, 05/01/2012   Influenza Whole 03/29/2008, 04/16/2009, 04/22/2010   Influenza,inj,Quad PF,6+ Mos 05/23/2013, 03/26/2014, 03/28/2015, 04/02/2016, 04/12/2017, 04/21/2018   Influenza-Unspecified 05/23/2013, 03/26/2014, 03/28/2015, 04/02/2016, 04/12/2017, 04/21/2018   PFIZER(Purple Top)SARS-COV-2 Vaccination 09/01/2019, 09/26/2019, 05/20/2020   Pneumococcal Conjugate-13 03/28/2015   Pneumococcal Polysaccharide-23 06/11/2005, 01/30/2010   Td 07/12/1989, 06/11/2005   Tdap 06/11/2005   Zoster Recombinant(Shingrix) 11/23/2019, 01/31/2020    Screening Tests Health Maintenance  Topic Date Due   Medicare Annual Wellness (AWV)  04/12/2025   Colonoscopy  10/22/2026   Pneumococcal Vaccine: 50+ Years  Completed   Influenza Vaccine  Completed   Hepatitis C Screening  Completed   Zoster Vaccines- Shingrix  Completed   HPV VACCINES  Aged Out   Meningococcal B Vaccine  Aged Out   DTaP/Tdap/Td  Discontinued   COVID-19 Vaccine  Discontinued    Health Maintenance Items Addressed: Vaccines Given  today: Influenza  Additional Screening:  Vision Screening: Recommended annual ophthalmology exams for early detection of glaucoma and other disorders of the eye. Is the patient up to date with their annual eye exam?  Yes  Who is the provider or what is the name of the office in which the patient attends annual eye exams? Dr Royetta  Dental Screening: Recommended annual dental exams for proper oral hygiene  Community Resource Referral / Chronic Care Management: CRR required this visit?  No   CCM required this visit?  No   Plan:    I have personally reviewed and noted the following in the patient's chart:   Medical and social history Use of alcohol, tobacco or illicit drugs  Current medications and supplements including opioid prescriptions. Patient is not currently taking opioid  prescriptions. Functional ability and status Nutritional status Physical activity Advanced directives List of other physicians Hospitalizations, surgeries, and ER visits in previous 12 months Vitals Screenings to include cognitive, depression, and falls Referrals and appointments  In addition, I have reviewed and discussed with patient certain preventive protocols, quality metrics, and best practice recommendations. A written personalized care plan for preventive services as well as general preventive health recommendations were provided to patient.   Kyle LITTIE Saris, LPN   [id number]   After Visit Summary: In person visit but pt declined: will view in mychart  Notes: Nothing significant to report at this time.

## 2024-04-17 ENCOUNTER — Ambulatory Visit: Attending: Family Medicine | Admitting: Physical Therapy

## 2024-04-17 DIAGNOSIS — M25551 Pain in right hip: Secondary | ICD-10-CM | POA: Diagnosis not present

## 2024-04-17 NOTE — Therapy (Signed)
 OUTPATIENT PHYSICAL THERAPY LOWER EXTREMITY DISCHARGE     Patient Name: Kyle Sanchez MRN: [id number] DOB:[birth year], 78 y.o., male Today's Date: 04/17/2024  END OF SESSION:  PT End of Session - 04/17/24 1648     Visit Number 9    Number of Visits 24    Date for Recertification  05/29/24    Authorization Type [employer]    Authorization - Visit Number 9    Authorization - Number of Visits 24    Progress Note Due on Visit 10    PT Start Time 1645    PT Stop Time 1730    PT Time Calculation (min) 45 min    Activity Tolerance Patient tolerated treatment well    Behavior During Therapy [facility] for tasks assessed/performed              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   Heart murmur    Hemorrhoids 06/27/2008   Hiatal hernia    1 cm hill grade 2    History of adenomatous colonic polyps 2004   History of migraines    Hyperlipidemia 09/27/2006   Hypertension    IBS (irritable bowel syndrome)    Knee pain 05/08/2023   Mild cognitive impairment with memory loss 01/23/2024   Neck pain 01/09/2023   Obstructive sleep apnea 09/27/2006   Osteoarthritis    Other fatigue 01/09/2023   Skin lesion 03/16/2023   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   Past Surgical History:  Procedure Laterality Date   CATARACT EXTRACTION, BILATERAL  2021   CHOLECYSTECTOMY     colonoscopy and endoscopy     COLONOSCOPY WITH PROPOFOL  N/A 07/26/2018   Procedure: COLONOSCOPY WITH PROPOFOL ;  Surgeon: Toledo, Ladell POUR, MD;  Location: [facility] ENDOSCOPY;  Service: Gastroenterology;  Laterality: N/A;   ESOPHAGOGASTRODUODENOSCOPY (EGD) WITH PROPOFOL  N/A 07/26/2018   Procedure: ESOPHAGOGASTRODUODENOSCOPY (EGD) WITH PROPOFOL ;  Surgeon: Toledo, Ladell POUR, MD;  Location: [facility] ENDOSCOPY;  Service: Gastroenterology;  Laterality: N/A;    Patient Active Problem List   Diagnosis Date Noted   Acute right hip pain 01/24/2024   Mild cognitive impairment with memory loss 01/23/2024   Knee pain 05/08/2023   Neck pain 01/09/2023   Other fatigue 01/09/2023   Arthralgia 07/28/2022   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 Arlyss Solian   REFERRING PROVIDER: Dr. Arlyss Solian   REFERRING DIAG: [phone] (ICD-10-CM) - Tendinitis of right hip flexor  THERAPY DIAG:  Pain in right hip  Rationale for Evaluation and Treatment: Rehabilitation  ONSET DATE: Occurred 8 years ago  SUBJECTIVE:   SUBJECTIVE STATEMENT: Pt states that he has going low hip pain. He is now regularly engaging in his normal recreational activities like walking and gardening.    PERTINENT HISTORY: Pt reports having pain in the lateral side of his right hip about 8 years and he found relief from a steroid joint injection. It was feeling good up until recently, like 10 days ago. He experiences pain mostly when laying on his right side when sleeping and relief when laying on his left side.  He reports that he also has  right wrist pain that occurred about 10 days ago and it is a constant pain that is located in the anatomical snuff box and that has worsened simultaneously with his right hip pain. He remains relatively active exercising regularly and doing work around his house, and he has been limited in doing this by his right hip pain.   PAIN:  Are you having pain? No  PRECAUTIONS: None  RED FLAGS: None   WEIGHT BEARING RESTRICTIONS: No  FALLS:  Has patient fallen in last 6 months? No  LIVING ENVIRONMENT: Lives with: lives with their family Lives in: House/apartment Stairs: Yes: Internal: 13 steps; on right going up and on left going up and External: 6 steps; on right going  up Has following equipment at home: None and grab bars inside his bathroom    OCCUPATION: Retired   PLOF: Independent  PATIENT GOALS: He wants to know what exercises to do in order to decrease the hip pain.   NEXT MD VISIT: Did not ask    OBJECTIVE:  Note: Objective measures were completed at Evaluation unless otherwise noted.  VITALS BP 144/76 HR 70 SpO2 100%  DIAGNOSTIC FINDINGS:   CLINICAL DATA:  Right hip pain.   EXAM: DG HIP (WITH OR WITHOUT PELVIS) 2-3V RIGHT   COMPARISON:  None Available.   FINDINGS: There is no evidence of hip fracture or dislocation. Mild narrowing of right hip is noted.   IMPRESSION: Mild osteoarthritis of right hip.  No acute abnormality seen.     Electronically Signed   By: Lynwood Landy Raddle M.D.   On: 02/22/2024 12:35    PATIENT SURVEYS:  HOOS Jr   9/28  or 62%  COGNITION: Overall cognitive status: Within functional limits for tasks assessed     SENSATION: WFL  EDEMA: Not performed    MUSCLE LENGTH: Hamstrings: Right 60 deg; Left 60  deg Thomas test: Not performed    PALPATION: Right ASIS TTP    LUMBAR AROM: WNL and no pain elicited   LOWER EXTREMITY ROM:  Active ROM Right eval Left eval  Hip flexion    Hip extension    Hip abduction    Hip adduction    Hip internal rotation    Hip external rotation    Knee flexion    Knee extension    Ankle dorsiflexion    Ankle plantarflexion    Ankle inversion    Ankle eversion     (Blank rows = not tested)  LOWER EXTREMITY MMT:  MMT Right eval Left eval Right  04/03/24 Left  04/03/24 Right  04/17/24 Left  04/17/24  Hip flexion 4-* 4+ 4 4+ 4+ 4+  Hip extension 4 4 4 4 4 4   Hip abduction 4- 4- 4 4    Hip adduction   4 4    Hip internal rotation 4- 4-   4 4 4+ 4+  Hip external rotation 4 4 4 4     Knee flexion 4 4      Knee extension 4 4      Ankle dorsiflexion 4 4      Ankle plantarflexion        Ankle inversion        Ankle eversion         (Blank rows = not  tested)  LOWER EXTREMITY SPECIAL TESTS:  Hip special tests: Belvie (FABER) test: positive , Ely's test: negative, and Anterior hip impingement test: positive   FUNCTIONAL TESTS:  Squat: NT  GAIT: Distance walked: 30 ft   Assistive device utilized: None Level of assistance: Complete Independence Comments: No gait deficits noted                                                                                                                                  TREATMENT DATE:   04/17/24   THEREX  Nu-Step with seat and arms at 7 for 5 min  Hip MMT (See Above)   SLR R/L 75/57   Discussion about exercise progressions including increase resistance to progress when he is able to perform more than 10 reps   Seated Abdominal Crunches 1 x 10   -Pt reports increased chest pain that decreases after stopping activity. Pain radiates across nipple line and feels like pressure. He has felt before at his home.  -Vitals BP 148/85    PATIENT EDUCATION:  Education details: Form and technique for correct performance of exercise and explanation about signs and symptoms of hip OA   Person educated: Patient Education method: Explanation, Demonstration, Verbal cues, and Handouts Education comprehension: verbalized understanding, returned demonstration, and verbal cues required  HOME EXERCISE PROGRAM: Access Code: [email or web address] URL: [email or web address] Date: 04/17/2024 Prepared by: Toribio Servant  Exercises - Seated Table Hamstring Stretch  - 1 x daily - 7 x weekly - 3 reps - 60 sec hold - Seated Table Hamstring Stretch (Mirrored)  - 1 x daily - 7 x weekly - 3 reps - 60 sec hold - Bound Angle Hands Forward   - 1 x daily - 7 x weekly - 3 reps - 60 sec  hold - Modified Thomas Stretch  - 1 x daily - 7 x weekly - 3 reps -  60 secsec hold - Squat with Chair Touch  - 3-4 x weekly - 3 sets - 10 reps - Active Straight Leg Raise with Quad Set  - 3-4 x weekly - 3 sets - 10 reps - Active Straight  Leg Raise with Quad Set (Mirrored)  - 3 x weekly - 3 sets - 10 reps  ASSESSMENT:  CLINICAL IMPRESSION: Pt has now met nearly all of his rehab goals with only some hip extensor weakness remaining. PT recommends that pt setup apt with PCP to address his intermittent chest pain given concern for myocardial infarction. His vitals were elevated and exercises were terminated due to pt's report of chest pain. Chest pain did resolve after stopping activity. He was instructed on how to progress home exercise plan to maintain decreased pain and improvement in right hip function. He is now ready for discharge.   OBJECTIVE IMPAIRMENTS: Abnormal gait, difficulty walking, decreased ROM, decreased strength, hypomobility, impaired flexibility, and pain.   ACTIVITY LIMITATIONS: carrying, lifting, bending, standing, squatting, and sleeping  PARTICIPATION LIMITATIONS: yard work  PERSONAL FACTORS: Age, Fitness, Time since onset of injury/illness/exacerbation, and 1-2 comorbidities: Asthma and Mild Cognitive Impairment are also affecting patient's functional outcome.   REHAB POTENTIAL: Fair chronicity  of condition   CLINICAL DECISION MAKING: Stable/uncomplicated  EVALUATION COMPLEXITY: Low   GOALS: Goals reviewed with patient? No  SHORT TERM GOALS: Target date: 03/20/2024  Patient will demonstrate undestanding of home exercise plan by performing exercises correctly with evidence of good carry over with min to no verbal or tactile cues .   Baseline: NT 03/13/24: Performing independently   Goal status: ACHIEVED      LONG TERM GOALS: Target date: 05/29/2024   Patient will show an improvement in her right hip function as evidenced by an improvement in her HOOS Jr score  >=18 pts.  Baseline: 62% OR 9/28  04/03/24:  81% (3/28)    Goal status: ACHIEVED     2.  Patient will improve right hip strength by  >=1/3 grade on MMT scale (ie 4- to 4) for improved right hip stability and function to return to bending  and stooping to maintain his yard without being limited by pain and discomfort.  Baseline: Hip Flex R/L 4-*/4, Hip Ext R/L4/4, Hip Abd R/L 4-/4-  04/03/24: Hip Flex R/L 4/4, Hip Ext R/L 4/4 , Hip IR R/L 4/4  04/17/24: Hip Flex R/L 4+/4+, Hip IR R/L 4+/4+, Hip Ext R/L 4/4 Goal status: PARTIALLY MET     3.  Patient will decreased right hip pain to <=3/10 when working in his yard as evidence of improved right hip function and tissue healing.  Baseline: 4-6/10 NRPS in ASIS of right hip  04/03/24: 3/10 NRPS in ASIS of right hip   04/10/24: 0/10 NRPS in right hip   Goal status: ACHIEVED     4.  Patient will show improved muscle length with hamstrings for improved pelvic position by being able to perform passive straight leg raise >=80 deg on BLE.  Baseline: SLR R/L 60/60 04/03/24: SLR R/L 75/70  04/10/24:  SLR R/L 75/75 04/17/24: SLR R/L 75/75  Goal status: NOT MET      PLAN:  PT FREQUENCY: 1-2x/week  PT DURATION: 12 weeks  PLANNED INTERVENTIONS: 97164- PT Re-evaluation, 97750- Physical Performance Testing, 97110-Therapeutic exercises, 97530- Therapeutic activity, 97112- Neuromuscular re-education, 97535- Self Care, 02859- Manual therapy, [phone]- Gait training, [phone]- Canalith repositioning, H9716- Electrical stimulation (unattended), [phone]- Electrical stimulation (manual), [id number]- Traction (mechanical), 20560 (1-2 muscles), 20561 (3+ muscles)- Dry Needling, Patient/Family education, Balance training, Stair training, Taping, Joint mobilization, Joint manipulation, Spinal manipulation, Spinal mobilization, DME instructions, Cryotherapy, and Moist heat  PLAN FOR NEXT SESSION: Discharge   Toribio Servant PT, DPT  [facility] [address] [place], [address] Phone: [phone]   Fax:  [phone]

## 2024-04-19 ENCOUNTER — Ambulatory Visit: Admitting: Physical Therapy

## 2024-04-23 ENCOUNTER — Encounter: Admitting: Physical Therapy

## 2024-04-24 ENCOUNTER — Ambulatory Visit: Admitting: Physical Therapy

## 2024-04-24 ENCOUNTER — Encounter: Payer: Self-pay | Admitting: Physician Assistant

## 2024-04-24 ENCOUNTER — Ambulatory Visit: Admitting: Physician Assistant

## 2024-04-24 VITALS — BP 144/77 | HR 77 | Resp 20 | Ht 67.0 in | Wt 163.0 lb

## 2024-04-24 DIAGNOSIS — G3184 Mild cognitive impairment, so stated: Secondary | ICD-10-CM | POA: Diagnosis not present

## 2024-04-24 MED ORDER — DONEPEZIL HCL 10 MG PO TABS: ORAL_TABLET | ORAL | 3 refills | Status: DC

## 2024-04-24 NOTE — Progress Notes (Signed)
 Assessment/Plan:    Mild cognitive impairment with memory loss  Kyle Sanchez is a very pleasant 78 y.o. RH male with a history of hypertension, hyperlipidemia, GERD, mild IBS, anxiety, and a diagnosis of mild cognitive impairment with memory loss by neuropsych evaluation July 2025 presenting today in follow-up after his neurocognitive testing. Memory is stable. Patient is able to participate on ADLs and to drive without difficulties.  He is yet to have an MRI of the brain. Discussed the importance performing the procedure, to better visualization any structural abnormalities and the vascular load. He continues to decline.  Discussed starting donepezil 10 mg daily as directed patient agrees to proceed.  This patient is accompanied in the office by his wife.  Previous records as well as any outside records available were reviewed prior to todays visit. Patient was last seen on 01/19/2024 with [facility] 16/30.   Recommendations:   Follow up in 6  months. Start Donepezil 10 mg :Take half tablet (5 mg) daily for 2 weeks, then increase to the full tablet at 10 mg daily. Side effects discussed   Recommend performing  MRI of the brain to further evaluate for structural abnormalities and vascular load Recommend good control of cardiovascular risk factors Continue to control mood as per PCP    Neuropsych evaluation 01/23/2024, Dr. Richie briefly, results suggested severe impairment surrounding all aspects of verbal learning and memory. An additional weakness was exhibited across semantic fluency, while performance variability was exhibited across basic attention. Outside of an isolated difficulty across a line orientation task, visuospatial abilities were intact. The cause for memory dysfunction remains uncertain at the present time. It is important to highlight that, while being fluent in [place], Kyle Sanchez was tested in his non-native language which can certainly create artifical suppressions in verbally mediated  tasks. This would include verbal learning and memory and semantic fluency. Outside of this, the very early beginnings of a neurodegenerative illness such as Alzheimer's disease cannot be ruled out. Kyle Sanchez was fully amnestic (i.e., 0% retention) after brief delays across both list and story-based tasks. He also performed poorly across recognition trials, suggesting some evidence for rapid forgetting and an evolving storage impairment. 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 A weakness in semantic fluency would also reflect typical illness progression. Importantly, it remains encouraging that visual retention/recognition and confrontation naming remained intact. While the presence of this illness cannot be ruled out, it should not be ruled in with confidence based upon presently available information. Continued medical monitoring will be important moving forward.   Initial visit January 19 2024 How long did patient have memory difficulties?  For the last year.  Patient reports some difficulty remembering new information, recent conversations, names.  He has to write down to remember.  Notices more difficulties with multitasking. He is concerned because he has strong family of dementia  repeats oneself?  Endorsed Disoriented when walking into a room? Denies    Leaving objects in unusual places?  Denies.   Wandering behavior? Denies.   Any personality changes, or depression, anxiety? Denies   Hallucinations or paranoia? Denies.   Seizures? Denies.    Any sleep changes?  Sleeps well. Denies frequent nightmares or dream reenactment, other REM behavior or sleepwalking   Sleep apnea? Denies.   Any hygiene concerns?  Denies.   Independent of bathing and dressing? Endorsed  Does the patient need help with medications? I am on no medicines   Who is in charge of the finances?  Wife is in charge, she is the brain     Any changes in appetite?   Denies.     Patient have trouble swallowing?   Denies.   Does the patient cook? Yes, denies forgetting common recipes or kitchen accidents   Any headaches?  Denies.   Chronic pain?  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 Ambulates with difficulty? Denies. Walks 3 miles a day, treadmill.  Recent falls or head injuries? Denies.     Vision changes?  Denies any new issues.    Any strokelike symptoms? Denies.   Any tremors? Denies.   Any anosmia? Denies.   Any incontinence of urine? Denies.   Any bowel dysfunction? Denies.      Patient lives with Wife    History of heavy alcohol intake? Denies.   History of heavy tobacco use? Denies.   Family history of dementia?  Mother and 2 siblings have dementia of AD. His brother has LBD Does patient drive? Yes, denies getting lost.   Retired, he Patent examiner, at [employer].    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   Heart murmur    Hemorrhoids 06/27/2008   Hiatal hernia    1 cm hill grade 2    History of adenomatous colonic polyps 2004   History of migraines    Hyperlipidemia 09/27/2006   Hypertension    IBS (irritable bowel syndrome)    Knee pain 05/08/2023   Mild cognitive impairment with memory loss 01/23/2024   Neck pain 01/09/2023   Obstructive sleep apnea 09/27/2006   Osteoarthritis    Other fatigue 01/09/2023   Skin lesion 03/16/2023   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     Past Surgical History:  Procedure Laterality Date   CATARACT EXTRACTION, BILATERAL  2021   CHOLECYSTECTOMY     colonoscopy and endoscopy     COLONOSCOPY WITH PROPOFOL  N/A 07/26/2018   Procedure: COLONOSCOPY WITH PROPOFOL ;  Surgeon: Toledo, Ladell POUR, MD;  Location: [facility] ENDOSCOPY;  Service: Gastroenterology;  Laterality: N/A;   ESOPHAGOGASTRODUODENOSCOPY (EGD) WITH PROPOFOL  N/A 07/26/2018    Procedure: ESOPHAGOGASTRODUODENOSCOPY (EGD) WITH PROPOFOL ;  Surgeon: Toledo, Ladell POUR, MD;  Location: [facility] ENDOSCOPY;  Service: Gastroenterology;  Laterality: N/A;     PREVIOUS MEDICATIONS:   CURRENT MEDICATIONS:  Outpatient Encounter Medications as of 04/24/2024  Medication Sig   aspirin EC 81 MG tablet Take 81 mg by mouth daily.   calcium citrate (CALCITRATE - DOSED IN MG ELEMENTAL CALCIUM) 950 (200 Ca) MG tablet Take 200 mg of elemental calcium by mouth daily. With zinc and magnesium.   Cholecalciferol  25 MCG (1000 UT) capsule Take 4 capsules (4,000 Units total) by mouth daily.   clobetasol  cream (TEMOVATE ) 0.05 % Apply 1 application topically 2 (two) times daily as needed.   Ginkgo Biloba 120 MG TABS Take by mouth daily.   glucosamine-chondroitin 500-400 MG tablet Take 1 tablet by mouth daily.   lactobacillus acidophilus (BACID) TABS tablet Take 1 tablet by mouth daily.   meloxicam  (MOBIC ) 7.5 MG tablet Take 1 tablet (7.5 mg total) by mouth 2 (two) times daily as needed for pain (hold while taking prednisone .).   Multiple Vitamin (MULTIVITAMIN) capsule Take 1 capsule by mouth daily.   Omega-3 Fatty Acids (  RA FISH OIL) 1400 MG CPDR Take by mouth daily.   polyethylene glycol (MIRALAX / GLYCOLAX) 17 g packet Take 17 g by mouth daily.   predniSONE  (DELTASONE ) 20 MG tablet Take 2 a day for 5 days, then 1 a day for 5 days, with food. Don't take with aleve/ibuprofen/meloxicam . (Patient not taking: Reported on 04/12/2024)   Red Yeast Rice Extract (RED YEAST RICE PO) Take 1 tablet by mouth daily.   Turmeric 500 MG CAPS Take 500 mg by mouth in the morning and at bedtime.   No facility-administered encounter medications on file as of 04/24/2024.       01/19/2024    2:00 PM  Montreal Cognitive Assessment   Visuospatial/ Executive (0/5) 1  Naming (0/3) 3  Attention: Read list of digits (0/2) 2  Attention: Read list of letters (0/1) 1  Attention: Serial 7 subtraction starting at 100 (0/3) 1   Language: Repeat phrase (0/2) 0  Language : Fluency (0/1) 1  Abstraction (0/2) 0  Delayed Recall (0/5) 1  Orientation (0/6) 5  Total 15  Adjusted Score (based on education) 16        No data to display             Thank you for allowing us  the opportunity to participate in the care of this nice patient. Please do not hesitate to contact us  for any questions or concerns.   Total time spent on today's visit was 34 minutes dedicated to this patient today, preparing to see patient, examining the patient, ordering tests and/or medications and counseling the patient, documenting clinical information in the EHR or other health record, independently interpreting results and communicating results to the patient/family, discussing treatment and goals, answering patient's questions and coordinating care.  Cc:  Cleatus Arlyss RAMAN, MD  Camie Sevin 04/24/2024 3:36 PM

## 2024-04-24 NOTE — Patient Instructions (Signed)
 It was a pleasure to see you today at our office.   Recommendations:  Follow up in 6 months  We will start donepezil half tablet (5mg ) daily for 2  weeks.  If you are tolerating the medication, then after 2 weeks, we will increase the dose to a full tablet of 10 mg daily.    [email or web address]-[email or web address]   RECOMMENDATIONS FOR ALL PATIENTS WITH MEMORY PROBLEMS: 1. Continue to exercise (Recommend 30 minutes of walking everyday, or 3 hours every week) 2. Increase social interactions - continue going to [place] and enjoy social gatherings with friends and family 3. Eat healthy, avoid fried foods and eat more fruits and vegetables 4. Maintain adequate blood pressure, blood sugar, and blood cholesterol level. Reducing the risk of stroke and cardiovascular disease also helps promoting better memory. 5. Avoid stressful situations. Live a simple life and avoid aggravations. Organize your time and prepare for the next day in anticipation. 6. Sleep well, avoid any interruptions of sleep and avoid any distractions in the bedroom that may interfere with adequate sleep quality 7. Avoid sugar, avoid sweets as there is a strong link between excessive sugar intake, diabetes, and cognitive impairment We discussed the Mediterranean diet, which has been shown to help patients reduce the risk of progressive memory disorders and reduces cardiovascular risk. This includes eating fish, eat fruits and green leafy vegetables, nuts like almonds and hazelnuts, walnuts, and also use olive oil. Avoid fast foods and fried foods as much as possible. Avoid sweets and sugar as sugar use has been linked to worsening of memory function.  There is always a concern of gradual progression of memory problems. If this is the case, then we may need to adjust level of care according to patient needs. Support, both to the patient and caregiver, should then be put into place.            DRIVING: Regarding driving, in patients with progressive memory problems, driving will be impaired. We advise to have someone else do the driving if trouble finding directions or if minor accidents are reported. Independent driving assessment is available to determine safety of driving.   If you are interested in the driving assessment, you can contact the following:  [facility] [employer] [facility] in [place] [phone]  [facility] [phone]  [facility] [phone]  [facility] [phone] or [phone]   FALL PRECAUTIONS: Be cautious when walking. Scan the area for obstacles that may increase the risk of trips and falls. When getting up in the mornings, sit up at the edge of the bed for a few minutes before getting out of bed. Consider elevating the bed at the head end to avoid drop of blood pressure when getting up. Walk always in a well-lit room (use night lights in the walls). Avoid area rugs or power cords from appliances in the middle of the walkways. Use a walker or a cane if necessary and consider physical therapy for balance exercise. Get your eyesight checked regularly.  FINANCIAL OVERSIGHT: Supervision, especially oversight when making financial decisions or transactions is also recommended.  HOME SAFETY: Consider the safety of the kitchen when operating appliances like stoves, microwave oven, and blender. Consider having supervision and share cooking responsibilities until no longer able to participate in those. Accidents with firearms and other hazards in the house should be identified and addressed as well.   ABILITY TO BE LEFT ALONE: If patient is unable to contact 911 operator, consider using [employer], or when the need is  there, arrange for someone to stay with patients. Smoking is a fire hazard, consider supervision or cessation. Risk of wandering should be assessed by caregiver and if detected at any point, supervision and safe  proof recommendations should be instituted.  MEDICATION SUPERVISION: Inability to self-administer medication needs to be constantly addressed. Implement a mechanism to ensure safe administration of the medications.      Mediterranean Diet A Mediterranean diet refers to food and lifestyle choices that are based on the traditions of countries located on the [employer] [place]. This way of eating has been shown to help prevent certain conditions and improve outcomes for people who have chronic diseases, like kidney disease and heart disease. What are tips for following this plan? Lifestyle  Cook and eat meals together with your family, when possible. Drink enough fluid to keep your urine clear or pale yellow. Be physically active every day. This includes: Aerobic exercise like running or swimming. Leisure activities like gardening, walking, or housework. Get 7-8 hours of sleep each night. If recommended by your health care provider, drink red wine in moderation. This means 1 glass a day for nonpregnant women and 2 glasses a day for men. A glass of wine equals 5 oz (150 mL). Reading food labels  Check the serving size of packaged foods. For foods such as rice and pasta, the serving size refers to the amount of cooked product, not dry. Check the total fat in packaged foods. Avoid foods that have saturated fat or trans fats. Check the ingredients list for added sugars, such as corn syrup. Shopping  At the grocery store, buy most of your food from the areas near the walls of the store. This includes: Fresh fruits and vegetables (produce). Grains, beans, nuts, and seeds. Some of these may be available in unpackaged forms or large amounts (in bulk). Fresh seafood. Poultry and eggs. Low-fat dairy products. Buy whole ingredients instead of prepackaged foods. Buy fresh fruits and vegetables in-season from local farmers markets. Buy frozen fruits and vegetables in resealable bags. If you do not  have access to quality fresh seafood, buy precooked frozen shrimp or canned fish, such as tuna, salmon, or sardines. Buy small amounts of raw or cooked vegetables, salads, or olives from the deli or salad bar at your store. Stock your pantry so you always have certain foods on hand, such as olive oil, canned tuna, canned tomatoes, rice, pasta, and beans. Cooking  Cook foods with extra-virgin olive oil instead of using butter or other vegetable oils. Have meat as a side dish, and have vegetables or grains as your main dish. This means having meat in small portions or adding small amounts of meat to foods like pasta or stew. Use beans or vegetables instead of meat in common dishes like chili or lasagna. Experiment with different cooking methods. Try roasting or broiling vegetables instead of steaming or sauteing them. Add frozen vegetables to soups, stews, pasta, or rice. Add nuts or seeds for added healthy fat at each meal. You can add these to yogurt, salads, or vegetable dishes. Marinate fish or vegetables using olive oil, lemon juice, garlic, and fresh herbs. Meal planning  Plan to eat 1 vegetarian meal one day each week. Try to work up to 2 vegetarian meals, if possible. Eat seafood 2 or more times a week. Have healthy snacks readily available, such as: Vegetable sticks with hummus. Greek yogurt. Fruit and nut trail mix. Eat balanced meals throughout the week. This includes: Fruit: 2-3 servings a day  Vegetables: 4-5 servings a day Low-fat dairy: 2 servings a day Fish, poultry, or lean meat: 1 serving a day Beans and legumes: 2 or more servings a week Nuts and seeds: 1-2 servings a day Whole grains: 6-8 servings a day Extra-virgin olive oil: 3-4 servings a day Limit red meat and sweets to only a few servings a month What are my food choices? Mediterranean diet Recommended Grains: Whole-grain pasta. Brown rice. Bulgar wheat. Polenta. Couscous. Whole-wheat bread. [place] Made[place]. Vegetables: Artichokes. Beets. Broccoli. Cabbage. Carrots. Eggplant. Green beans. Chard. Kale. Spinach. Onions. Leeks. Peas. Squash. Tomatoes. Peppers. Radishes. Fruits: Apples. Apricots. Avocado. Berries. Bananas. Cherries. Dates. Figs. Grapes. Lemons. Melon. Oranges. Peaches. Plums. Pomegranate. Meats and other protein foods: Beans. Almonds. Sunflower seeds. Pine nuts. Peanuts. Cod. Salmon. Scallops. Shrimp. Tuna. Tilapia. Clams. Oysters. Eggs. Dairy: Low-fat milk. Cheese. Greek yogurt. Beverages: Water. Red wine. Herbal tea. Fats and oils: Extra virgin olive oil. Avocado oil. Grape seed oil. Sweets and desserts: [place] yogurt with honey. Baked apples. Poached pears. Trail mix. Seasoning and other foods: Basil. Cilantro. Coriander. Cumin. Mint. Parsley. Sage. Rosemary. Tarragon. Garlic. Oregano. Thyme. Pepper. Balsalmic vinegar. Tahini. Hummus. Tomato sauce. Olives. Mushrooms. Limit these Grains: Prepackaged pasta or rice dishes. Prepackaged cereal with added sugar. Vegetables: Deep fried potatoes (french fries). Fruits: Fruit canned in syrup. Meats and other protein foods: Beef. Pork. Lamb. Poultry with skin. Hot dogs. [place]. Dairy: Ice cream. Sour cream. Whole milk. Beverages: Juice. Sugar-sweetened soft drinks. Beer. Liquor and spirits. Fats and oils: Butter. Canola oil. Vegetable oil. Beef fat (tallow). Lard. Sweets and desserts: Cookies. Cakes. Pies. Candy. Seasoning and other foods: Mayonnaise. Premade sauces and marinades. The items listed may not be a complete list. Talk with your dietitian about what dietary choices are right for you. Summary The Mediterranean diet includes both food and lifestyle choices. Eat a variety of fresh fruits and vegetables, beans, nuts, seeds, and whole grains. Limit the amount of red meat and sweets that you eat. Talk with your health care provider about whether it is safe for you to drink red wine in moderation. This means 1 glass a day for  nonpregnant women and 2 glasses a day for men. A glass of wine equals 5 oz (150 mL). This information is not intended to replace advice given to you by your health care provider. Make sure you discuss any questions you have with your health care provider. Document Released: 02/19/2016 Document Revised: 03/23/2016 Document Reviewed: 02/19/2016 Elsevier Interactive Patient Education  2017 [employer].

## 2024-04-25 ENCOUNTER — Encounter: Admitting: Physical Therapy

## 2024-04-26 ENCOUNTER — Ambulatory Visit: Admitting: Physical Therapy

## 2024-04-26 DIAGNOSIS — M25551 Pain in right hip: Secondary | ICD-10-CM

## 2024-04-26 NOTE — Therapy (Signed)
 Note entered in error. Pt was still on schedule and he came to apt. PT reminded pt that he had been discharged last visit and apologized for his appointments still be on schedule. PT had apts removed and instructed pt to continue his home exercise plan.

## 2024-04-30 ENCOUNTER — Ambulatory Visit: Admitting: Physical Therapy

## 2024-05-01 ENCOUNTER — Encounter: Payer: Self-pay | Admitting: Family Medicine

## 2024-05-01 ENCOUNTER — Ambulatory Visit (INDEPENDENT_AMBULATORY_CARE_PROVIDER_SITE_OTHER): Admitting: Family Medicine

## 2024-05-01 VITALS — BP 134/76 | HR 63 | Temp 98.4°F | Ht 67.0 in | Wt 163.1 lb

## 2024-05-01 DIAGNOSIS — M19049 Primary osteoarthritis, unspecified hand: Secondary | ICD-10-CM | POA: Diagnosis not present

## 2024-05-01 DIAGNOSIS — G3184 Mild cognitive impairment, so stated: Secondary | ICD-10-CM

## 2024-05-01 MED ORDER — PREDNISONE 20 MG PO TABS: ORAL_TABLET | ORAL | 0 refills | Status: DC

## 2024-05-01 NOTE — Progress Notes (Unsigned)
 Hand pain and itchy rash.  No fevers.  No trauma.  GERD hx noted.  R wrist and thumb can get puffy.  He has chronic IP joint changes.  Prev pain with making a fist.  He was unclear about prev benefit with prednisone .    Memory d/w pt.  He couldn't tolerate donepezil.  Med list updated.  He had claustrophobia re: MRI.  He had chest discomfort due to claustrophobia- no chest discomfort otherwise.  He couldn't tolerate MRI with sedation.    Discussed that I would ask neuro for input re: MRI options and about donepezil intolerance.    Meds, vitals, and allergies reviewed.   ROS: Per HPI unless specifically indicated in ROS section   Nad Ncat Neck supple, no LA Rrr Ctab Abd soft not ttp Blanching maculopapular rash on the R forearm, flexor side.  Similar rash on the B ankles.   Chronic hand IP joint changes noted.  30 minutes were devoted to patient care in this encounter (this includes time spent reviewing the patient's file/history, interviewing and examining the patient, counseling/reviewing plan with patient).

## 2024-05-01 NOTE — Patient Instructions (Addendum)
 I'll check with neurology in the meantime.  Restart the prednisone  and we'll check with you in about 3 days to see about the itching swelling and pain.   Take care.  Glad to see you.

## 2024-05-02 ENCOUNTER — Telehealth: Payer: Self-pay | Admitting: Family Medicine

## 2024-05-02 ENCOUNTER — Encounter: Admitting: Physical Therapy

## 2024-05-02 NOTE — Telephone Encounter (Signed)
 Please get update on patient about rash, itching, joint pain with prednisone  use.  Thanks.

## 2024-05-02 NOTE — Assessment & Plan Note (Signed)
 Discussed that I would ask neuro for input re: MRI options and about donepezil intolerance.

## 2024-05-02 NOTE — Assessment & Plan Note (Signed)
 He has a concurrent issue rash but it does not look typical for psoriasis.  Discussed options.  I would retry prednisone  and we will check with patient in a few days to see about his response to the medication.

## 2024-05-03 DIAGNOSIS — H26493 Other secondary cataract, bilateral: Secondary | ICD-10-CM | POA: Diagnosis not present

## 2024-05-03 DIAGNOSIS — B8801 Infestation by demodex mites: Secondary | ICD-10-CM | POA: Diagnosis not present

## 2024-05-03 DIAGNOSIS — H35372 Puckering of macula, left eye: Secondary | ICD-10-CM | POA: Diagnosis not present

## 2024-05-03 DIAGNOSIS — H21233 Degeneration of iris (pigmentary), bilateral: Secondary | ICD-10-CM | POA: Diagnosis not present

## 2024-05-03 DIAGNOSIS — H353132 Nonexudative age-related macular degeneration, bilateral, intermediate dry stage: Secondary | ICD-10-CM | POA: Diagnosis not present

## 2024-05-03 DIAGNOSIS — H01009 Unspecified blepharitis unspecified eye, unspecified eyelid: Secondary | ICD-10-CM | POA: Diagnosis not present

## 2024-05-03 DIAGNOSIS — H524 Presbyopia: Secondary | ICD-10-CM | POA: Diagnosis not present

## 2024-05-03 NOTE — Telephone Encounter (Signed)
 Spoke with pt asking about sxs. States he is doing much, much better. Will continue prednisone .

## 2024-05-03 NOTE — Telephone Encounter (Signed)
Spoke with pt relaying Dr. Josefine Class message.  Pt verbalizes understanding.

## 2024-05-03 NOTE — Telephone Encounter (Signed)
 Noted, thanks.  If he has quick return of sx after finishing the prednisone , then please let us  know.  Thanks.

## 2024-05-07 ENCOUNTER — Ambulatory Visit: Admitting: Physical Therapy

## 2024-05-09 ENCOUNTER — Encounter: Admitting: Physical Therapy

## 2024-05-11 ENCOUNTER — Other Ambulatory Visit: Payer: Self-pay | Admitting: Family Medicine

## 2024-05-11 DIAGNOSIS — E785 Hyperlipidemia, unspecified: Secondary | ICD-10-CM

## 2024-05-11 DIAGNOSIS — R413 Other amnesia: Secondary | ICD-10-CM

## 2024-05-11 DIAGNOSIS — Z125 Encounter for screening for malignant neoplasm of prostate: Secondary | ICD-10-CM

## 2024-05-17 ENCOUNTER — Other Ambulatory Visit (INDEPENDENT_AMBULATORY_CARE_PROVIDER_SITE_OTHER)

## 2024-05-17 DIAGNOSIS — R413 Other amnesia: Secondary | ICD-10-CM

## 2024-05-17 DIAGNOSIS — Z125 Encounter for screening for malignant neoplasm of prostate: Secondary | ICD-10-CM

## 2024-05-17 DIAGNOSIS — E785 Hyperlipidemia, unspecified: Secondary | ICD-10-CM | POA: Diagnosis not present

## 2024-05-17 LAB — LIPID PANEL
Cholesterol: 219 mg/dL — ABNORMAL HIGH (ref 0–200)
HDL: 57.8 mg/dL (ref >39.00)
LDL Cholesterol: 142 mg/dL — ABNORMAL HIGH (ref 0–99)
NonHDL: 161.13
Total CHOL/HDL Ratio: 4
Triglycerides: 96 mg/dL (ref 0.0–149.0)
VLDL: 19.2 mg/dL (ref 0.0–40.0)

## 2024-05-17 LAB — CBC WITH DIFFERENTIAL/PLATELET
Basophils Absolute: 0.1 K/uL (ref 0.0–0.1)
Basophils Relative: 1.8 % (ref 0.0–3.0)
Eosinophils Absolute: 0.4 K/uL (ref 0.0–0.7)
Eosinophils Relative: 5.3 % — ABNORMAL HIGH (ref 0.0–5.0)
HCT: 43.8 % (ref 39.0–52.0)
Hemoglobin: 14.5 g/dL (ref 13.0–17.0)
Lymphocytes Relative: 32.3 % (ref 12.0–46.0)
Lymphs Abs: 2.2 K/uL (ref 0.7–4.0)
MCHC: 33.2 g/dL (ref 30.0–36.0)
MCV: 86.7 fl (ref 78.0–100.0)
Monocytes Absolute: 0.7 K/uL (ref 0.1–1.0)
Monocytes Relative: 10 % (ref 3.0–12.0)
Neutro Abs: 3.4 K/uL (ref 1.4–7.7)
Neutrophils Relative %: 50.6 % (ref 43.0–77.0)
Platelets: 221 K/uL (ref 150.0–400.0)
RBC: 5.05 Mil/uL (ref 4.22–5.81)
RDW: 17.5 % — ABNORMAL HIGH (ref 11.5–15.5)
WBC: 6.8 K/uL (ref 4.0–10.5)

## 2024-05-17 LAB — COMPREHENSIVE METABOLIC PANEL WITH GFR
ALT: 23 U/L (ref 0–53)
AST: 23 U/L (ref 0–37)
Albumin: 4.2 g/dL (ref 3.5–5.2)
Alkaline Phosphatase: 68 U/L (ref 39–117)
BUN: 20 mg/dL (ref 6–23)
CO2: 33 meq/L — ABNORMAL HIGH (ref 19–32)
Calcium: 9.7 mg/dL (ref 8.4–10.5)
Chloride: 102 meq/L (ref 96–112)
Creatinine, Ser: 0.87 mg/dL (ref 0.40–1.50)
GFR: 82 mL/min (ref >60.00)
Glucose, Bld: 87 mg/dL (ref 70–99)
Potassium: 4.6 meq/L (ref 3.5–5.1)
Sodium: 143 meq/L (ref 135–145)
Total Bilirubin: 0.6 mg/dL (ref 0.2–1.2)
Total Protein: 6.8 g/dL (ref 6.0–8.3)

## 2024-05-17 LAB — TSH: TSH: 3.12 u[IU]/mL (ref 0.35–5.50)

## 2024-05-17 LAB — PSA, MEDICARE: PSA: 2.65 ng/mL (ref 0.10–4.00)

## 2024-05-17 LAB — VITAMIN B12: Vitamin B-12: 978 pg/mL — ABNORMAL HIGH (ref 211–911)

## 2024-05-20 ENCOUNTER — Ambulatory Visit: Payer: Self-pay | Admitting: Family Medicine

## 2024-05-21 ENCOUNTER — Encounter: Payer: Self-pay | Admitting: Family Medicine

## 2024-05-21 ENCOUNTER — Ambulatory Visit (INDEPENDENT_AMBULATORY_CARE_PROVIDER_SITE_OTHER): Admitting: Family Medicine

## 2024-05-21 VITALS — BP 124/68 | HR 62 | Temp 98.2°F | Ht 66.25 in | Wt 164.2 lb

## 2024-05-21 DIAGNOSIS — Z7189 Other specified counseling: Secondary | ICD-10-CM

## 2024-05-21 DIAGNOSIS — Z0000 Encounter for general adult medical examination without abnormal findings: Secondary | ICD-10-CM

## 2024-05-21 DIAGNOSIS — G3184 Mild cognitive impairment, so stated: Secondary | ICD-10-CM | POA: Diagnosis not present

## 2024-05-21 DIAGNOSIS — M19049 Primary osteoarthritis, unspecified hand: Secondary | ICD-10-CM

## 2024-05-21 DIAGNOSIS — M79641 Pain in right hand: Secondary | ICD-10-CM

## 2024-05-21 MED ORDER — MEMANTINE HCL 5 MG PO TABS
ORAL_TABLET | ORAL | 1 refills | Status: AC
Start: 2024-05-21 — End: ?

## 2024-05-21 MED ORDER — DICLOFENAC SODIUM 1 % EX GEL: 2.0000 g | Freq: Four times a day (QID) | CUTANEOUS | Status: AC | PRN

## 2024-05-21 NOTE — Progress Notes (Unsigned)
 Itching and joint pain improved on prednisone .  No itching now.  Some R wrist pain at baseline, pain with grip.  D/w pt about trial of voltaren  gel and seeing ortho.   He had recurrent itching when the pain is worse.    Memory d/w pt.  D/w pt about namenda start.  Rx sent.  I will update neurology- [facility].  He didn't tolerate donepezil.    Flu 2025 Shingles up-to-date PNA up-to-date Tetanus 2006, discussed with patient.  It may be cheaper the pharmacy. COVID-vaccine previously done Colonoscopy 2021 Prostate cancer screening 2025 Advance directive-wife designated if patient were incapacitated.  Meds, vitals, and allergies reviewed.   ROS: Per HPI unless specifically indicated in ROS section   GEN: nad, alert and oriented HEENT: ncat NECK: supple w/o LA CV: rrr. PULM: ctab, no inc wob ABD: soft, +bs EXT: no edema SKIN: well perfused.   31 minutes were devoted to patient care in this encounter (this includes time spent reviewing the patient's file/history, interviewing and examining the patient, counseling/reviewing plan with patient).

## 2024-05-21 NOTE — Patient Instructions (Addendum)
 Try diclofenac  gel on your wrist and see if that helps.   Let me know if you can't get set up with ortho in [place].  Take care.  Glad to see you. Ask the pharmacy about RSV and tetanus.    Start namenda.  1 a day for 1 week.  1 twice a day for 1 week.  2 in the AM and 1 in the PM for 1 week.  2 twice a day after that.  Update me in about 1 month, sooner if needed.   Take care.  Glad to see you.

## 2024-05-23 DIAGNOSIS — Z0000 Encounter for general adult medical examination without abnormal findings: Secondary | ICD-10-CM | POA: Insufficient documentation

## 2024-05-23 DIAGNOSIS — Z7189 Other specified counseling: Secondary | ICD-10-CM | POA: Insufficient documentation

## 2024-05-23 NOTE — Assessment & Plan Note (Signed)
 D/w pt about trial of voltaren  gel and seeing ortho.

## 2024-05-23 NOTE — Assessment & Plan Note (Signed)
 Flu 2025 Shingles up-to-date PNA up-to-date Tetanus 2006, discussed with patient.  It may be cheaper the pharmacy. COVID-vaccine previously done Colonoscopy 2021 Prostate cancer screening 2025 Advance directive-wife designated if patient were incapacitated.

## 2024-05-23 NOTE — Assessment & Plan Note (Signed)
 D/w pt about namenda start.  Rx sent.  I will update neurology- [facility]mann.   See AVS.  I asked him to update me as needed.

## 2024-05-23 NOTE — Assessment & Plan Note (Signed)
 Advance directive- wife designated if patient were incapacitated.

## 2024-07-17 ENCOUNTER — Ambulatory Visit: Admitting: Physician Assistant

## 2024-10-23 ENCOUNTER — Ambulatory Visit: Admitting: Physician Assistant

## 2025-04-16 ENCOUNTER — Ambulatory Visit
